# Patient Record
Sex: Female | Born: 1935 | Race: White | Hispanic: No | Smoking: Former smoker
Health system: Southern US, Community
[De-identification: ages and names within clinical notes are randomized; demographics above are authoritative.]

## PROBLEM LIST (undated history)

## (undated) DIAGNOSIS — K449 Diaphragmatic hernia without obstruction or gangrene: Secondary | ICD-10-CM

## (undated) DIAGNOSIS — K219 Gastro-esophageal reflux disease without esophagitis: Secondary | ICD-10-CM

## (undated) DIAGNOSIS — R0602 Shortness of breath: Secondary | ICD-10-CM

## (undated) DIAGNOSIS — K5790 Diverticulosis of intestine, part unspecified, without perforation or abscess without bleeding: Secondary | ICD-10-CM

## (undated) DIAGNOSIS — E559 Vitamin D deficiency, unspecified: Secondary | ICD-10-CM

## (undated) DIAGNOSIS — E039 Hypothyroidism, unspecified: Secondary | ICD-10-CM

## (undated) DIAGNOSIS — D510 Vitamin B12 deficiency anemia due to intrinsic factor deficiency: Secondary | ICD-10-CM

## (undated) DIAGNOSIS — R739 Hyperglycemia, unspecified: Secondary | ICD-10-CM

## (undated) DIAGNOSIS — I10 Essential (primary) hypertension: Secondary | ICD-10-CM

## (undated) DIAGNOSIS — E7800 Pure hypercholesterolemia, unspecified: Secondary | ICD-10-CM

## (undated) DIAGNOSIS — J449 Chronic obstructive pulmonary disease, unspecified: Secondary | ICD-10-CM

## (undated) DIAGNOSIS — M810 Age-related osteoporosis without current pathological fracture: Secondary | ICD-10-CM

## (undated) DIAGNOSIS — K279 Peptic ulcer, site unspecified, unspecified as acute or chronic, without hemorrhage or perforation: Secondary | ICD-10-CM

## (undated) DIAGNOSIS — M1990 Unspecified osteoarthritis, unspecified site: Secondary | ICD-10-CM

## (undated) DIAGNOSIS — J45909 Unspecified asthma, uncomplicated: Secondary | ICD-10-CM

## (undated) HISTORY — DX: Hyperglycemia, unspecified: R73.9

## (undated) HISTORY — PX: LUMBAR FUSION: SHX111

## (undated) HISTORY — DX: Diverticulosis of intestine, part unspecified, without perforation or abscess without bleeding: K57.90

## (undated) HISTORY — DX: Pure hypercholesterolemia, unspecified: E78.00

## (undated) HISTORY — PX: ABDOMINAL HYSTERECTOMY: SHX81

## (undated) HISTORY — DX: Chronic obstructive pulmonary disease, unspecified: J44.9

## (undated) HISTORY — DX: Vitamin B12 deficiency anemia due to intrinsic factor deficiency: D51.0

## (undated) HISTORY — DX: Hypothyroidism, unspecified: E03.9

## (undated) HISTORY — DX: Vitamin D deficiency, unspecified: E55.9

## (undated) HISTORY — DX: Peptic ulcer, site unspecified, unspecified as acute or chronic, without hemorrhage or perforation: K27.9

## (undated) HISTORY — DX: Gastro-esophageal reflux disease without esophagitis: K21.9

## (undated) HISTORY — DX: Age-related osteoporosis without current pathological fracture: M81.0

## (undated) HISTORY — DX: Unspecified osteoarthritis, unspecified site: M19.90

## (undated) HISTORY — DX: Diaphragmatic hernia without obstruction or gangrene: K44.9

## (undated) HISTORY — DX: Shortness of breath: R06.02

## (undated) HISTORY — DX: Essential (primary) hypertension: I10

## (undated) HISTORY — DX: Unspecified asthma, uncomplicated: J45.909

---

## 2004-09-23 ENCOUNTER — Ambulatory Visit: Payer: Self-pay | Admitting: Internal Medicine

## 2005-09-28 ENCOUNTER — Ambulatory Visit: Payer: Self-pay | Admitting: Internal Medicine

## 2006-11-22 ENCOUNTER — Ambulatory Visit: Payer: Self-pay | Admitting: Internal Medicine

## 2007-09-06 ENCOUNTER — Ambulatory Visit: Payer: Self-pay | Admitting: Unknown Physician Specialty

## 2008-01-29 ENCOUNTER — Ambulatory Visit: Payer: Self-pay | Admitting: Internal Medicine

## 2008-01-31 ENCOUNTER — Ambulatory Visit: Payer: Self-pay | Admitting: Internal Medicine

## 2009-02-11 ENCOUNTER — Ambulatory Visit: Payer: Self-pay | Admitting: Internal Medicine

## 2009-07-16 ENCOUNTER — Ambulatory Visit: Payer: Self-pay

## 2009-07-30 ENCOUNTER — Ambulatory Visit: Payer: Self-pay | Admitting: Pain Medicine

## 2009-08-14 ENCOUNTER — Ambulatory Visit: Payer: Self-pay | Admitting: Physician Assistant

## 2009-09-02 ENCOUNTER — Ambulatory Visit: Payer: Self-pay | Admitting: Pain Medicine

## 2009-09-18 ENCOUNTER — Ambulatory Visit: Payer: Self-pay | Admitting: Physician Assistant

## 2010-02-13 ENCOUNTER — Ambulatory Visit: Payer: Self-pay | Admitting: Internal Medicine

## 2010-02-23 ENCOUNTER — Ambulatory Visit: Payer: Self-pay | Admitting: Pain Medicine

## 2010-03-03 ENCOUNTER — Ambulatory Visit: Payer: Self-pay | Admitting: Pain Medicine

## 2010-03-18 ENCOUNTER — Ambulatory Visit: Payer: Self-pay | Admitting: Pain Medicine

## 2010-04-23 ENCOUNTER — Ambulatory Visit: Payer: Self-pay | Admitting: Pain Medicine

## 2010-05-07 ENCOUNTER — Ambulatory Visit: Payer: Self-pay | Admitting: Pain Medicine

## 2010-10-16 ENCOUNTER — Ambulatory Visit: Payer: Self-pay | Admitting: Internal Medicine

## 2011-02-15 ENCOUNTER — Ambulatory Visit: Payer: Self-pay | Admitting: Internal Medicine

## 2012-02-16 ENCOUNTER — Ambulatory Visit: Payer: Self-pay | Admitting: Internal Medicine

## 2012-09-18 ENCOUNTER — Ambulatory Visit (INDEPENDENT_AMBULATORY_CARE_PROVIDER_SITE_OTHER): Payer: Medicare Other | Admitting: Internal Medicine

## 2012-09-18 ENCOUNTER — Encounter: Payer: Self-pay | Admitting: Internal Medicine

## 2012-09-18 VITALS — BP 146/78 | HR 70 | Temp 98.6°F | Resp 18 | Ht 61.0 in | Wt 156.0 lb

## 2012-09-18 DIAGNOSIS — Z1331 Encounter for screening for depression: Secondary | ICD-10-CM

## 2012-09-18 DIAGNOSIS — E559 Vitamin D deficiency, unspecified: Secondary | ICD-10-CM

## 2012-09-18 DIAGNOSIS — R5383 Other fatigue: Secondary | ICD-10-CM

## 2012-09-18 DIAGNOSIS — E7800 Pure hypercholesterolemia, unspecified: Secondary | ICD-10-CM

## 2012-09-18 DIAGNOSIS — R7309 Other abnormal glucose: Secondary | ICD-10-CM

## 2012-09-18 DIAGNOSIS — E119 Type 2 diabetes mellitus without complications: Secondary | ICD-10-CM | POA: Insufficient documentation

## 2012-09-18 DIAGNOSIS — I10 Essential (primary) hypertension: Secondary | ICD-10-CM

## 2012-09-18 DIAGNOSIS — E039 Hypothyroidism, unspecified: Secondary | ICD-10-CM

## 2012-09-18 DIAGNOSIS — M549 Dorsalgia, unspecified: Secondary | ICD-10-CM

## 2012-09-18 DIAGNOSIS — R739 Hyperglycemia, unspecified: Secondary | ICD-10-CM

## 2012-09-18 DIAGNOSIS — K219 Gastro-esophageal reflux disease without esophagitis: Secondary | ICD-10-CM

## 2012-09-18 MED ORDER — LEVOTHYROXINE SODIUM 25 MCG PO TABS: 25.0000 ug | ORAL_TABLET | Freq: Every day | ORAL | Status: DC

## 2012-09-18 MED ORDER — AMLODIPINE BESYLATE 5 MG PO TABS: 5.0000 mg | ORAL_TABLET | Freq: Every day | ORAL | Status: DC

## 2012-09-18 MED ORDER — TRIAMTERENE-HCTZ 37.5-25 MG PO TABS: 1.0000 | ORAL_TABLET | Freq: Every day | ORAL | Status: DC

## 2012-09-18 NOTE — Assessment & Plan Note (Signed)
Symptoms controlled on current med regimen.  Follow.  

## 2012-09-18 NOTE — Assessment & Plan Note (Signed)
On synthroid.  Check tsh with next labs.   

## 2012-09-18 NOTE — Patient Instructions (Addendum)
It was nice seeing you today.  I am sorry you are still having trouble with your back/leg.  We will schedule an appt for you with Dr Yves Dill - to help with pain management.  Let me know if you need anything.

## 2012-09-18 NOTE — Assessment & Plan Note (Signed)
Low carb diet and exercise.  Urine microalbumin/cr ratio wnl 01/21/12.  Check met b and a1c.

## 2012-09-18 NOTE — Progress Notes (Signed)
  Subjective:    Patient ID: Alice Delgado, female    DOB: Mar 31, 1929, 77 y.o.   MRN: [id number]  HPI 78 year old female with past history of hypertension, hypercholesterolemia and hypothyroidism who comes in today for a scheduled follow up.  She states she has been doing relatively well.  She is still having increased back, left hip and left upper leg pain.  Has seen surgery.  Declines surgery.  Has been using hydrocodone (1/2 tablet - averages 5x/week).  She was questioning if an injection would help.  Desires not to pursue surgery.  Blood pressure averaging 140 systolic readings at home.  Bowels doing well.  Acid reflux controlled.  Breathing stable.   Past Medical History  Diagnosis Date  . Hypertension   . GERD (gastroesophageal reflux disease)   . Hypercholesterolemia   . Hyperglycemia   . Vitamin D deficiency   . Hypothyroidism   . Peptic ulcer disease   . Diverticulosis   . Pernicious anemia   . Osteoarthritis     lumbar disc dz, cervical disc dz, hands  . Osteoporosis     intolerance to fosamax, evista and miacalcin    Review of Systems Patient denies any headache, lightheadedness or dizziness.  No significant sinus or allergy symptoms.   No chest pain, tightness or palpitations.  No increased shortness of breath, cough or congestion.  No nausea or vomiting.  Acid reflux controlled on Nexium.   No abdominal pain or cramping.  No bowel change, such as diarrhea, constipation, BRBPR or melana.  No urine change.  Back and leg pain as outlined.        Objective:   Physical Exam Filed Vitals:   09/18/12 1328  BP: 146/78  Pulse: 70  Temp: 98.6 F (37 C)  Resp: 40   78 year old female in no acute distress.   HEENT:  Nares - clear.  OP- without lesions or erythema.  NECK:  Supple, nontender.  No audible bruit.   HEART:  Appears to be regular. LUNGS:  Without crackles or wheezing audible.  Respirations even and unlabored.   RADIAL PULSE:  Equal bilaterally.  ABDOMEN:   Soft, nontender.  No audible abdominal bruit.   EXTREMITIES:  No increased edema to be present.                     Assessment & Plan:  MSK.  Back and leg pain.  Persistent.  Has seen neurosurgery.  Desires not to pursue surgery.  Wants to try conservative measures.  Has the hydrocodone which she takes prn.  Will refer to Dr Yves Dill for evaluation and treatment.  MRI reveals severe lumbar spine disc degeneration with osteophyte formation, annular bulge and disc protrusion.  Diffuse facet hypertrophy.  Mild anterolisthesis of L4 on L5 that is most likely degenerative.  No high grade spinal stenosis.     GI.  Colonoscopy 09/06/07 revealed diverticulosis and non bleeding hemorrhoids.  Recommended follow up in five years.  Will need to schedule.    HEALTH MAINTENANCE.  Physical 09/22/11.  She is s/p hysterectomy.  Had pap - 09/22/11 - negative.  Colonoscopy as outlined.  Desires no further medication for her bones.  Continue calcium and vitamin D.  Last vitamin D level checked wnl.  Mammogram 02/16/12 - Birads II.

## 2012-09-18 NOTE — Assessment & Plan Note (Signed)
Blood pressure elevated today.  States it averages around 140 systolic readings at home.  Continue to spot check her pressure.  Get her back in soon to reassess.  Check metabolic panel.

## 2012-09-18 NOTE — Assessment & Plan Note (Signed)
Continue supplements.  Follow.   

## 2012-09-18 NOTE — Assessment & Plan Note (Addendum)
Low cholesterol diet and exercise.  Continues on Crestor.  Check lipid panel and liver function.

## 2012-09-22 ENCOUNTER — Other Ambulatory Visit (INDEPENDENT_AMBULATORY_CARE_PROVIDER_SITE_OTHER): Payer: Medicare Other

## 2012-09-22 DIAGNOSIS — R5381 Other malaise: Secondary | ICD-10-CM

## 2012-09-22 DIAGNOSIS — R5383 Other fatigue: Secondary | ICD-10-CM

## 2012-09-22 DIAGNOSIS — E7800 Pure hypercholesterolemia, unspecified: Secondary | ICD-10-CM

## 2012-09-22 DIAGNOSIS — E039 Hypothyroidism, unspecified: Secondary | ICD-10-CM

## 2012-09-22 DIAGNOSIS — R739 Hyperglycemia, unspecified: Secondary | ICD-10-CM

## 2012-09-22 DIAGNOSIS — R7309 Other abnormal glucose: Secondary | ICD-10-CM

## 2012-09-22 DIAGNOSIS — I10 Essential (primary) hypertension: Secondary | ICD-10-CM

## 2012-09-22 LAB — LIPID PANEL
Cholesterol: 158 mg/dL (ref 0–200)
LDL Cholesterol: 85 mg/dL (ref 0–99)
VLDL: 37.4 mg/dL (ref 0.0–40.0)

## 2012-09-22 LAB — CBC WITH DIFFERENTIAL/PLATELET
Basophils Relative: 0.5 % (ref 0.0–3.0)
Eosinophils Absolute: 0.4 10*3/uL (ref 0.0–0.7)
HCT: 38.5 % (ref 36.0–46.0)
Hemoglobin: 12.4 g/dL (ref 12.0–15.0)
Lymphocytes Relative: 22.5 % (ref 12.0–46.0)
MCHC: 32.1 g/dL (ref 30.0–36.0)
Neutro Abs: 3.4 10*3/uL (ref 1.4–7.7)
RBC: 4.63 Mil/uL (ref 3.87–5.11)

## 2012-09-22 LAB — HEPATIC FUNCTION PANEL
ALT: 14 U/L (ref 0–35)
Total Bilirubin: 0.8 mg/dL (ref 0.3–1.2)
Total Protein: 7.3 g/dL (ref 6.0–8.3)

## 2012-09-22 LAB — TSH: TSH: 2.25 u[IU]/mL (ref 0.35–5.50)

## 2012-09-22 LAB — BASIC METABOLIC PANEL
BUN: 16 mg/dL (ref 6–23)
CO2: 29 mEq/L (ref 19–32)
Chloride: 98 mEq/L (ref 96–112)
Creatinine, Ser: 1.3 mg/dL — ABNORMAL HIGH (ref 0.4–1.2)

## 2012-09-22 LAB — HEMOGLOBIN A1C: Hgb A1c MFr Bld: 6.5 % (ref 4.6–6.5)

## 2012-09-23 ENCOUNTER — Telehealth: Payer: Self-pay | Admitting: Internal Medicine

## 2012-09-23 DIAGNOSIS — N289 Disorder of kidney and ureter, unspecified: Secondary | ICD-10-CM

## 2012-09-23 NOTE — Telephone Encounter (Signed)
Pt notified of labs and need to stay hydrated.  Will recheck lab and urine 10/05/12 - 11:00.  I have placed order and pt aware of appt - please put on lab schedule.  Thanks.

## 2012-09-26 ENCOUNTER — Other Ambulatory Visit: Payer: Medicare Other

## 2012-09-26 NOTE — Telephone Encounter (Signed)
Appointment made

## 2012-09-27 ENCOUNTER — Other Ambulatory Visit: Payer: Medicare Other

## 2012-10-05 ENCOUNTER — Other Ambulatory Visit (INDEPENDENT_AMBULATORY_CARE_PROVIDER_SITE_OTHER): Payer: Medicare Other

## 2012-10-05 DIAGNOSIS — N289 Disorder of kidney and ureter, unspecified: Secondary | ICD-10-CM

## 2012-10-05 LAB — BASIC METABOLIC PANEL
BUN: 17 mg/dL (ref 6–23)
CO2: 27 mEq/L (ref 19–32)
Calcium: 9.4 mg/dL (ref 8.4–10.5)
Chloride: 99 mEq/L (ref 96–112)
GFR: 41.36 mL/min — ABNORMAL LOW (ref >60.00)
Glucose, Bld: 112 mg/dL — ABNORMAL HIGH (ref 70–99)
Potassium: 3.7 mEq/L (ref 3.5–5.1)
Sodium: 136 mEq/L (ref 135–145)

## 2012-10-05 LAB — URINALYSIS, ROUTINE W REFLEX MICROSCOPIC
Bilirubin Urine: NEGATIVE
Ketones, ur: NEGATIVE
Leukocytes, UA: NEGATIVE
Urobilinogen, UA: 0.2 (ref 0.0–1.0)

## 2012-11-23 ENCOUNTER — Ambulatory Visit (INDEPENDENT_AMBULATORY_CARE_PROVIDER_SITE_OTHER): Payer: Medicare Other | Admitting: Internal Medicine

## 2012-11-23 ENCOUNTER — Encounter: Payer: Self-pay | Admitting: Internal Medicine

## 2012-11-23 VITALS — BP 160/80 | HR 80 | Temp 98.5°F | Ht 61.0 in | Wt 148.1 lb

## 2012-11-23 DIAGNOSIS — Z139 Encounter for screening, unspecified: Secondary | ICD-10-CM

## 2012-11-23 DIAGNOSIS — R7309 Other abnormal glucose: Secondary | ICD-10-CM

## 2012-11-23 DIAGNOSIS — K219 Gastro-esophageal reflux disease without esophagitis: Secondary | ICD-10-CM

## 2012-11-23 DIAGNOSIS — R739 Hyperglycemia, unspecified: Secondary | ICD-10-CM

## 2012-11-23 DIAGNOSIS — I10 Essential (primary) hypertension: Secondary | ICD-10-CM

## 2012-11-23 DIAGNOSIS — E7800 Pure hypercholesterolemia, unspecified: Secondary | ICD-10-CM

## 2012-11-23 NOTE — Assessment & Plan Note (Signed)
Controlled.  Follow.   

## 2012-11-23 NOTE — Assessment & Plan Note (Signed)
Low cholesterol diet and exercise.   

## 2012-11-23 NOTE — Assessment & Plan Note (Signed)
Blood pressure elevated today.  May be related to her back issues.  Will have her monitor her blood pressures.  Hold on making medication changes.  Get her back in soon to reassess.

## 2012-11-23 NOTE — Progress Notes (Signed)
Subjective:    Patient ID: Alice Delgado, female    DOB: 29-Dec-1928, 79 y.o.   MRN: [id number]  HPI 79 year old female with past history of hypertension, hypercholesterolemia and hypothyroidism who comes in today to follow up on these issues as well as for a complete physical exam.  She is still having increased back, left hip and left upper leg pain.  Saw Dr Yves Dill.  Had an epidural.  Did not tolerate the first epidural.  Kept her up all night.  Some nausea.  After the injection, she noticed the pain went across her lower back.  Had follow up with Dr Yves Dill 11/09/12.  Was placed on oral medication which caused emesis and nausea.  This medication also kept her awake.  After stopping the medication, is sleeping better.  Has seen surgery.  Declines surgery.  Has been using hydrocodone prn.  She did report the numbness and stinging was better after the injection, but she is still having the pain.  Is planning to follow up with Dr Yves Dill tomorrow.  States other than her back, she feels she is doing well.  Breathing stable.      Past Medical History  Diagnosis Date  . Hypertension   . GERD (gastroesophageal reflux disease)   . Hypercholesterolemia   . Hyperglycemia   . Vitamin D deficiency   . Hypothyroidism   . Peptic ulcer disease   . Diverticulosis   . Pernicious anemia   . Osteoarthritis     lumbar disc dz, cervical disc dz, hands  . Osteoporosis     intolerance to fosamax, evista and miacalcin    Current Outpatient Prescriptions on File Prior to Visit  Medication Sig Dispense Refill  . acetaminophen (TYLENOL) 325 MG tablet Take 650 mg by mouth every 6 (six) hours as needed.      Marland Kitchen amLODipine (NORVASC) 5 MG tablet Take 1 tablet (5 mg total) by mouth daily.  90 tablet  3  . cetirizine (ZYRTEC) 10 MG tablet Take 10 mg by mouth daily.      . cyanocobalamin (,VITAMIN B-12,) 1000 MCG/ML injection Inject 1,000 mcg into the muscle every 30 (thirty) days.      Marland Kitchen esomeprazole (NEXIUM) 40  MG capsule Take 40 mg by mouth daily.      Marland Kitchen HYDROcodone-acetaminophen (VICODIN) 5-500 MG per tablet Take 1 tablet by mouth every 6 (six) hours as needed.      Marland Kitchen levothyroxine (SYNTHROID, LEVOTHROID) 25 MCG tablet Take 1 tablet (25 mcg total) by mouth daily.  90 tablet  3  . rosuvastatin (CRESTOR) 5 MG tablet Take 5 mg by mouth 3 (three) times a week.      . triamterene-hydrochlorothiazide (MAXZIDE-25) 37.5-25 MG per tablet Take 1 each (1 tablet total) by mouth daily.  90 tablet  3    Review of Systems Patient denies any headache, lightheadedness or dizziness.  No significant sinus or allergy symptoms.   No chest pain, tightness or palpitations.  No increased shortness of breath, cough or congestion.  No nausea or vomiting.  Acid reflux controlled on Nexium.   No abdominal pain or cramping.  No bowel change, such as diarrhea, constipation, BRBPR or melana.  No urine change.  Back and leg pain as outlined.         Objective:   Physical Exam  Filed Vitals:   11/23/12 1348  BP: 160/80  Pulse: 80  Temp: 98.5 F (36.9 C)   Blood pressure recheck: 160/82  79 year  old female in no acute distress.   HEENT:  Nares- clear.  Oropharynx - without lesions. NECK:  Supple.  Nontender.  No audible bruit.  HEART:  Appears to be regular. LUNGS:  No crackles or wheezing audible.  Respirations even and unlabored.  RADIAL PULSE:  Equal bilaterally.    BREASTS:  No nipple discharge or nipple retraction present.  Could not appreciate any distinct nodules or axillary adenopathy.  ABDOMEN:  Soft, nontender.  Bowel sounds present and normal.  No audible abdominal bruit.  GU:  Not performed.    EXTREMITIES:  No increased edema present.  DP pulses palpable and equal bilaterally.            Assessment & Plan:  MSK.  Back and leg pain.  Persistent.  Has seen neurosurgery.  Desires not to pursue surgery.   MRI reveals severe lumbar spine disc degeneration with osteophyte formation, annular bulge and disc  protrusion.  Diffuse facet hypertrophy.  Mild anterolisthesis of L4 on L5 that is most likely degenerative. No high grade spinal stenosis.  Intolerance to first epidural.  Intolerant to the medication.  Planning to see Dr Yves Dill tomorrow.     GI.  Colonoscopy 09/06/07 revealed diverticulosis and non bleeding hemorrhoids.  Recommended follow up in five years.  Will need to schedule.  Will need to sort through her back issues.    HEALTH MAINTENANCE.  Physical today.  She is s/p hysterectomy.  Had pap - 09/22/11 - negative.  Colonoscopy as outlined.  Desires no further medication for her bones.  Continue calcium and vitamin D.  Last vitamin D level checked wnl.  Mammogram 02/16/12 - Birads II.  Schedule a follow up mammogram.

## 2012-11-23 NOTE — Assessment & Plan Note (Signed)
Low carb diet.  Follow met b and a1c.   

## 2013-01-04 ENCOUNTER — Telehealth: Payer: Self-pay | Admitting: Internal Medicine

## 2013-01-04 NOTE — Telephone Encounter (Signed)
Pt states her pharmacy did not have the Nexium refills available for her and told her she would have to contact us.  Pt states she had a CPE in February and requested this to Dr. Lorin Picket.  Pt next appt not until 4/15 and was wondering if Dr. Lorin Picket could call the Nexium in for her without her having to come in for another appointment and cannot wait until 4/15 for the refill.  Please advise.

## 2013-01-05 ENCOUNTER — Other Ambulatory Visit: Payer: Self-pay | Admitting: *Deleted

## 2013-01-05 MED ORDER — ESOMEPRAZOLE MAGNESIUM 40 MG PO CPDR: 40.0000 mg | DELAYED_RELEASE_CAPSULE | Freq: Every day | ORAL | Status: DC

## 2013-01-05 NOTE — Telephone Encounter (Signed)
Sent in to pharmacy.  

## 2013-01-23 ENCOUNTER — Other Ambulatory Visit (INDEPENDENT_AMBULATORY_CARE_PROVIDER_SITE_OTHER): Payer: Medicare Other

## 2013-01-23 DIAGNOSIS — R739 Hyperglycemia, unspecified: Secondary | ICD-10-CM

## 2013-01-23 DIAGNOSIS — R7309 Other abnormal glucose: Secondary | ICD-10-CM

## 2013-01-23 DIAGNOSIS — E7800 Pure hypercholesterolemia, unspecified: Secondary | ICD-10-CM

## 2013-01-23 DIAGNOSIS — I10 Essential (primary) hypertension: Secondary | ICD-10-CM

## 2013-01-23 LAB — BASIC METABOLIC PANEL
BUN: 16 mg/dL (ref 6–23)
Chloride: 99 mEq/L (ref 96–112)
Creatinine, Ser: 1.3 mg/dL — ABNORMAL HIGH (ref 0.4–1.2)
GFR: 40.97 mL/min — ABNORMAL LOW (ref >60.00)
Potassium: 3.8 mEq/L (ref 3.5–5.1)

## 2013-01-23 LAB — LIPID PANEL
Cholesterol: 192 mg/dL (ref 0–200)
LDL Cholesterol: 122 mg/dL — ABNORMAL HIGH (ref 0–99)
Triglycerides: 194 mg/dL — ABNORMAL HIGH (ref 0.0–149.0)
VLDL: 38.8 mg/dL (ref 0.0–40.0)

## 2013-01-30 ENCOUNTER — Encounter: Payer: Self-pay | Admitting: Internal Medicine

## 2013-01-30 ENCOUNTER — Ambulatory Visit (INDEPENDENT_AMBULATORY_CARE_PROVIDER_SITE_OTHER): Payer: Medicare Other | Admitting: Internal Medicine

## 2013-01-30 VITALS — BP 138/80 | HR 79 | Temp 98.3°F

## 2013-01-30 DIAGNOSIS — E039 Hypothyroidism, unspecified: Secondary | ICD-10-CM

## 2013-01-30 DIAGNOSIS — K219 Gastro-esophageal reflux disease without esophagitis: Secondary | ICD-10-CM

## 2013-01-30 DIAGNOSIS — E559 Vitamin D deficiency, unspecified: Secondary | ICD-10-CM

## 2013-01-30 DIAGNOSIS — E119 Type 2 diabetes mellitus without complications: Secondary | ICD-10-CM

## 2013-01-30 DIAGNOSIS — E7800 Pure hypercholesterolemia, unspecified: Secondary | ICD-10-CM

## 2013-01-30 DIAGNOSIS — R739 Hyperglycemia, unspecified: Secondary | ICD-10-CM

## 2013-01-30 DIAGNOSIS — I10 Essential (primary) hypertension: Secondary | ICD-10-CM

## 2013-01-30 DIAGNOSIS — M25552 Pain in left hip: Secondary | ICD-10-CM

## 2013-01-30 DIAGNOSIS — M25559 Pain in unspecified hip: Secondary | ICD-10-CM

## 2013-01-30 DIAGNOSIS — R7309 Other abnormal glucose: Secondary | ICD-10-CM

## 2013-02-04 ENCOUNTER — Encounter: Payer: Self-pay | Admitting: Internal Medicine

## 2013-02-04 NOTE — Assessment & Plan Note (Signed)
Controlled.  Follow.   

## 2013-02-04 NOTE — Assessment & Plan Note (Signed)
Low carb diet.  Follow met b and a1c.  A1c just checked 01/23/13 - 6.4.

## 2013-02-04 NOTE — Progress Notes (Signed)
Subjective:    Patient ID: Alice Delgado, female    DOB: [birth year], 79 y.o.   MRN: [id number]  HPI 79 year old female with past history of hypertension, hypercholesterolemia and hypothyroidism who comes in today for a scheduled follow up.  She is still having increased back, left hip and left upper leg pain.  Saw Dr Yves Dill.  Had an epidural.  Did not tolerate the first epidural.  Kept her up all night.  Some nausea.  After the injection, she noticed the pain went across her lower back.  Had follow up with Dr Yves Dill 11/09/12.  Was placed on oral medication which caused emesis and nausea.  This medication also kept her awake.  After stopping the medication, is sleeping better.  Has seen surgery.  Declines surgery.  Has been using hydrocodone prn.  She did report the numbness and stinging was better after the injection, but she is still having the pain.  She request a referral to Dr Erin Sons for evaluation of her left hip.  States she feels an injection in the hip would improve some of her pain. States other than her back, she feels she is doing well.  Breathing stable.      Past Medical History  Diagnosis Date  . Hypertension   . GERD (gastroesophageal reflux disease)   . Hypercholesterolemia   . Hyperglycemia   . Vitamin D deficiency   . Hypothyroidism   . Peptic ulcer disease   . Diverticulosis   . Pernicious anemia   . Osteoarthritis     lumbar disc dz, cervical disc dz, hands  . Osteoporosis     intolerance to fosamax, evista and miacalcin    Current Outpatient Prescriptions on File Prior to Visit  Medication Sig Dispense Refill  . acetaminophen (TYLENOL) 325 MG tablet Take 650 mg by mouth every 6 (six) hours as needed.      Marland Kitchen amLODipine (NORVASC) 5 MG tablet Take 1 tablet (5 mg total) by mouth daily.  90 tablet  3  . cetirizine (ZYRTEC) 10 MG tablet Take 10 mg by mouth daily.      . cyanocobalamin (,VITAMIN B-12,) 1000 MCG/ML injection Inject 1,000 mcg into the muscle  every 30 (thirty) days.      Marland Kitchen esomeprazole (NEXIUM) 40 MG capsule Take 1 capsule (40 mg total) by mouth daily.  30 capsule  5  . HYDROcodone-acetaminophen (VICODIN) 5-500 MG per tablet Take 1 tablet by mouth every 6 (six) hours as needed.      Marland Kitchen levothyroxine (SYNTHROID, LEVOTHROID) 25 MCG tablet Take 1 tablet (25 mcg total) by mouth daily.  90 tablet  3  . rosuvastatin (CRESTOR) 5 MG tablet Take 5 mg by mouth 3 (three) times a week.      . triamterene-hydrochlorothiazide (MAXZIDE-25) 37.5-25 MG per tablet Take 1 each (1 tablet total) by mouth daily.  90 tablet  3   No current facility-administered medications on file prior to visit.    Review of Systems Patient denies any headache, lightheadedness or dizziness.  No significant sinus or allergy symptoms.   No chest pain, tightness or palpitations.  No increased shortness of breath, cough or congestion.  No nausea or vomiting.  Acid reflux controlled on Nexium.   No abdominal pain or cramping.  No bowel change, such as diarrhea, constipation, BRBPR or melana.  No urine change.  Back and leg pain as outlined.   Request referral to Dr HBK to see if she would benefit from an injection  in her hip.  She feels this would help her pain.       Objective:   Physical Exam  Filed Vitals:   01/30/13 1117  BP: 138/80  Pulse: 79  Temp: 98.3 F (36.8 C)   Blood pressure recheck: 21/67  79 year old female in no acute distress.   HEENT:  Nares- clear.  Oropharynx - without lesions. NECK:  Supple.  Nontender.  No audible bruit.  HEART:  Appears to be regular. LUNGS:  No crackles or wheezing audible.  Respirations even and unlabored.  RADIAL PULSE:  Equal bilaterally.    ABDOMEN:  Soft, nontender.  Bowel sounds present and normal.  No audible abdominal bruit.  EXTREMITIES:  No increased edema present.  DP pulses palpable and equal bilaterally.            Assessment & Plan:  MSK.  Back and leg pain.  Persistent.  Has seen neurosurgery.  Desires not  to pursue surgery.   MRI reveals severe lumbar spine disc degeneration with osteophyte formation, annular bulge and disc protrusion.  Diffuse facet hypertrophy.  Mild anterolisthesis of L4 on L5 that is most likely degenerative. No high grade spinal stenosis.  Intolerance to first epidural.  Intolerant to the medication.  Saw Dr Yves Dill.  Epidural "too much" for her.  Had some intolerance.  Request referral to Dr Erin Sons for left hip injection, just to see if this would improve some of her pain.   GI.  Colonoscopy 09/06/07 revealed diverticulosis and non bleeding hemorrhoids.  Recommended follow up in five years.  Will need to schedule.  Will need to sort through her back issues.    HEALTH MAINTENANCE.  Physical 11/23/12.  She is s/p hysterectomy.  Had pap - 09/22/11 - negative. Colonoscopy as outlined.  Desires no further medication for her bones.  Continue calcium and vitamin D.  Last vitamin D level checked wnl.  Mammogram 02/16/12 - Birads II.  Schedule a follow up mammogram.

## 2013-02-04 NOTE — Assessment & Plan Note (Signed)
On synthroid.  Follow tsh.   

## 2013-02-04 NOTE — Assessment & Plan Note (Signed)
Continue supplements.  Follow.   

## 2013-02-04 NOTE — Assessment & Plan Note (Addendum)
Blood pressure on her outside checks averaging 146/70s.  Same med regimen. Follow.  Follow metabolic panel.  Cr 1.3.  Follow.

## 2013-02-04 NOTE — Assessment & Plan Note (Signed)
Low cholesterol diet and exercise.  01/23/13 lipid panel revealed total cholesterol 192, triglycerides 194, HDL 31 and LDL 122.  Increased some from last.  Follow.

## 2013-02-05 ENCOUNTER — Encounter: Payer: Self-pay | Admitting: Emergency Medicine

## 2013-02-16 ENCOUNTER — Ambulatory Visit: Payer: Self-pay | Admitting: Internal Medicine

## 2013-02-16 LAB — HM MAMMOGRAPHY

## 2013-02-17 ENCOUNTER — Encounter: Payer: Self-pay | Admitting: Internal Medicine

## 2013-03-23 ENCOUNTER — Encounter: Payer: Self-pay | Admitting: Internal Medicine

## 2013-04-11 ENCOUNTER — Ambulatory Visit: Payer: Self-pay | Admitting: Anesthesiology

## 2013-04-12 ENCOUNTER — Ambulatory Visit: Payer: Self-pay | Admitting: Anesthesiology

## 2013-05-28 ENCOUNTER — Ambulatory Visit: Payer: Self-pay | Admitting: Anesthesiology

## 2013-05-29 ENCOUNTER — Other Ambulatory Visit (INDEPENDENT_AMBULATORY_CARE_PROVIDER_SITE_OTHER): Payer: Medicare Other

## 2013-05-29 DIAGNOSIS — E7800 Pure hypercholesterolemia, unspecified: Secondary | ICD-10-CM

## 2013-05-29 DIAGNOSIS — E119 Type 2 diabetes mellitus without complications: Secondary | ICD-10-CM

## 2013-05-29 DIAGNOSIS — I10 Essential (primary) hypertension: Secondary | ICD-10-CM

## 2013-05-29 LAB — HEPATIC FUNCTION PANEL
AST: 18 U/L (ref 0–37)
Alkaline Phosphatase: 68 U/L (ref 39–117)
Bilirubin, Direct: 0.1 mg/dL (ref 0.0–0.3)
Total Bilirubin: 0.7 mg/dL (ref 0.3–1.2)

## 2013-05-29 LAB — BASIC METABOLIC PANEL
BUN: 13 mg/dL (ref 6–23)
Calcium: 9.6 mg/dL (ref 8.4–10.5)
Creatinine, Ser: 1.3 mg/dL — ABNORMAL HIGH (ref 0.4–1.2)
GFR: 43.19 mL/min — ABNORMAL LOW (ref >60.00)
Glucose, Bld: 112 mg/dL — ABNORMAL HIGH (ref 70–99)

## 2013-05-29 LAB — LIPID PANEL
HDL: 40.3 mg/dL (ref >39.00)
Total CHOL/HDL Ratio: 5

## 2013-06-01 ENCOUNTER — Encounter: Payer: Self-pay | Admitting: Internal Medicine

## 2013-06-01 ENCOUNTER — Ambulatory Visit (INDEPENDENT_AMBULATORY_CARE_PROVIDER_SITE_OTHER): Payer: Medicare Other | Admitting: Internal Medicine

## 2013-06-01 VITALS — BP 140/70 | HR 93 | Temp 99.3°F | Ht 61.0 in | Wt 149.5 lb

## 2013-06-01 DIAGNOSIS — E039 Hypothyroidism, unspecified: Secondary | ICD-10-CM

## 2013-06-01 DIAGNOSIS — I10 Essential (primary) hypertension: Secondary | ICD-10-CM

## 2013-06-01 DIAGNOSIS — E559 Vitamin D deficiency, unspecified: Secondary | ICD-10-CM

## 2013-06-01 DIAGNOSIS — E119 Type 2 diabetes mellitus without complications: Secondary | ICD-10-CM

## 2013-06-01 DIAGNOSIS — K219 Gastro-esophageal reflux disease without esophagitis: Secondary | ICD-10-CM

## 2013-06-01 DIAGNOSIS — E7800 Pure hypercholesterolemia, unspecified: Secondary | ICD-10-CM

## 2013-06-01 MED ORDER — LISINOPRIL 10 MG PO TABS: 10.0000 mg | ORAL_TABLET | Freq: Every day | ORAL | Status: DC

## 2013-06-03 ENCOUNTER — Encounter: Payer: Self-pay | Admitting: Internal Medicine

## 2013-06-03 NOTE — Assessment & Plan Note (Signed)
Continue supplements.  Follow.   

## 2013-06-03 NOTE — Assessment & Plan Note (Signed)
A1c just checked 6.5.  Sees Dr Dingledein for her eye exams.  Low carb diet.  Follow.

## 2013-06-03 NOTE — Assessment & Plan Note (Signed)
Low cholesterol diet and exercise. 05/29/13 lipid panel revealed total cholesterol 195, triglycerides 164, HDL401 and LDL 122.  Improved some.  Follow.

## 2013-06-03 NOTE — Assessment & Plan Note (Signed)
On synthroid.  Follow tsh.   

## 2013-06-03 NOTE — Assessment & Plan Note (Signed)
Controlled.  Follow.   

## 2013-06-03 NOTE — Progress Notes (Signed)
Subjective:    Patient ID: Alice Delgado, female    DOB: [birth year], 79 y.o.   MRN: [id number]  HPI 79 year old female with past history of hypertension, hypercholesterolemia and hypothyroidism who comes in today for a scheduled follow up.  She had been having increased back, left hip and left upper leg pain.  Saw Dr Yves Dill.  Has seen surgery.  Declines surgery.  Had been using hydrocodone prn.  She saw Dr Gavin Potters.  Had a hip injection.  Helped for a while.  He referred her to Dr Pernell Dupre at the pain clinic.  Had epidural x 1.  Able to straighten up now.  Helped. Planning for another injection soon.  States her blood pressures have been elevated recently.  systolics remaining in the 140-150s.  She also reports she ate corn this week.  Started having some lower abdominal discomfort.  No fever or chills.  Not severe.  No significant bowel change.  States has occurred previously and resolves on its own.     Past Medical History  Diagnosis Date  . Hypertension   . GERD (gastroesophageal reflux disease)   . Hypercholesterolemia   . Hyperglycemia   . Vitamin D deficiency   . Hypothyroidism   . Peptic ulcer disease   . Diverticulosis   . Pernicious anemia   . Osteoarthritis     lumbar disc dz, cervical disc dz, hands  . Osteoporosis     intolerance to fosamax, evista and miacalcin    Current Outpatient Prescriptions on File Prior to Visit  Medication Sig Dispense Refill  . acetaminophen (TYLENOL) 325 MG tablet Take 650 mg by mouth every 6 (six) hours as needed.      Marland Kitchen amLODipine (NORVASC) 5 MG tablet Take 1 tablet (5 mg total) by mouth daily.  90 tablet  3  . cetirizine (ZYRTEC) 10 MG tablet Take 10 mg by mouth daily.      . cyanocobalamin (,VITAMIN B-12,) 1000 MCG/ML injection Inject 1,000 mcg into the muscle every 30 (thirty) days.      Marland Kitchen esomeprazole (NEXIUM) 40 MG capsule Take 1 capsule (40 mg total) by mouth daily.  30 capsule  5  . HYDROcodone-acetaminophen (VICODIN) 5-500 MG per  tablet Take 1 tablet by mouth every 6 (six) hours as needed.      Marland Kitchen levothyroxine (SYNTHROID, LEVOTHROID) 25 MCG tablet Take 1 tablet (25 mcg total) by mouth daily.  90 tablet  3  . rosuvastatin (CRESTOR) 5 MG tablet Take 5 mg by mouth 3 (three) times a week.      . triamterene-hydrochlorothiazide (MAXZIDE-25) 37.5-25 MG per tablet Take 1 each (1 tablet total) by mouth daily.  90 tablet  3   No current facility-administered medications on file prior to visit.    Review of Systems Patient denies any headache, lightheadedness or dizziness.  No significant sinus or allergy symptoms.   No chest pain, tightness or palpitations.  No increased shortness of breath, cough or congestion.  Breathing stable.  No nausea or vomiting.  Acid reflux controlled on Nexium.   Some lower abdominal discomfort as outlined.  No bowel change, such as diarrhea, constipation, BRBPR or melana.  No urine change.  Back and leg pain as outlined.  Better.  Seeing Dr Pernell Dupre now.       Objective:   Physical Exam  Filed Vitals:   06/01/13 1029  BP: 140/70  Pulse: 93  Temp: 99.3 F (37.4 C)   Blood pressure recheck: 154/74, pulse 96  79 year old female in no acute distress.   HEENT:  Nares- clear.  Oropharynx - without lesions. NECK:  Supple.  Nontender.  No audible bruit.  HEART:  Appears to be regular. LUNGS:  No crackles or wheezing audible.  Respirations even and unlabored.  RADIAL PULSE:  Equal bilaterally.    ABDOMEN:  Soft.  No significant tenderness to palpation.  No rebound or guarding.   Bowel sounds present and normal.  No audible abdominal bruit.  EXTREMITIES:  No increased edema present.  DP pulses palpable and equal bilaterally.  FEET:  Without lesions.            Assessment & Plan:  MSK.  Back and leg pain.  Persistent.  Has seen neurosurgery.  Desires not to pursue surgery.   MRI reveals severe lumbar spine disc degeneration with osteophyte formation, annular bulge and disc protrusion.  Diffuse facet  hypertrophy.  Mild anterolisthesis of L4 on L5 that is most likely degenerative. No high grade spinal stenosis.  Intolerant to the medication.  Saw Dr Gavin Potters.  Hip injection helped some.  Now seeing Dr Pernell Dupre.  Epidural helped.  Planning to follow up soon.    GI.  Colonoscopy 09/06/07 revealed diverticulosis and non bleeding hemorrhoids.  Recommended follow up in five years.  States received notice from GI.  No repeat colonoscopy warranted.  She desires not to pursue.  Does have the minimal lower abdominal discomfort currently.  Probable mild diverticular flare.  No significant pain to palpation.  She is eating and drinking.  Bowels ok.  Will hold on abx.  Follow closely.  Notify me if symptoms do not continue to improve and resolve.      HEALTH MAINTENANCE.  Physical 11/23/12.  She is s/p hysterectomy.  Had pap - 09/22/11 - negative. Colonoscopy as outlined.  Desires no further medication for her bones.  Continue calcium and vitamin D.  Last vitamin D level checked wnl.  Mammogram 02/16/13 - Birads II.

## 2013-06-03 NOTE — Assessment & Plan Note (Signed)
Blood pressure on her outside checks averaging 140-150s.   Add lisinopril 10mg  q day.  Check metabolic panel within 1-2 weeks.  Follow renal function closely.  Have her spot check her pressures.  Get her back in soon to reassess.

## 2013-06-15 ENCOUNTER — Other Ambulatory Visit (INDEPENDENT_AMBULATORY_CARE_PROVIDER_SITE_OTHER): Payer: Medicare Other

## 2013-06-15 DIAGNOSIS — I10 Essential (primary) hypertension: Secondary | ICD-10-CM

## 2013-06-15 LAB — BASIC METABOLIC PANEL
CO2: 28 mEq/L (ref 19–32)
Calcium: 9.5 mg/dL (ref 8.4–10.5)
Creatinine, Ser: 1.3 mg/dL — ABNORMAL HIGH (ref 0.4–1.2)
Glucose, Bld: 94 mg/dL (ref 70–99)

## 2013-06-18 ENCOUNTER — Other Ambulatory Visit: Payer: Self-pay | Admitting: Internal Medicine

## 2013-06-18 DIAGNOSIS — E871 Hypo-osmolality and hyponatremia: Secondary | ICD-10-CM

## 2013-06-18 NOTE — Progress Notes (Signed)
Order placed for f/u sodium.  ?

## 2013-06-20 ENCOUNTER — Encounter: Payer: Self-pay | Admitting: *Deleted

## 2013-06-25 ENCOUNTER — Ambulatory Visit: Payer: Self-pay | Admitting: Anesthesiology

## 2013-06-29 ENCOUNTER — Other Ambulatory Visit: Payer: Medicare Other

## 2013-07-03 ENCOUNTER — Encounter: Payer: Self-pay | Admitting: Internal Medicine

## 2013-07-03 ENCOUNTER — Ambulatory Visit (INDEPENDENT_AMBULATORY_CARE_PROVIDER_SITE_OTHER): Payer: Medicare Other | Admitting: Internal Medicine

## 2013-07-03 VITALS — BP 120/70 | HR 66 | Temp 98.7°F | Ht 61.0 in | Wt 148.2 lb

## 2013-07-03 DIAGNOSIS — N289 Disorder of kidney and ureter, unspecified: Secondary | ICD-10-CM

## 2013-07-03 DIAGNOSIS — E119 Type 2 diabetes mellitus without complications: Secondary | ICD-10-CM

## 2013-07-03 DIAGNOSIS — E039 Hypothyroidism, unspecified: Secondary | ICD-10-CM

## 2013-07-03 DIAGNOSIS — K219 Gastro-esophageal reflux disease without esophagitis: Secondary | ICD-10-CM

## 2013-07-03 DIAGNOSIS — E871 Hypo-osmolality and hyponatremia: Secondary | ICD-10-CM

## 2013-07-03 DIAGNOSIS — I10 Essential (primary) hypertension: Secondary | ICD-10-CM

## 2013-07-03 DIAGNOSIS — E559 Vitamin D deficiency, unspecified: Secondary | ICD-10-CM

## 2013-07-03 DIAGNOSIS — E7800 Pure hypercholesterolemia, unspecified: Secondary | ICD-10-CM

## 2013-07-04 ENCOUNTER — Other Ambulatory Visit: Payer: Self-pay | Admitting: Internal Medicine

## 2013-07-04 DIAGNOSIS — E871 Hypo-osmolality and hyponatremia: Secondary | ICD-10-CM

## 2013-07-04 NOTE — Progress Notes (Signed)
Order placed for f/u sodium.  ?

## 2013-07-05 ENCOUNTER — Ambulatory Visit: Payer: Self-pay | Admitting: Ophthalmology

## 2013-07-05 ENCOUNTER — Encounter: Payer: Self-pay | Admitting: Internal Medicine

## 2013-07-05 LAB — HEMOGLOBIN: HGB: 12 g/dL (ref 12.0–16.0)

## 2013-07-05 LAB — POTASSIUM: Potassium: 3.9 mmol/L (ref 3.5–5.1)

## 2013-07-05 NOTE — Assessment & Plan Note (Signed)
A1c just checked 6.5.  Sees Dr Dingledein for her eye exams.  Low carb diet.  Follow. 

## 2013-07-05 NOTE — Assessment & Plan Note (Signed)
Continue supplements.  Follow.   

## 2013-07-05 NOTE — Assessment & Plan Note (Signed)
Low cholesterol diet and exercise.  Follow lipid panel.   

## 2013-07-05 NOTE — Assessment & Plan Note (Signed)
On synthroid.  Follow tsh.   

## 2013-07-05 NOTE — Progress Notes (Signed)
Subjective:    Patient ID: Alice Delgado, female    DOB: 1928/12/15, 79 y.o.   MRN: [id number]  HPI 79 year old female with past history of hypertension, hypercholesterolemia and hypothyroidism who comes in today for a scheduled follow up.  She had been having increased back, left hip and left upper leg pain.  Saw Dr Yves Dill.  Has seen surgery.  Declines surgery.  Had been using hydrocodone prn.  She saw Dr Gavin Potters.  Had a hip injection.  Helped for a while.  He referred her to Dr Pernell Dupre at the pain clinic.  Had epidural another epidural last week.  Planning for the third epidural 10/14.  States her blood pressures have been doing better.  Tolerating the lisinopril.  Started last visit.  The abdominal pain is better.  Still with some occasional lower abdominal discomfort.  No fever or chills. No bowel change.  Eating and drinking well.      Past Medical History  Diagnosis Date  . Hypertension   . GERD (gastroesophageal reflux disease)   . Hypercholesterolemia   . Hyperglycemia   . Vitamin D deficiency   . Hypothyroidism   . Peptic ulcer disease   . Diverticulosis   . Pernicious anemia   . Osteoarthritis     lumbar disc dz, cervical disc dz, hands  . Osteoporosis     intolerance to fosamax, evista and miacalcin    Current Outpatient Prescriptions on File Prior to Visit  Medication Sig Dispense Refill  . acetaminophen (TYLENOL) 325 MG tablet Take 650 mg by mouth every 6 (six) hours as needed.      Marland Kitchen amLODipine (NORVASC) 5 MG tablet Take 1 tablet (5 mg total) by mouth daily.  90 tablet  3  . cetirizine (ZYRTEC) 10 MG tablet Take 10 mg by mouth daily.      . cyanocobalamin (,VITAMIN B-12,) 1000 MCG/ML injection Inject 1,000 mcg into the muscle every 30 (thirty) days.      Marland Kitchen esomeprazole (NEXIUM) 40 MG capsule Take 1 capsule (40 mg total) by mouth daily.  30 capsule  5  . HYDROcodone-acetaminophen (VICODIN) 5-500 MG per tablet Take 1 tablet by mouth every 6 (six) hours as needed.       Marland Kitchen levothyroxine (SYNTHROID, LEVOTHROID) 25 MCG tablet Take 1 tablet (25 mcg total) by mouth daily.  90 tablet  3  . lisinopril (PRINIVIL,ZESTRIL) 10 MG tablet Take 1 tablet (10 mg total) by mouth daily.  30 tablet  1  . rosuvastatin (CRESTOR) 5 MG tablet Take 5 mg by mouth 3 (three) times a week.      . triamterene-hydrochlorothiazide (MAXZIDE-25) 37.5-25 MG per tablet Take 1 each (1 tablet total) by mouth daily.  90 tablet  3   No current facility-administered medications on file prior to visit.    Review of Systems Patient denies any headache, lightheadedness or dizziness.  No significant sinus or allergy symptoms.   No chest pain, tightness or palpitations.  No increased shortness of breath, cough or congestion.  Breathing stable.  No nausea or vomiting.  Acid reflux controlled on Nexium.   Some lower abdominal discomfort as outlined.  No bowel change, such as diarrhea, constipation, BRBPR or melana.  No urine change.  Back and leg pain as outlined.  Better.  Seeing Dr Pernell Dupre now.       Objective:   Physical Exam  Filed Vitals:   07/03/13 0953  BP: 120/70  Pulse: 66  Temp: 98.7 F (37.1 C)  Blood pressure recheck: 29/10  79 year old female in no acute distress.   HEENT:  Nares- clear.  Oropharynx - without lesions. NECK:  Supple.  Nontender.  No audible bruit.  HEART:  Appears to be regular. LUNGS:  No crackles or wheezing audible.  Respirations even and unlabored.  RADIAL PULSE:  Equal bilaterally.    ABDOMEN:  Soft.  No significant tenderness to palpation.  No rebound or guarding.   Bowel sounds present and normal.  No audible abdominal bruit.  EXTREMITIES:  No increased edema present.  DP pulses palpable and equal bilaterally.  FEET:  Without lesions.            Assessment & Plan:  MSK.  Back and leg pain.  Persistent.  Has seen neurosurgery.  Desires not to pursue surgery.   MRI reveals severe lumbar spine disc degeneration with osteophyte formation, annular bulge and  disc protrusion.  Diffuse facet hypertrophy.  Mild anterolisthesis of L4 on L5 that is most likely degenerative. No high grade spinal stenosis.  Intolerant to the medication.  Saw Dr Gavin Potters.  Hip injection helped some.  Now seeing Dr Pernell Dupre.  Epidural helped.    GI.  Colonoscopy 09/06/07 revealed diverticulosis and non bleeding hemorrhoids.  Recommended follow up in five years.  States received notice from GI.  No repeat colonoscopy warranted.  She desires not to pursue.  Does have the minimal lower abdominal discomfort as outlined.  Is better.  No significant pain to palpation.  She is eating and drinking.  Bowels ok.  Given persistent discomfort, discussed with her regarding further evaluation (including CT scan).  She declines.  Will notify me if she changes her mind or if symptoms change or do not resolve.      HEALTH MAINTENANCE.  Physical 11/23/12.  She is s/p hysterectomy.  Had pap - 09/22/11 - negative. Colonoscopy as outlined.  Desires no further medication for her bones.  Continue calcium and vitamin D.  Last vitamin D level checked wnl.  Mammogram 02/16/13 - Birads II.

## 2013-07-05 NOTE — Assessment & Plan Note (Signed)
Blood pressure better.  On lisinopril 10mg  q day now.  Cr stable 1.3.  Slightly decreased sodium.  Recheck today.

## 2013-07-05 NOTE — Assessment & Plan Note (Signed)
Controlled.  Follow.   

## 2013-07-09 ENCOUNTER — Encounter: Payer: Self-pay | Admitting: *Deleted

## 2013-07-16 ENCOUNTER — Ambulatory Visit: Payer: Self-pay | Admitting: Ophthalmology

## 2013-07-24 ENCOUNTER — Other Ambulatory Visit (INDEPENDENT_AMBULATORY_CARE_PROVIDER_SITE_OTHER): Payer: Medicare Other

## 2013-07-24 DIAGNOSIS — E871 Hypo-osmolality and hyponatremia: Secondary | ICD-10-CM

## 2013-07-25 ENCOUNTER — Encounter: Payer: Self-pay | Admitting: *Deleted

## 2013-07-25 ENCOUNTER — Other Ambulatory Visit: Payer: Medicare Other

## 2013-07-31 ENCOUNTER — Ambulatory Visit: Payer: Self-pay | Admitting: Anesthesiology

## 2013-08-15 ENCOUNTER — Ambulatory Visit (INDEPENDENT_AMBULATORY_CARE_PROVIDER_SITE_OTHER): Payer: Medicare Other | Admitting: Internal Medicine

## 2013-08-15 ENCOUNTER — Encounter: Payer: Self-pay | Admitting: Internal Medicine

## 2013-08-15 VITALS — BP 130/70 | HR 85 | Temp 98.3°F | Ht 61.0 in | Wt 147.5 lb

## 2013-08-15 DIAGNOSIS — K219 Gastro-esophageal reflux disease without esophagitis: Secondary | ICD-10-CM

## 2013-08-15 DIAGNOSIS — I10 Essential (primary) hypertension: Secondary | ICD-10-CM

## 2013-08-15 DIAGNOSIS — E7800 Pure hypercholesterolemia, unspecified: Secondary | ICD-10-CM

## 2013-08-15 DIAGNOSIS — E119 Type 2 diabetes mellitus without complications: Secondary | ICD-10-CM

## 2013-08-15 DIAGNOSIS — J329 Chronic sinusitis, unspecified: Secondary | ICD-10-CM

## 2013-08-15 DIAGNOSIS — E039 Hypothyroidism, unspecified: Secondary | ICD-10-CM

## 2013-08-15 MED ORDER — AMOXICILLIN 875 MG PO TABS: 875.0000 mg | ORAL_TABLET | Freq: Two times a day (BID) | ORAL | Status: DC

## 2013-08-15 MED ORDER — BENZONATATE 100 MG PO CAPS: 100.0000 mg | ORAL_CAPSULE | Freq: Three times a day (TID) | ORAL | Status: DC | PRN

## 2013-08-15 MED ORDER — LISINOPRIL 10 MG PO TABS: 10.0000 mg | ORAL_TABLET | Freq: Every day | ORAL | Status: DC

## 2013-08-15 MED ORDER — FLUTICASONE PROPIONATE 50 MCG/ACT NA SUSP: 2.0000 | Freq: Every day | NASAL | Status: DC

## 2013-08-15 MED ORDER — HYDROCODONE-ACETAMINOPHEN 5-500 MG PO TABS: 1.0000 | ORAL_TABLET | Freq: Three times a day (TID) | ORAL | Status: DC | PRN

## 2013-08-15 NOTE — Patient Instructions (Signed)
Take amoxicillin (antibiotic) twice a day.  Saline nasal spray - flush nose at least 2-3x/day.  flonase nasal spry - two sprays each nostril one time per day.  Use in the evening.  mucinex DM in the am and Robitussin DM in the evening.  Tessalon Perles as needed.

## 2013-08-16 ENCOUNTER — Encounter: Payer: Self-pay | Admitting: Internal Medicine

## 2013-08-16 DIAGNOSIS — J329 Chronic sinusitis, unspecified: Secondary | ICD-10-CM | POA: Insufficient documentation

## 2013-08-16 NOTE — Assessment & Plan Note (Signed)
Controlled.  Follow.   

## 2013-08-16 NOTE — Assessment & Plan Note (Signed)
A1c just checked 6.5.  Sees Dr Dingledein for her eye exams.  Low carb diet.  Follow.

## 2013-08-16 NOTE — Progress Notes (Signed)
Subjective:    Patient ID: Alice Delgado, female    DOB: [birth year], 79 y.o.   MRN: [id number]  HPI 79 year old female with past history of hypertension, hypercholesterolemia and hypothyroidism who comes in today for a scheduled follow up.  She had been having increased back, left hip and left upper leg pain.  Saw Dr Yves Dill.  Has seen surgery.  Declines surgery.  Had been using hydrocodone prn.  She saw Dr Gavin Potters.  Had a hip injection.  Helped for a while.  He referred her to Dr Pernell Dupre at the pain clinic.  She has had two epidural injections since her last visit.  Also has had cataract surgery.  Doing well with her surgery.  The abdominal pain has resolved.  No bowel change.  She does report that starting two weeks ago, she developed increased sinus pressure and congestion.  Symptoms persist.  Increased pressure and nasal congestion.  Increased drainage.  Increased cough - productive.  No sob.  Some decreased appetite.  Not sleeping as well secondary to increased cough.  No vomiting.     Past Medical History  Diagnosis Date  . Hypertension   . GERD (gastroesophageal reflux disease)   . Hypercholesterolemia   . Hyperglycemia   . Vitamin D deficiency   . Hypothyroidism   . Peptic ulcer disease   . Diverticulosis   . Pernicious anemia   . Osteoarthritis     lumbar disc dz, cervical disc dz, hands  . Osteoporosis     intolerance to fosamax, evista and miacalcin    Current Outpatient Prescriptions on File Prior to Visit  Medication Sig Dispense Refill  . acetaminophen (TYLENOL) 325 MG tablet Take 650 mg by mouth every 6 (six) hours as needed.      Marland Kitchen amLODipine (NORVASC) 5 MG tablet Take 1 tablet (5 mg total) by mouth daily.  90 tablet  3  . cetirizine (ZYRTEC) 10 MG tablet Take 10 mg by mouth daily.      . cyanocobalamin (,VITAMIN B-12,) 1000 MCG/ML injection Inject 1,000 mcg into the muscle every 30 (thirty) days.      Marland Kitchen esomeprazole (NEXIUM) 40 MG capsule Take 1 capsule (40 mg  total) by mouth daily.  30 capsule  5  . levothyroxine (SYNTHROID, LEVOTHROID) 25 MCG tablet Take 1 tablet (25 mcg total) by mouth daily.  90 tablet  3  . triamterene-hydrochlorothiazide (MAXZIDE-25) 37.5-25 MG per tablet Take 1 each (1 tablet total) by mouth daily.  90 tablet  3   No current facility-administered medications on file prior to visit.    Review of Systems Patient denies any headache, lightheadedness or dizziness.  Does report sinus congestion and pressure as outlined.  No chest pain, tightness or palpitations.  No increased shortness of breath.  Does report increased productive cough.   Breathing stable.  No nausea or vomiting.  Acid reflux controlled on Nexium.   Lower abdominal pressure/pain has resolved.    No bowel change, such as diarrhea, constipation, BRBPR or melana.  No urine change.   Seeing Dr Pernell Dupre now.       Objective:   Physical Exam  Filed Vitals:   08/15/13 1015  BP: 130/70  Pulse: 85  Temp: 98.3 F (36.8 C)   Blood pressure recheck: 87/64  79 year old female in no acute distress.   HEENT:  Nares- erythematous turbinates.  Oropharynx - without lesions.  TMs without erythema.  Increased tenderness to palpation over the maxillary sinus.  NECK:  Supple.  Nontender.  No audible bruit.  HEART:  Appears to be regular. LUNGS:  No crackles or wheezing audible.  Respirations even and unlabored.  RADIAL PULSE:  Equal bilaterally.    ABDOMEN:  Soft.  Non tender.  No rebound or guarding.   Bowel sounds present and normal.  No audible abdominal bruit.  EXTREMITIES:  No increased edema present.  DP pulses palpable and equal bilaterally.           Assessment & Plan:  MSK.  Back and leg pain.  Persistent.  Has seen neurosurgery.  Desires not to pursue surgery.   MRI reveals severe lumbar spine disc degeneration with osteophyte formation, annular bulge and disc protrusion.  Diffuse facet hypertrophy.  Mild anterolisthesis of L4 on L5 that is most likely degenerative.  No high grade spinal stenosis.   Saw Dr Gavin Potters.  Hip injection helped some.  Now seeing Dr Pernell Dupre.  Epidural helped.  Takes hydrocodone prn.  Follow.   GI.  Colonoscopy 09/06/07 revealed diverticulosis and non bleeding hemorrhoids.  Recommended follow up in five years.  States received notice from GI.  No repeat colonoscopy warranted.  Abdominal pain resolved.       HEALTH MAINTENANCE.  Physical 11/23/12.  She is s/p hysterectomy.  Had pap - 09/22/11 - negative. Colonoscopy as outlined.  Desires no further medication for her bones.  Continue calcium and vitamin D.  Last vitamin D level checked wnl.  Mammogram 02/16/13 - Birads II.

## 2013-08-16 NOTE — Assessment & Plan Note (Signed)
On synthroid.  Follow tsh.   

## 2013-08-16 NOTE — Assessment & Plan Note (Signed)
Symptoms persistent.  Treat with amoxicillin 875mg  bid x 10 days.  Saline nasal spray and flonase as outlined.  mucinex in the am and robitussin in the pm.  Tessalon perles as directed.  Follow.

## 2013-08-16 NOTE — Assessment & Plan Note (Signed)
Blood pressure as outlined.  On lisinopril 10mg  q day now.  Cr stable 1.3.  Treat infection.  Follow.  Hold on making any changes in her medication at this time.  Follow.

## 2013-08-16 NOTE — Assessment & Plan Note (Signed)
Low cholesterol diet and exercise.  Follow lipid panel.   

## 2013-09-18 ENCOUNTER — Encounter: Payer: Self-pay | Admitting: Internal Medicine

## 2013-09-18 ENCOUNTER — Ambulatory Visit (INDEPENDENT_AMBULATORY_CARE_PROVIDER_SITE_OTHER): Payer: Medicare Other | Admitting: Internal Medicine

## 2013-09-18 VITALS — BP 120/70 | HR 74 | Temp 98.2°F | Ht 61.0 in | Wt 145.5 lb

## 2013-09-18 DIAGNOSIS — E039 Hypothyroidism, unspecified: Secondary | ICD-10-CM

## 2013-09-18 DIAGNOSIS — K219 Gastro-esophageal reflux disease without esophagitis: Secondary | ICD-10-CM

## 2013-09-18 DIAGNOSIS — E119 Type 2 diabetes mellitus without complications: Secondary | ICD-10-CM

## 2013-09-18 DIAGNOSIS — J329 Chronic sinusitis, unspecified: Secondary | ICD-10-CM

## 2013-09-18 DIAGNOSIS — I10 Essential (primary) hypertension: Secondary | ICD-10-CM

## 2013-09-18 DIAGNOSIS — E559 Vitamin D deficiency, unspecified: Secondary | ICD-10-CM

## 2013-09-18 DIAGNOSIS — E7800 Pure hypercholesterolemia, unspecified: Secondary | ICD-10-CM

## 2013-09-18 MED ORDER — HYDROCODONE-ACETAMINOPHEN 5-325MG PREPACK (~~LOC~~: ORAL_TABLET | ORAL | Status: DC

## 2013-09-18 NOTE — Progress Notes (Signed)
Subjective:    Patient ID: Alice Delgado, female    DOB: [birth year], 79 y.o.   MRN: [id number]  HPI 79 year old female with past history of hypertension, hypercholesterolemia and hypothyroidism who comes in today for a scheduled follow up.  She is accompanied by her husband.  History obtained from both of them.  She had been having increased back, left hip and left upper leg pain.  Saw Dr Yves Dill.  Has seen surgery.  Declines surgery.  Had been using hydrocodone prn.  She saw Dr Gavin Potters.  Had a hip injection.  Helped for a while.  He referred her to Dr Pernell Dupre at the pain clinic.  Had two epidural injections.   Also has had cataract surgery.  Doing well with her surgery.  The abdominal pain has resolved.  No bowel change.  Sinus symptoms have improved.  Breathing better.  Overall doing much better.  Appetite better.  Overall she feels she is basically back to baseline.      Past Medical History  Diagnosis Date  . Hypertension   . GERD (gastroesophageal reflux disease)   . Hypercholesterolemia   . Hyperglycemia   . Vitamin D deficiency   . Hypothyroidism   . Peptic ulcer disease   . Diverticulosis   . Pernicious anemia   . Osteoarthritis     lumbar disc dz, cervical disc dz, hands  . Osteoporosis     intolerance to fosamax, evista and miacalcin    Current Outpatient Prescriptions on File Prior to Visit  Medication Sig Dispense Refill  . acetaminophen (TYLENOL) 325 MG tablet Take 650 mg by mouth every 6 (six) hours as needed.      Marland Kitchen amLODipine (NORVASC) 5 MG tablet Take 1 tablet (5 mg total) by mouth daily.  90 tablet  3  . cetirizine (ZYRTEC) 10 MG tablet Take 10 mg by mouth daily.      . cyanocobalamin (,VITAMIN B-12,) 1000 MCG/ML injection Inject 1,000 mcg into the muscle every 30 (thirty) days.      Marland Kitchen esomeprazole (NEXIUM) 40 MG capsule Take 1 capsule (40 mg total) by mouth daily.  30 capsule  5  . fluticasone (FLONASE) 50 MCG/ACT nasal spray Place 2 sprays into the nose daily.   16 g  2  . HYDROcodone-acetaminophen (VICODIN) 5-500 MG per tablet Take 1 tablet by mouth every 8 (eight) hours as needed.  30 tablet  0  . levothyroxine (SYNTHROID, LEVOTHROID) 25 MCG tablet Take 1 tablet (25 mcg total) by mouth daily.  90 tablet  3  . lisinopril (PRINIVIL,ZESTRIL) 10 MG tablet Take 1 tablet (10 mg total) by mouth daily.  30 tablet  3  . triamterene-hydrochlorothiazide (MAXZIDE-25) 37.5-25 MG per tablet Take 1 each (1 tablet total) by mouth daily.  90 tablet  3   No current facility-administered medications on file prior to visit.    Review of Systems Patient denies any headache, lightheadedness or dizziness.  Sinus symptoms have basically resolved.   No chest pain, tightness or palpitations.  No increased shortness of breath.  No chest congestion.  No significant cough.  Breathing stable.  No nausea or vomiting.  Acid reflux controlled on Nexium.   Lower abdominal pressure/pain has resolved.    No bowel change, such as diarrhea, constipation, BRBPR or melana.  No urine change.   Was seeing Dr Pernell Dupre.  Now taking hydrocodone prn.  Pain controlled as above.        Objective:   Physical Exam  Filed  Vitals:   09/18/13 1414  BP: 120/70  Pulse: 74  Temp: 98.2 F (36.8 C)   Blood pressure recheck: 90-59/74  79 year old female in no acute distress.   HEENT:  Nares- clear.   Oropharynx - without lesions.    NECK:  Supple.  Nontender.  No audible bruit.  HEART:  Appears to be regular. LUNGS:  No crackles or wheezing audible.  Respirations even and unlabored.  RADIAL PULSE:  Equal bilaterally.    ABDOMEN:  Soft.  Non tender.  No rebound or guarding.   Bowel sounds present and normal.  No audible abdominal bruit.  EXTREMITIES:  No increased edema present.  DP pulses palpable and equal bilaterally.  FEET:  Without lesions.            Assessment & Plan:  MSK.  Back and leg pain.  Persistent.  Has seen neurosurgery.  Desires not to pursue surgery.   MRI reveals severe lumbar  spine disc degeneration with osteophyte formation, annular bulge and disc protrusion.  Diffuse facet hypertrophy.  Mild anterolisthesis of L4 on L5 that is most likely degenerative. No high grade spinal stenosis.   Saw Dr Gavin Potters.  Hip injection helped some.  Saw Dr Pernell Dupre.  Epidural helped.  Takes hydrocodone prn.  Follow.  Refilled today.   GI.  Colonoscopy 09/06/07 revealed diverticulosis and non bleeding hemorrhoids.  Recommended follow up in five years.  States received notice from GI.  No repeat colonoscopy warranted.  Abdominal pain resolved.       HEALTH MAINTENANCE.  Physical 11/23/12.  She is s/p hysterectomy.  Had pap - 09/22/11 - negative. Colonoscopy as outlined.  Desires no further medication for her bones.  Continue calcium and vitamin D.  Last vitamin D level checked wnl.  Mammogram 02/16/13 - Birads II.

## 2013-09-18 NOTE — Progress Notes (Signed)
Pre-visit discussion using our clinic review tool. No additional management support is needed unless otherwise documented below in the visit note.  

## 2013-09-22 ENCOUNTER — Encounter: Payer: Self-pay | Admitting: Internal Medicine

## 2013-09-22 NOTE — Assessment & Plan Note (Signed)
On synthroid.  Follow tsh.   

## 2013-09-22 NOTE — Assessment & Plan Note (Signed)
Blood pressure as outlined.  On lisinopril 10mg  q day now.  Cr stable 1.3.  Blood pressure stable.  Same medication regimen.  Follow.

## 2013-09-22 NOTE — Assessment & Plan Note (Signed)
Controlled.  Follow.   

## 2013-09-22 NOTE — Assessment & Plan Note (Signed)
Continue supplements.  Follow.   

## 2013-09-22 NOTE — Assessment & Plan Note (Signed)
Low cholesterol diet and exercise.  Follow lipid panel.   

## 2013-09-22 NOTE — Assessment & Plan Note (Signed)
A1c last checked 6.5.  Sees Dr Dingledein for her eye exams.  Low carb diet.  Follow.   

## 2013-09-22 NOTE — Assessment & Plan Note (Signed)
Resolved

## 2013-10-05 ENCOUNTER — Telehealth: Payer: Self-pay | Admitting: Internal Medicine

## 2013-10-05 NOTE — Telephone Encounter (Signed)
Left message for pt to return my call, advised of appt at 1:15 but to call back to confirm she could come then.

## 2013-10-05 NOTE — Telephone Encounter (Signed)
Patient Information:  Caller Name: Yanel  Phone: [phone]  Patient: Alice Delgado, Alice Delgado  Gender: Female  DOB: [birth year]  Age: 79 Years  PCP: Dale Rebecca  Office Follow Up:  Does the office need to follow up with this patient?: Yes  Instructions For The Office: Home care advice given . No appt available today. Patient home care treatment not working.  Please contact for Appt.  RN Note:  Home care advice given . No appt available today. Patient home care treatment not working.  Please contact for Appt.  Symptoms  Reason For Call & Symptoms: Patient states onset of illness 09/18/13 she was placed on Amoxicillen. She states she did get some better but became sick again on Monday 10/01/13. Onset of cough occasional productive and clear, unable to sleep due to coughing. +runny nose clear, near vomiting. . Decreased appetitie , +drinking.. Occasional wheeze noted , none now  Afebrile  Reviewed Health History In EMR: Yes  Reviewed Medications In EMR: Yes  Reviewed Allergies In EMR: Yes  Reviewed Surgeries / Procedures: Yes  Date of Onset of Symptoms: 10/01/2013  Treatments Tried: Tylenol cold/ Robitussin  Treatments Tried Worked: No  Guideline(s) Used:  Cough  Disposition Per Guideline:   See Today or Tomorrow in Office  Reason For Disposition Reached:   Continuous (nonstop) coughing interferes with work or school and no improvement using cough treatment per Care Advice  Advice Given:  Cough Medicines:  OTC Cough Drops: Cough drops can help a lot, especially for mild coughs. They reduce coughing by soothing your irritated throat and removing that tickle sensation in the back of the throat. Cough drops also have the advantage of portability - you can carry them with you.  Home Remedy - Hard Candy: Hard candy works just as well as medicine-flavored OTC cough drops. Diabetics should use sugar-free candy.  Home Remedy - Honey: This old home remedy has been shown to help decrease  coughing at night. The adult dosage is 2 teaspoons (10 ml) at bedtime. Honey should not be given to infants under one year of age.  Coughing Spasms:  Drink warm fluids. Inhale warm mist (Reason: both relax the airway and loosen up the phlegm).  Suck on cough drops or hard candy to coat the irritated throat.  Call Back If:  Difficulty breathing  Cough lasts more than 3 weeks  You become worse.  RN Overrode Recommendation:  Patient Requests Prescription  Home care advice given . No appt available today. Patient home care treatment not working.  Please contact for Appt.

## 2013-10-05 NOTE — Telephone Encounter (Signed)
See if can come at 1:15.  Work in for that.

## 2013-10-05 NOTE — Telephone Encounter (Signed)
FYI

## 2013-10-05 NOTE — Telephone Encounter (Signed)
Noted  

## 2013-10-05 NOTE — Telephone Encounter (Signed)
Went to [place] walk in clinic.

## 2013-10-15 ENCOUNTER — Emergency Department: Payer: Self-pay | Admitting: Emergency Medicine

## 2013-10-15 LAB — COMPREHENSIVE METABOLIC PANEL
Albumin: 3.8 g/dL (ref 3.4–5.0)
Alkaline Phosphatase: 91 U/L
Anion Gap: 13 (ref 7–16)
BUN: 12 mg/dL (ref 7–18)
Bilirubin,Total: 0.5 mg/dL (ref 0.2–1.0)
Calcium, Total: 9.5 mg/dL (ref 8.5–10.1)
Chloride: 95 mmol/L — ABNORMAL LOW (ref 98–107)
Co2: 22 mmol/L (ref 21–32)
EGFR (African American): 37 — ABNORMAL LOW
EGFR (Non-African Amer.): 32 — ABNORMAL LOW
Glucose: 206 mg/dL — ABNORMAL HIGH (ref 65–99)
Osmolality: 267 (ref 275–301)
Potassium: 3.7 mmol/L (ref 3.5–5.1)
SGOT(AST): 28 U/L (ref 15–37)
SGPT (ALT): 18 U/L (ref 12–78)
Sodium: 130 mmol/L — ABNORMAL LOW (ref 136–145)

## 2013-10-15 LAB — CBC
HCT: 39.5 % (ref 35.0–47.0)
HGB: 13 g/dL (ref 12.0–16.0)
MCH: 26.6 pg (ref 26.0–34.0)
RBC: 4.91 10*6/uL (ref 3.80–5.20)
RDW: 14.7 % — ABNORMAL HIGH (ref 11.5–14.5)

## 2013-10-17 ENCOUNTER — Ambulatory Visit (INDEPENDENT_AMBULATORY_CARE_PROVIDER_SITE_OTHER): Payer: Medicare Other | Admitting: Internal Medicine

## 2013-10-17 ENCOUNTER — Encounter: Payer: Self-pay | Admitting: Internal Medicine

## 2013-10-17 VITALS — BP 120/62 | HR 68 | Temp 98.0°F | Ht 61.0 in | Wt 144.5 lb

## 2013-10-17 DIAGNOSIS — E871 Hypo-osmolality and hyponatremia: Secondary | ICD-10-CM

## 2013-10-17 DIAGNOSIS — R0602 Shortness of breath: Secondary | ICD-10-CM

## 2013-10-17 DIAGNOSIS — T7840XA Allergy, unspecified, initial encounter: Secondary | ICD-10-CM

## 2013-10-17 DIAGNOSIS — D72829 Elevated white blood cell count, unspecified: Secondary | ICD-10-CM

## 2013-10-17 DIAGNOSIS — I10 Essential (primary) hypertension: Secondary | ICD-10-CM

## 2013-10-17 DIAGNOSIS — N289 Disorder of kidney and ureter, unspecified: Secondary | ICD-10-CM

## 2013-10-17 LAB — BASIC METABOLIC PANEL
BUN: 15 mg/dL (ref 6–23)
CO2: 25 mEq/L (ref 19–32)
Calcium: 9.9 mg/dL (ref 8.4–10.5)
Glucose, Bld: 116 mg/dL — ABNORMAL HIGH (ref 70–99)
Potassium: 3.5 mEq/L (ref 3.5–5.3)

## 2013-10-17 LAB — CBC WITH DIFFERENTIAL/PLATELET
Basophils Absolute: 0 10*3/uL (ref 0.0–0.1)
Basophils Relative: 0 % (ref 0–1)
Eosinophils Relative: 0 % (ref 0–5)
HCT: 36.5 % (ref 36.0–46.0)
Hemoglobin: 12.4 g/dL (ref 12.0–15.0)
Lymphocytes Relative: 8 % — ABNORMAL LOW (ref 12–46)
MCH: 27 pg (ref 26.0–34.0)
MCHC: 34 g/dL (ref 30.0–36.0)
Monocytes Absolute: 0.7 10*3/uL (ref 0.1–1.0)
Neutro Abs: 12.5 10*3/uL — ABNORMAL HIGH (ref 1.7–7.7)
Platelets: 234 10*3/uL (ref 150–400)
RDW: 14.7 % (ref 11.5–15.5)
WBC: 14.3 10*3/uL — ABNORMAL HIGH (ref 4.0–10.5)

## 2013-10-17 MED ORDER — LOSARTAN POTASSIUM 25 MG PO TABS: 25.0000 mg | ORAL_TABLET | Freq: Every day | ORAL | Status: DC

## 2013-10-17 NOTE — Progress Notes (Signed)
Pre-visit discussion using our clinic review tool. No additional management support is needed unless otherwise documented below in the visit note.  

## 2013-10-19 ENCOUNTER — Encounter: Payer: Self-pay | Admitting: *Deleted

## 2013-10-19 ENCOUNTER — Encounter: Payer: Self-pay | Admitting: Internal Medicine

## 2013-10-19 DIAGNOSIS — D72829 Elevated white blood cell count, unspecified: Secondary | ICD-10-CM | POA: Insufficient documentation

## 2013-10-19 DIAGNOSIS — E871 Hypo-osmolality and hyponatremia: Secondary | ICD-10-CM | POA: Insufficient documentation

## 2013-10-19 DIAGNOSIS — R0602 Shortness of breath: Secondary | ICD-10-CM | POA: Insufficient documentation

## 2013-10-19 DIAGNOSIS — T7840XA Allergy, unspecified, initial encounter: Secondary | ICD-10-CM | POA: Insufficient documentation

## 2013-10-19 NOTE — Progress Notes (Signed)
Subjective:    Patient ID: Alice Delgado, female    DOB: 12-19-1928, 80 y.o.   MRN: [id number]  HPI 80 year old female with past history of hypertension, hypercholesterolemia and hypothyroidism who comes in today as a work in for an ER follow up.   She is accompanied by her daughter.   History obtained from both of them.  She reports that she was having some increased cough and was evaluated at acute care on 10/05/13.  Was given a zpak.  Symptoms persisted and she was seen at [facility].  Was placed on Augmentin.  After she took a dose, she developed some itching and her hands turned red.  Began feeling sick.  Positive emesis and diarrhea.  Later became red all over.  Had face and lip swelling.  Throat felt like it was closing.  911 called.  Pt taken to ER.  Had cxr, EKG and labs in ER.  Placed on steroids.  She feels better.  No further emesis.  She is eating.  No diarrhea.  Feels washed out.  Still with persistent cough.    Past Medical History  Diagnosis Date  . Hypertension   . GERD (gastroesophageal reflux disease)   . Hypercholesterolemia   . Hyperglycemia   . Vitamin D deficiency   . Hypothyroidism   . Peptic ulcer disease   . Diverticulosis   . Pernicious anemia   . Osteoarthritis     lumbar disc dz, cervical disc dz, hands  . Osteoporosis     intolerance to fosamax, evista and miacalcin    Current Outpatient Prescriptions on File Prior to Visit  Medication Sig Dispense Refill  . acetaminophen (TYLENOL) 325 MG tablet Take 650 mg by mouth every 6 (six) hours as needed.      Marland Kitchen amLODipine (NORVASC) 5 MG tablet Take 1 tablet (5 mg total) by mouth daily.  90 tablet  3  . cetirizine (ZYRTEC) 10 MG tablet Take 10 mg by mouth daily.      . cyanocobalamin (,VITAMIN B-12,) 1000 MCG/ML injection Inject 1,000 mcg into the muscle every 30 (thirty) days.      Marland Kitchen esomeprazole (NEXIUM) 40 MG capsule Take 1 capsule (40 mg total) by mouth daily.  30 capsule  5  . fluticasone (FLONASE)  50 MCG/ACT nasal spray Place 2 sprays into the nose daily.  16 g  2  . HYDROcodone-acetaminophen (VICODIN) 5-325 mg TABS tablet One tablet q day to bid prn  40 tablet  0  . levothyroxine (SYNTHROID, LEVOTHROID) 25 MCG tablet Take 1 tablet (25 mcg total) by mouth daily.  90 tablet  3  . triamterene-hydrochlorothiazide (MAXZIDE-25) 37.5-25 MG per tablet Take 1 each (1 tablet total) by mouth daily.  90 tablet  3   No current facility-administered medications on file prior to visit.    Review of Systems Patient denies any headache, lightheadedness or dizziness.  No sinus symptoms.  No chest pain, tightness or palpitations.  No increased shortness of breath.  No chest congestion.  Persistent cough.  No nausea or vomiting now.  Acid reflux controlled on Nexium.   Lower abdominal pressure/pain has resolved.    No bowel change, such as diarrhea.  Eating.  On Lisinopril.  This was started in 8/14.  Cough started around this time as well.   Did report sob with exertion.  Had noticed this prior to her allergic reaction.      Objective:   Physical Exam  Filed Vitals:   10/17/13  1303  BP: 120/62  Pulse: 68  Temp: 98 F (36.7 C)   Blood pressure recheck: 34/108  80 year old female in no acute distress.   HEENT:  Nares- clear.   Oropharynx - without lesions.    NECK:  Supple.  Nontender.  No audible bruit.  HEART:  Appears to be regular. LUNGS:  No crackles or wheezing audible.  Respirations even and unlabored.  RADIAL PULSE:  Equal bilaterally.    ABDOMEN:  Soft.  Non tender.  No rebound or guarding.   Bowel sounds present and normal.  No audible abdominal bruit.  EXTREMITIES:  No increased edema present.  DP pulses palpable and equal bilaterally.            Assessment & Plan:  HEALTH MAINTENANCE.  Physical 11/23/12.  She is s/p hysterectomy.  Had pap - 09/22/11 - negative. Colonoscopy as outlined.  Desires no further medication for her bones.  Continue calcium and vitamin D.  Last vitamin D level  checked wnl.  Mammogram 02/16/13 - Birads II.

## 2013-10-19 NOTE — Assessment & Plan Note (Addendum)
Will stop the lisinopril.  Start losartan 25mg  q day.  Follow pressures.  Get her back in soon to reassess.  Cr was found to be elevated in the ER.  Recheck today.  Stay hydrated.

## 2013-10-19 NOTE — Assessment & Plan Note (Signed)
Had a significant allergic reaction.  Went to ER.  W/up as outlined.  On steroid taper.  Feeling better.  Eating.  Will stop lisinopril.  Start cozaar.  Need to make sure the ACE inhibitor not contributing to her cough.  Get her back in soon to reassess.

## 2013-10-19 NOTE — Assessment & Plan Note (Signed)
Reported sob with exertion.  This occurred prior to her allergic reaction.  EKG obtained and revealed SR with no acute ischemic changes.  Need to confirm no cardiac source.  Check stress echo with doppler.  Further w/up pending results.

## 2013-10-19 NOTE — Assessment & Plan Note (Signed)
Found to have a low sodium in the ER.  Recheck today.

## 2013-10-19 NOTE — Assessment & Plan Note (Signed)
Found to have an elevated white blood cell count in the ER.  Probably related to the stress reaction and steroids.  Recheck today.

## 2013-10-23 ENCOUNTER — Ambulatory Visit (INDEPENDENT_AMBULATORY_CARE_PROVIDER_SITE_OTHER): Payer: Medicare Other | Admitting: Internal Medicine

## 2013-10-23 ENCOUNTER — Encounter (INDEPENDENT_AMBULATORY_CARE_PROVIDER_SITE_OTHER): Payer: Self-pay

## 2013-10-23 ENCOUNTER — Encounter: Payer: Self-pay | Admitting: Internal Medicine

## 2013-10-23 VITALS — BP 130/60 | HR 98 | Temp 98.4°F | Ht 61.0 in | Wt 141.8 lb

## 2013-10-23 DIAGNOSIS — R0602 Shortness of breath: Secondary | ICD-10-CM

## 2013-10-23 DIAGNOSIS — R05 Cough: Secondary | ICD-10-CM

## 2013-10-23 DIAGNOSIS — T7840XD Allergy, unspecified, subsequent encounter: Secondary | ICD-10-CM

## 2013-10-23 DIAGNOSIS — E871 Hypo-osmolality and hyponatremia: Secondary | ICD-10-CM

## 2013-10-23 DIAGNOSIS — D72829 Elevated white blood cell count, unspecified: Secondary | ICD-10-CM

## 2013-10-23 DIAGNOSIS — E119 Type 2 diabetes mellitus without complications: Secondary | ICD-10-CM

## 2013-10-23 DIAGNOSIS — Z5189 Encounter for other specified aftercare: Secondary | ICD-10-CM

## 2013-10-23 DIAGNOSIS — R059 Cough, unspecified: Secondary | ICD-10-CM

## 2013-10-23 DIAGNOSIS — K219 Gastro-esophageal reflux disease without esophagitis: Secondary | ICD-10-CM

## 2013-10-23 DIAGNOSIS — I10 Essential (primary) hypertension: Secondary | ICD-10-CM

## 2013-10-23 MED ORDER — FLUTICASONE PROPIONATE HFA 110 MCG/ACT IN AERO: 2.0000 | INHALATION_SPRAY | Freq: Two times a day (BID) | RESPIRATORY_TRACT | Status: DC

## 2013-10-23 NOTE — Progress Notes (Signed)
Pre-visit discussion using our clinic review tool. No additional management support is needed unless otherwise documented below in the visit note.  

## 2013-10-23 NOTE — Patient Instructions (Signed)
Add zantac - one tablet 69minutes before your evening meal.

## 2013-10-28 ENCOUNTER — Encounter: Payer: Self-pay | Admitting: Internal Medicine

## 2013-10-28 DIAGNOSIS — R05 Cough: Secondary | ICD-10-CM | POA: Insufficient documentation

## 2013-10-28 DIAGNOSIS — R059 Cough, unspecified: Secondary | ICD-10-CM | POA: Insufficient documentation

## 2013-10-28 NOTE — Assessment & Plan Note (Signed)
Found to have an elevated white blood cell count in the ER.  Probably related to the stress reaction and steroids.  Recheck improved.

## 2013-10-28 NOTE — Assessment & Plan Note (Signed)
Persistent.  See last note.  Off lisinopril now.  Possible some allergic component.  Restart zyrtec.  Continue inhaler use.  Will refer to pulmonary for further evaluation and w/up.

## 2013-10-28 NOTE — Assessment & Plan Note (Signed)
Reported sob with exertion.  This occurred prior to her allergic reaction.  EKG obtained and revealed SR with no acute ischemic changes.  Need to confirm no cardiac source.  Check stress echo with doppler.  Further w/up pending results.

## 2013-10-28 NOTE — Assessment & Plan Note (Signed)
Appears to be under control.  Continue same medication regimen.

## 2013-10-28 NOTE — Assessment & Plan Note (Signed)
Had a significant allergic reaction.  Went to ER.  W/up as outlined.  Was placed on steroid taper.  Feeling better.  Eating.

## 2013-10-28 NOTE — Assessment & Plan Note (Signed)
Most recent check improved.  Follow.

## 2013-10-28 NOTE — Assessment & Plan Note (Signed)
A1c last checked 6.5.  Sees Dr Dingledein for her eye exams.  Low carb diet.  Follow.

## 2013-10-28 NOTE — Progress Notes (Signed)
Subjective:    Patient ID: Alice Delgado, female    DOB: 02-Nov-1928, 80 y.o.   MRN: [id number]  HPI 80 year old female with past history of hypertension, hypercholesterolemia and hypothyroidism who comes in today for a scheduled follow up.   She is accompanied by her daughter.   History obtained from both of them.  She reports that she was having some increased cough and was evaluated at acute care on 10/05/13.  Was given a zpak.  Symptoms persisted and she was seen at [facility].  Was placed on Augmentin.  After she took a dose, she developed some itching and her hands turned red.  Began feeling sick.  Positive emesis and diarrhea.  Later became red all over.  Had face and lip swelling.  Throat felt like it was closing.  911 called.  Pt taken to ER.  Had cxr, EKG and labs in ER.  Placed on steroids.  Was seen here last week.  See that note for details.  Is doing better.  Feels some better.  Still with residual cough.  Still with some sob with exertion.  Some intermittent tightness.  Not on her zyrtec now.  States acid reflux under reasonable control.  Cough with no significant mucus production.      Past Medical History  Diagnosis Date  . Hypertension   . GERD (gastroesophageal reflux disease)   . Hypercholesterolemia   . Hyperglycemia   . Vitamin D deficiency   . Hypothyroidism   . Peptic ulcer disease   . Diverticulosis   . Pernicious anemia   . Osteoarthritis     lumbar disc dz, cervical disc dz, hands  . Osteoporosis     intolerance to fosamax, evista and miacalcin    Current Outpatient Prescriptions on File Prior to Visit  Medication Sig Dispense Refill  . acetaminophen (TYLENOL) 325 MG tablet Take 650 mg by mouth every 6 (six) hours as needed.      . cetirizine (ZYRTEC) 10 MG tablet Take 10 mg by mouth daily as needed for allergies.       . cyanocobalamin (,VITAMIN B-12,) 1000 MCG/ML injection Inject 1,000 mcg into the muscle every 30 (thirty) days.      Marland Kitchen esomeprazole  (NEXIUM) 40 MG capsule Take 1 capsule (40 mg total) by mouth daily.  30 capsule  5  . HYDROcodone-acetaminophen (VICODIN) 5-325 mg TABS tablet One tablet q day to bid prn  40 tablet  0  . levothyroxine (SYNTHROID, LEVOTHROID) 25 MCG tablet Take 1 tablet (25 mcg total) by mouth daily.  90 tablet  3  . losartan (COZAAR) 25 MG tablet Take 1 tablet (25 mg total) by mouth daily.  30 tablet  2  . triamterene-hydrochlorothiazide (MAXZIDE-25) 37.5-25 MG per tablet Take 1 each (1 tablet total) by mouth daily.  90 tablet  3   No current facility-administered medications on file prior to visit.    Review of Systems Patient denies any headache, lightheadedness or dizziness.  No sinus symptoms.  No chest pain or palpitations.  Does report sob with exertion.  No chest congestion.  Persistent cough.  Using inhaler.  No nausea or vomiting now.  Acid reflux under reasonable control on Nexium.   No bowel change, such as diarrhea.  Eating.  Stopped her Lisinopril after her last visit.  I switched her to losartan.  Not taking her zyrtec.  Some fatigue still.       Objective:   Physical Exam  Filed Vitals:  10/23/13 1613  BP: 130/60  Pulse: 98  Temp: 98.4 F (44.82 C)   80 year old female in no acute distress.   HEENT:  Nares- clear.   Oropharynx - without lesions.    NECK:  Supple.  Nontender.  No audible bruit.  HEART:  Appears to be regular. LUNGS:  No crackles or wheezing audible.  Respirations even and unlabored.  RADIAL PULSE:  Equal bilaterally.    ABDOMEN:  Soft.  Non tender.  No rebound or guarding.   Bowel sounds present and normal.  No audible abdominal bruit.  EXTREMITIES:  No increased edema present.  DP pulses palpable and equal bilaterally.            Assessment & Plan:  HEALTH MAINTENANCE.  Physical 11/23/12.  She is s/p hysterectomy.  Had pap - 09/22/11 - negative. Colonoscopy as outlined.  Desires no further medication for her bones.  Continue calcium and vitamin D.  Last vitamin D level  checked wnl.  Mammogram 02/16/13 - Birads II.

## 2013-10-28 NOTE — Assessment & Plan Note (Signed)
Lisinopril was stopped secondary to concern that this was contributing to her cough.  Was started on losartan 25mg  q day.  Blood pressure doing better.  Follow.

## 2013-11-13 ENCOUNTER — Ambulatory Visit (INDEPENDENT_AMBULATORY_CARE_PROVIDER_SITE_OTHER): Payer: Medicare Other | Admitting: Internal Medicine

## 2013-11-13 ENCOUNTER — Encounter: Payer: Self-pay | Admitting: Internal Medicine

## 2013-11-13 VITALS — BP 130/68 | HR 82 | Temp 98.0°F | Ht 61.0 in | Wt 141.5 lb

## 2013-11-13 DIAGNOSIS — R059 Cough, unspecified: Secondary | ICD-10-CM

## 2013-11-13 DIAGNOSIS — E871 Hypo-osmolality and hyponatremia: Secondary | ICD-10-CM

## 2013-11-13 DIAGNOSIS — D72829 Elevated white blood cell count, unspecified: Secondary | ICD-10-CM

## 2013-11-13 DIAGNOSIS — R0602 Shortness of breath: Secondary | ICD-10-CM

## 2013-11-13 DIAGNOSIS — E7800 Pure hypercholesterolemia, unspecified: Secondary | ICD-10-CM

## 2013-11-13 DIAGNOSIS — E559 Vitamin D deficiency, unspecified: Secondary | ICD-10-CM

## 2013-11-13 DIAGNOSIS — E039 Hypothyroidism, unspecified: Secondary | ICD-10-CM

## 2013-11-13 DIAGNOSIS — K219 Gastro-esophageal reflux disease without esophagitis: Secondary | ICD-10-CM

## 2013-11-13 DIAGNOSIS — E119 Type 2 diabetes mellitus without complications: Secondary | ICD-10-CM

## 2013-11-13 DIAGNOSIS — I10 Essential (primary) hypertension: Secondary | ICD-10-CM

## 2013-11-13 DIAGNOSIS — R05 Cough: Secondary | ICD-10-CM

## 2013-11-13 DIAGNOSIS — T7840XA Allergy, unspecified, initial encounter: Secondary | ICD-10-CM

## 2013-11-13 LAB — CBC WITH DIFFERENTIAL/PLATELET
Basophils Absolute: 0.1 10*3/uL (ref 0.0–0.1)
Basophils Relative: 0.8 % (ref 0.0–3.0)
EOS ABS: 0.4 10*3/uL (ref 0.0–0.7)
Eosinophils Relative: 5.3 % — ABNORMAL HIGH (ref 0.0–5.0)
HCT: 39.2 % (ref 36.0–46.0)
Hemoglobin: 12.8 g/dL (ref 12.0–15.0)
Lymphocytes Relative: 18.9 % (ref 12.0–46.0)
Lymphs Abs: 1.4 10*3/uL (ref 0.7–4.0)
MCHC: 32.6 g/dL (ref 30.0–36.0)
MCV: 81.8 fl (ref 78.0–100.0)
MONO ABS: 0.8 10*3/uL (ref 0.1–1.0)
Monocytes Relative: 10.4 % (ref 3.0–12.0)
NEUTROS PCT: 64.6 % (ref 43.0–77.0)
Neutro Abs: 4.7 10*3/uL (ref 1.4–7.7)
Platelets: 274 10*3/uL (ref 150.0–400.0)
RBC: 4.79 Mil/uL (ref 3.87–5.11)
RDW: 14.4 % (ref 11.5–14.6)
WBC: 7.3 10*3/uL (ref 4.5–10.5)

## 2013-11-13 LAB — COMPREHENSIVE METABOLIC PANEL
ALBUMIN: 3.9 g/dL (ref 3.5–5.2)
ALT: 8 U/L (ref 0–35)
AST: 17 U/L (ref 0–37)
Alkaline Phosphatase: 81 U/L (ref 39–117)
BUN: 11 mg/dL (ref 6–23)
CALCIUM: 9.8 mg/dL (ref 8.4–10.5)
CHLORIDE: 98 meq/L (ref 96–112)
CO2: 29 mEq/L (ref 19–32)
Creatinine, Ser: 1.5 mg/dL — ABNORMAL HIGH (ref 0.4–1.2)
GFR: 36.11 mL/min — ABNORMAL LOW (ref >60.00)
Glucose, Bld: 91 mg/dL (ref 70–99)
POTASSIUM: 4.4 meq/L (ref 3.5–5.1)
Sodium: 135 mEq/L (ref 135–145)
Total Bilirubin: 0.8 mg/dL (ref 0.3–1.2)
Total Protein: 6.7 g/dL (ref 6.0–8.3)

## 2013-11-13 LAB — SODIUM: SODIUM: 135 meq/L (ref 135–145)

## 2013-11-13 LAB — TSH: TSH: 2.5 u[IU]/mL (ref 0.35–5.50)

## 2013-11-13 LAB — HEMOGLOBIN A1C: HEMOGLOBIN A1C: 6.4 % (ref 4.6–6.5)

## 2013-11-13 NOTE — Progress Notes (Signed)
Pre-visit discussion using our clinic review tool. No additional management support is needed unless otherwise documented below in the visit note.  

## 2013-11-13 NOTE — Progress Notes (Signed)
Subjective:    Patient ID: Alice Delgado, female    DOB: 1929-08-11, 80 y.o.   MRN: [id number]  HPI 80 year old female with past history of hypertension, hypercholesterolemia and hypothyroidism who comes in today for a scheduled follow up.   She reports that she was having some increased cough and was evaluated at acute care on 10/05/13.  Was given a zpak.  Symptoms persisted and she was seen at [facility].  Was placed on Augmentin.  After she took a dose, she developed some itching and her hands turned red.  Began feeling sick.  Positive emesis and diarrhea.  Later became red all over.  Had face and lip swelling.  Throat felt like it was closing.  911 called.  Pt taken to ER. Had cxr, EKG and labs in ER.  Placed on steroids.  See last note for details.  ACE inhibitor was changed to Losartan.  She did see Dr Chancy Milroy last week.  Planning for PFTs 11/28/13.  Has f/u with him next week.  Still with some sob with exertion.  She overall feels better.  Cough better.  Taking nexium and zantac.  Acid better.  Overall doing better.       Past Medical History  Diagnosis Date  . Hypertension   . GERD (gastroesophageal reflux disease)   . Hypercholesterolemia   . Hyperglycemia   . Vitamin D deficiency   . Hypothyroidism   . Peptic ulcer disease   . Diverticulosis   . Pernicious anemia   . Osteoarthritis     lumbar disc dz, cervical disc dz, hands  . Osteoporosis     intolerance to fosamax, evista and miacalcin    Current Outpatient Prescriptions on File Prior to Visit  Medication Sig Dispense Refill  . acetaminophen (TYLENOL) 325 MG tablet Take 650 mg by mouth every 6 (six) hours as needed.      . cetirizine (ZYRTEC) 10 MG tablet Take 10 mg by mouth daily as needed for allergies.       . cyanocobalamin (,VITAMIN B-12,) 1000 MCG/ML injection Inject 1,000 mcg into the muscle every 30 (thirty) days.      Marland Kitchen esomeprazole (NEXIUM) 40 MG capsule Take 1 capsule (40 mg total) by mouth daily.  30  capsule  5  . fluticasone (FLONASE) 50 MCG/ACT nasal spray Place 2 sprays into the nose daily as needed.      . fluticasone (FLOVENT HFA) 110 MCG/ACT inhaler Inhale 2 puffs into the lungs 2 (two) times daily.  1 Inhaler  1  . HYDROcodone-acetaminophen (VICODIN) 5-325 mg TABS tablet One tablet q day to bid prn  40 tablet  0  . levothyroxine (SYNTHROID, LEVOTHROID) 25 MCG tablet Take 1 tablet (25 mcg total) by mouth daily.  90 tablet  3  . losartan (COZAAR) 25 MG tablet Take 1 tablet (25 mg total) by mouth daily.  30 tablet  2  . triamterene-hydrochlorothiazide (MAXZIDE-25) 37.5-25 MG per tablet Take 1 each (1 tablet total) by mouth daily.  90 tablet  3   No current facility-administered medications on file prior to visit.    Review of Systems Patient denies any headache, lightheadedness or dizziness.  No sinus symptoms.  No chest pain or palpitations.  Does report sob with exertion.  No chest congestion.  Cough better.   Using inhaler.  No nausea or vomiting now.  Acid reflux under control on Nexium and zantac.   No bowel change, such as diarrhea.  Eating.   Off  lisinopril.  Cough better.  States blood pressure doing well.  Seeing Dr Chancy Milroy.        Objective:   Physical Exam  Filed Vitals:   11/13/13 1136  BP: 130/68  Pulse: 82  Temp: 98 F (96.34 C)   80 year old female in no acute distress.   HEENT:  Nares- clear.   Oropharynx - without lesions.    NECK:  Supple.  Nontender.  No audible bruit.  HEART:  Appears to be regular. LUNGS:  No crackles or wheezing audible.  Respirations even and unlabored.  RADIAL PULSE:  Equal bilaterally.    ABDOMEN:  Soft.  Non tender.  No rebound or guarding.   Bowel sounds present and normal.  No audible abdominal bruit.  EXTREMITIES:  No increased edema present.  DP pulses palpable and equal bilaterally.            Assessment & Plan:  HEALTH MAINTENANCE.  Physical 11/23/12.  She is s/p hysterectomy.  Had pap - 09/22/11 - negative. Colonoscopy as  outlined.  Desires no further medication for her bones.  Continue calcium and vitamin D.  Last vitamin D level checked wnl.  Mammogram 02/16/13 - Birads II.

## 2013-11-14 ENCOUNTER — Other Ambulatory Visit: Payer: Self-pay | Admitting: Internal Medicine

## 2013-11-14 DIAGNOSIS — N289 Disorder of kidney and ureter, unspecified: Secondary | ICD-10-CM

## 2013-11-14 LAB — VITAMIN D 25 HYDROXY (VIT D DEFICIENCY, FRACTURES): VIT D 25 HYDROXY: 20 ng/mL — AB (ref 30–89)

## 2013-11-14 NOTE — Progress Notes (Signed)
Orders placed for f/u labs.  

## 2013-11-15 ENCOUNTER — Ambulatory Visit: Payer: Medicare Other | Admitting: Internal Medicine

## 2013-11-17 ENCOUNTER — Other Ambulatory Visit: Payer: Self-pay | Admitting: *Deleted

## 2013-11-17 ENCOUNTER — Encounter: Payer: Self-pay | Admitting: Internal Medicine

## 2013-11-17 NOTE — Assessment & Plan Note (Signed)
Low cholesterol diet and exercise.  Follow lipid panel.   

## 2013-11-17 NOTE — Assessment & Plan Note (Signed)
Had a significant allergic reaction.  Went to ER.  W/up as outlined.  Was placed on steroid taper.  Feeling better.  Eating.

## 2013-11-17 NOTE — Assessment & Plan Note (Signed)
Better.  See last note.  Off lisinopril.  On zyrtec.  Continue inhaler use.  Seeing pulmonary.  Planning for PFTs.  Follow.

## 2013-11-17 NOTE — Assessment & Plan Note (Signed)
A1c last checked 6.5.  Sees Dr Dingledein for her eye exams.  Low carb diet.  Follow.

## 2013-11-17 NOTE — Assessment & Plan Note (Signed)
Appears to be under control.  Continue same medication regimen.

## 2013-11-17 NOTE — Assessment & Plan Note (Signed)
On synthroid.  Follow tsh.   

## 2013-11-17 NOTE — Assessment & Plan Note (Signed)
Found to have an elevated white blood cell count in the ER.  Probably related to the stress reaction and steroids.  Recheck white blood cell count - wnl.

## 2013-11-17 NOTE — Telephone Encounter (Signed)
Pt to pick up Vitamin D3 OTC 1,000 daily

## 2013-11-17 NOTE — Assessment & Plan Note (Signed)
Reported sob with exertion.  See last note for details.  Had stress test.  Negative.  Seeing pulmonary now.  Scheduled for PFTs 11/28/13.  Follow.  Cough better since off lisinopril.

## 2013-11-17 NOTE — Assessment & Plan Note (Signed)
Most recent check improved.  Follow.

## 2013-11-17 NOTE — Assessment & Plan Note (Signed)
Lisinopril was stopped secondary to concern that this was contributing to her cough.  Was started on losartan 25mg  q day.  Blood pressure ok  Follow.

## 2013-11-19 ENCOUNTER — Other Ambulatory Visit (INDEPENDENT_AMBULATORY_CARE_PROVIDER_SITE_OTHER): Payer: Medicare Other

## 2013-11-19 DIAGNOSIS — N289 Disorder of kidney and ureter, unspecified: Secondary | ICD-10-CM

## 2013-11-19 DIAGNOSIS — E7800 Pure hypercholesterolemia, unspecified: Secondary | ICD-10-CM

## 2013-11-19 LAB — URINALYSIS, ROUTINE W REFLEX MICROSCOPIC
Bilirubin Urine: NEGATIVE
Hgb urine dipstick: NEGATIVE
KETONES UR: NEGATIVE
Leukocytes, UA: NEGATIVE
Nitrite: NEGATIVE
RBC / HPF: NONE SEEN (ref 0–?)
Specific Gravity, Urine: 1.01 (ref 1.000–1.030)
Total Protein, Urine: NEGATIVE
UROBILINOGEN UA: 0.2 (ref 0.0–1.0)
Urine Glucose: NEGATIVE
WBC, UA: NONE SEEN (ref 0–?)
pH: 5.5 (ref 5.0–8.0)

## 2013-11-19 LAB — BASIC METABOLIC PANEL
BUN: 12 mg/dL (ref 6–23)
CALCIUM: 9.5 mg/dL (ref 8.4–10.5)
CO2: 28 mEq/L (ref 19–32)
Chloride: 100 mEq/L (ref 96–112)
Creatinine, Ser: 1.5 mg/dL — ABNORMAL HIGH (ref 0.4–1.2)
GFR: 34.74 mL/min — AB (ref >60.00)
GLUCOSE: 101 mg/dL — AB (ref 70–99)
Potassium: 4 mEq/L (ref 3.5–5.1)
SODIUM: 136 meq/L (ref 135–145)

## 2013-11-19 LAB — LIPID PANEL
CHOL/HDL RATIO: 5
Cholesterol: 202 mg/dL — ABNORMAL HIGH (ref 0–200)
HDL: 44.4 mg/dL (ref >39.00)
Triglycerides: 199 mg/dL — ABNORMAL HIGH (ref 0.0–149.0)
VLDL: 39.8 mg/dL (ref 0.0–40.0)

## 2013-11-19 LAB — LDL CHOLESTEROL, DIRECT: Direct LDL: 131.1 mg/dL

## 2013-11-20 ENCOUNTER — Other Ambulatory Visit: Payer: Self-pay | Admitting: Internal Medicine

## 2013-11-20 DIAGNOSIS — N289 Disorder of kidney and ureter, unspecified: Secondary | ICD-10-CM

## 2013-11-20 NOTE — Progress Notes (Signed)
Order placed for met b and renal ultrasound.

## 2013-11-21 ENCOUNTER — Telehealth: Payer: Self-pay | Admitting: *Deleted

## 2013-11-21 NOTE — Telephone Encounter (Signed)
Pt advised to decrease Triam/HCTZ to 1/2 tab daily based on lab results

## 2013-11-24 ENCOUNTER — Other Ambulatory Visit: Payer: Self-pay | Admitting: Internal Medicine

## 2013-11-26 ENCOUNTER — Ambulatory Visit: Payer: Self-pay | Admitting: Internal Medicine

## 2013-12-03 ENCOUNTER — Other Ambulatory Visit: Payer: Medicare Other

## 2013-12-04 ENCOUNTER — Other Ambulatory Visit: Payer: Medicare Other

## 2013-12-19 ENCOUNTER — Other Ambulatory Visit: Payer: Medicare Other

## 2013-12-24 ENCOUNTER — Encounter: Payer: Self-pay | Admitting: Internal Medicine

## 2013-12-24 ENCOUNTER — Ambulatory Visit (INDEPENDENT_AMBULATORY_CARE_PROVIDER_SITE_OTHER): Payer: Medicare Other | Admitting: Internal Medicine

## 2013-12-24 VITALS — BP 130/70 | HR 73 | Temp 98.4°F | Ht 60.5 in | Wt 139.0 lb

## 2013-12-24 DIAGNOSIS — E119 Type 2 diabetes mellitus without complications: Secondary | ICD-10-CM

## 2013-12-24 DIAGNOSIS — Z1239 Encounter for other screening for malignant neoplasm of breast: Secondary | ICD-10-CM

## 2013-12-24 DIAGNOSIS — N289 Disorder of kidney and ureter, unspecified: Secondary | ICD-10-CM

## 2013-12-24 DIAGNOSIS — E559 Vitamin D deficiency, unspecified: Secondary | ICD-10-CM

## 2013-12-24 DIAGNOSIS — R05 Cough: Secondary | ICD-10-CM

## 2013-12-24 DIAGNOSIS — I10 Essential (primary) hypertension: Secondary | ICD-10-CM

## 2013-12-24 DIAGNOSIS — E7800 Pure hypercholesterolemia, unspecified: Secondary | ICD-10-CM

## 2013-12-24 DIAGNOSIS — E871 Hypo-osmolality and hyponatremia: Secondary | ICD-10-CM

## 2013-12-24 DIAGNOSIS — K219 Gastro-esophageal reflux disease without esophagitis: Secondary | ICD-10-CM

## 2013-12-24 DIAGNOSIS — R059 Cough, unspecified: Secondary | ICD-10-CM

## 2013-12-24 DIAGNOSIS — E039 Hypothyroidism, unspecified: Secondary | ICD-10-CM

## 2013-12-24 MED ORDER — ESOMEPRAZOLE MAGNESIUM 40 MG PO CPDR: 40.0000 mg | DELAYED_RELEASE_CAPSULE | Freq: Two times a day (BID) | ORAL | Status: DC

## 2013-12-24 MED ORDER — CYANOCOBALAMIN 1000 MCG/ML IJ SOLN: 1000.0000 ug | INTRAMUSCULAR | Status: DC

## 2013-12-24 NOTE — Progress Notes (Signed)
Pre-visit discussion using our clinic review tool. No additional management support is needed unless otherwise documented below in the visit note.  

## 2013-12-25 ENCOUNTER — Encounter: Payer: Self-pay | Admitting: Internal Medicine

## 2013-12-25 DIAGNOSIS — N183 Chronic kidney disease, stage 3 unspecified: Secondary | ICD-10-CM | POA: Insufficient documentation

## 2013-12-25 LAB — BASIC METABOLIC PANEL
BUN: 12 mg/dL (ref 6–23)
CO2: 26 mEq/L (ref 19–32)
Calcium: 9.8 mg/dL (ref 8.4–10.5)
Chloride: 99 mEq/L (ref 96–112)
Creatinine, Ser: 1.3 mg/dL — ABNORMAL HIGH (ref 0.4–1.2)
GFR: 40.87 mL/min — ABNORMAL LOW (ref >60.00)
Glucose, Bld: 94 mg/dL (ref 70–99)
Potassium: 3.7 mEq/L (ref 3.5–5.1)
SODIUM: 134 meq/L — AB (ref 135–145)

## 2013-12-25 NOTE — Assessment & Plan Note (Addendum)
Most recent check wnl.  Follow.

## 2013-12-25 NOTE — Assessment & Plan Note (Signed)
Better.  See last note.  Off lisinopril.  On zyrtec.  Continue inhaler use.  Seeing pulmonary.

## 2013-12-25 NOTE — Assessment & Plan Note (Signed)
Lisinopril was stopped secondary to concern that this was contributing to her cough.  Was started on losartan 25mg  q day.  Blood pressure ok  Follow.

## 2013-12-25 NOTE — Assessment & Plan Note (Signed)
A1c last checked 6.4.  Sees Dr Dingledein for her eye exams.  Low carb diet.  Follow.

## 2013-12-25 NOTE — Assessment & Plan Note (Addendum)
Concern over persistent problems.  AM nausea.  Will need to confirm hydrocodone not contributing.  Will increase nexium to bid.  Take zantac at night.  Discussed my desire to refer to GI for further evaluation, given persistent issues despite medications.  She declines.  Wants to follow.  Get her back in soon to reassess.

## 2013-12-25 NOTE — Assessment & Plan Note (Signed)
Low cholesterol diet and exercise.  Follow lipid panel.   

## 2013-12-25 NOTE — Progress Notes (Signed)
Subjective:    Patient ID: Alice Delgado, female    DOB: September 30, 1929, 80 y.o.   MRN: [id number]  HPI 80 year old female with past history of hypertension, hypercholesterolemia and hypothyroidism who comes in today to follow up on these issues as well as for a complete physical exam.   She was having some increased cough and was evaluated at acute care on 10/05/13.  Was given a zpak.  Symptoms persisted and she was seen at [facility].  Was placed on Augmentin.  After she took a dose, she developed some itching and her hands turned red.  Began feeling sick.  Positive emesis and diarrhea.  Later became red all over.  Had face and lip swelling.  Throat felt like it was closing.  911 called.  Pt taken to ER. Had cxr, EKG and labs in ER.  Placed on steroids.  See previous notes for details.  ACE inhibitor was changed to Losartan. She is seeing Dr Chancy Milroy. Has pulmonary function tests.  Has been told she has bronchial asthma and COPD.   She overall feels better.  Cough better.  Using [employer].  Has the rescue inhaler if needed.  Not as tight.  Has f/u with Dr Chancy Milroy at the end of 3/15.  Taking nexium and zantac.  Still having issues.  Describes am nausea.  No vomiting.  Bowels stable.        Past Medical History  Diagnosis Date  . Hypertension   . GERD (gastroesophageal reflux disease)   . Hypercholesterolemia   . Hyperglycemia   . Vitamin D deficiency   . Hypothyroidism   . Peptic ulcer disease   . Diverticulosis   . Pernicious anemia   . Osteoarthritis     lumbar disc dz, cervical disc dz, hands  . Osteoporosis     intolerance to fosamax, evista and miacalcin    Current Outpatient Prescriptions on File Prior to Visit  Medication Sig Dispense Refill  . acetaminophen (TYLENOL) 325 MG tablet Take 650 mg by mouth every 6 (six) hours as needed.      . cetirizine (ZYRTEC) 10 MG tablet Take 10 mg by mouth daily as needed for allergies.       . fluticasone (FLONASE) 50 MCG/ACT nasal spray  Place 2 sprays into the nose daily as needed.      Marland Kitchen HYDROcodone-acetaminophen (VICODIN) 5-325 mg TABS tablet One tablet q day to bid prn  40 tablet  0  . levothyroxine (SYNTHROID, LEVOTHROID) 25 MCG tablet Take 1 tablet (25 mcg total) by mouth daily.  90 tablet  3  . losartan (COZAAR) 25 MG tablet Take 1 tablet (25 mg total) by mouth daily.  30 tablet  2  . triamterene-hydrochlorothiazide (MAXZIDE-25) 37.5-25 MG per tablet Take 0.5 tablets by mouth daily.      Marland Kitchen triamterene-hydrochlorothiazide (MAXZIDE-25) 37.5-25 MG per tablet TAKE ONE TABLET BY MOUTH EVERY DAY  90 tablet  1   No current facility-administered medications on file prior to visit.    Review of Systems Patient denies any headache, lightheadedness or dizziness.  No sinus symptoms.  No chest pain or palpitations.   No chest congestion.  Cough better.   Using inhalers.   No vomiting.  AM nausea as outlined.   No bowel change, such as diarrhea.  Eating.   Off lisinopril.  Cough better.  States blood pressure doing well.  Seeing Dr Chancy Milroy.         Objective:   Physical Exam  Filed Vitals:   12/24/13 1538  BP: 130/70  Pulse: 73  Temp: 98.4 F (36.9 C)   Blood pressure recheck:  2/9  80 year old female in no acute distress.   HEENT:  Nares- clear.  Oropharynx - without lesions. NECK:  Supple.  Nontender.  No audible bruit.  HEART:  Appears to be regular. LUNGS:  No crackles or wheezing audible.  Respirations even and unlabored.  RADIAL PULSE:  Equal bilaterally.    BREASTS:  No nipple discharge or nipple retraction present.  Could not appreciate any distinct nodules or axillary adenopathy.  ABDOMEN:  Soft, nontender.  Bowel sounds present and normal.  No audible abdominal bruit.  GU:  Not performed.    EXTREMITIES:  No increased edema present.  DP pulses palpable and equal bilaterally.   FEET:  No lesions.           Assessment & Plan:  HEALTH MAINTENANCE.  Physical today.  She is s/p hysterectomy.  Had pap - 09/22/11 -  negative. Colonoscopy as outlined.  Desires no further medication for her bones.  Continue calcium and vitamin D.  Last vitamin D level checked wnl.  Mammogram 02/16/13 - Birads II.   Schedule f/u mammogram when due.    I spent 40 minutes with the patient and more than 50% of the time was spent in consultation regarding the above.

## 2013-12-25 NOTE — Assessment & Plan Note (Signed)
On synthroid.  Follow tsh.   

## 2013-12-25 NOTE — Assessment & Plan Note (Signed)
Continue supplements.  Follow.

## 2013-12-25 NOTE — Assessment & Plan Note (Signed)
Triam/hctz decreased to 1/2 tablet q day.  Staying hydrated.  Recheck metabolic panel today.

## 2013-12-27 ENCOUNTER — Other Ambulatory Visit: Payer: Self-pay | Admitting: Internal Medicine

## 2013-12-27 ENCOUNTER — Encounter: Payer: Self-pay | Admitting: *Deleted

## 2013-12-27 DIAGNOSIS — E871 Hypo-osmolality and hyponatremia: Secondary | ICD-10-CM

## 2013-12-27 NOTE — Progress Notes (Signed)
Order placed for f/u sodium.  ?

## 2013-12-29 ENCOUNTER — Other Ambulatory Visit: Payer: Self-pay | Admitting: Internal Medicine

## 2014-01-10 ENCOUNTER — Other Ambulatory Visit: Payer: Medicare Other

## 2014-01-14 ENCOUNTER — Other Ambulatory Visit (INDEPENDENT_AMBULATORY_CARE_PROVIDER_SITE_OTHER): Payer: Medicare Other

## 2014-01-14 DIAGNOSIS — E871 Hypo-osmolality and hyponatremia: Secondary | ICD-10-CM

## 2014-01-14 LAB — SODIUM: SODIUM: 133 meq/L — AB (ref 135–145)

## 2014-01-15 ENCOUNTER — Other Ambulatory Visit: Payer: Self-pay | Admitting: Internal Medicine

## 2014-01-15 ENCOUNTER — Encounter: Payer: Self-pay | Admitting: *Deleted

## 2014-01-15 DIAGNOSIS — E871 Hypo-osmolality and hyponatremia: Secondary | ICD-10-CM

## 2014-01-15 NOTE — Progress Notes (Signed)
Order placed for f/u sodium.  ?

## 2014-02-05 ENCOUNTER — Encounter: Payer: Self-pay | Admitting: *Deleted

## 2014-02-05 ENCOUNTER — Other Ambulatory Visit (INDEPENDENT_AMBULATORY_CARE_PROVIDER_SITE_OTHER): Payer: Medicare Other

## 2014-02-05 DIAGNOSIS — E871 Hypo-osmolality and hyponatremia: Secondary | ICD-10-CM

## 2014-02-05 LAB — SODIUM: SODIUM: 136 meq/L (ref 135–145)

## 2014-02-25 ENCOUNTER — Ambulatory Visit: Payer: Medicare Other | Admitting: Internal Medicine

## 2014-03-18 ENCOUNTER — Encounter: Payer: Self-pay | Admitting: Internal Medicine

## 2014-03-19 ENCOUNTER — Encounter: Payer: Self-pay | Admitting: Internal Medicine

## 2014-03-19 ENCOUNTER — Ambulatory Visit (INDEPENDENT_AMBULATORY_CARE_PROVIDER_SITE_OTHER): Payer: Medicare Other | Admitting: Internal Medicine

## 2014-03-19 ENCOUNTER — Ambulatory Visit: Payer: Self-pay | Admitting: Physician Assistant

## 2014-03-19 VITALS — BP 130/70 | HR 79 | Temp 98.6°F | Ht 60.5 in | Wt 137.5 lb

## 2014-03-19 DIAGNOSIS — R05 Cough: Secondary | ICD-10-CM

## 2014-03-19 DIAGNOSIS — E871 Hypo-osmolality and hyponatremia: Secondary | ICD-10-CM

## 2014-03-19 DIAGNOSIS — R634 Abnormal weight loss: Secondary | ICD-10-CM

## 2014-03-19 DIAGNOSIS — D72829 Elevated white blood cell count, unspecified: Secondary | ICD-10-CM

## 2014-03-19 DIAGNOSIS — N289 Disorder of kidney and ureter, unspecified: Secondary | ICD-10-CM

## 2014-03-19 DIAGNOSIS — E119 Type 2 diabetes mellitus without complications: Secondary | ICD-10-CM

## 2014-03-19 DIAGNOSIS — E039 Hypothyroidism, unspecified: Secondary | ICD-10-CM

## 2014-03-19 DIAGNOSIS — E559 Vitamin D deficiency, unspecified: Secondary | ICD-10-CM

## 2014-03-19 DIAGNOSIS — K219 Gastro-esophageal reflux disease without esophagitis: Secondary | ICD-10-CM

## 2014-03-19 DIAGNOSIS — E7800 Pure hypercholesterolemia, unspecified: Secondary | ICD-10-CM

## 2014-03-19 DIAGNOSIS — I10 Essential (primary) hypertension: Secondary | ICD-10-CM

## 2014-03-19 DIAGNOSIS — R059 Cough, unspecified: Secondary | ICD-10-CM

## 2014-03-19 MED ORDER — HYDROCODONE-ACETAMINOPHEN 5-325MG PREPACK (~~LOC~~: ORAL_TABLET | ORAL | Status: DC

## 2014-03-19 NOTE — Progress Notes (Signed)
Pre visit review using our clinic review tool, if applicable. No additional management support is needed unless otherwise documented below in the visit note. 

## 2014-03-24 ENCOUNTER — Encounter: Payer: Self-pay | Admitting: Internal Medicine

## 2014-03-24 DIAGNOSIS — R634 Abnormal weight loss: Secondary | ICD-10-CM | POA: Insufficient documentation

## 2014-03-24 NOTE — Assessment & Plan Note (Signed)
Triam/hctz decreased to 1/2 tablet q day.  Stay hydrated.  Follow metabolic panel.

## 2014-03-24 NOTE — Progress Notes (Signed)
Subjective:    Patient ID: Alice Delgado, female    DOB: Mar 17, 1929, 80 y.o.   MRN: [id number]  HPI 80 year old female with past history of hypertension, hypercholesterolemia and hypothyroidism who comes in today for a scheduled follow up.  She is accompanied by her daughter.  History obtained from both of them.  She was having some increased cough and was evaluated at acute care on 10/05/13.  Was given a zpak.  Symptoms persisted and she was seen at [facility].  Was placed on Augmentin.  After she took a dose, she developed some itching and her hands turned red.  Began feeling sick.  Positive emesis and diarrhea.  Later became red all over.  Had face and lip swelling.  Throat felt like it was closing.  911 called.  Pt taken to ER. Had cxr, EKG and labs in ER.  Placed on steroids.  See previous notes for details.  ACE inhibitor was changed to Losartan. She is seeing Dr Chancy Milroy. Had pulmonary function tests.  Had been told she has bronchial asthma and COPD.   She is still seeing Dr Chancy Milroy.  Had an appt yesterday.  Still with some persistent cough.  Planning for UGI and chest and sinus CT.  She overall feels better.    Taking nexium and zantac.  Nausea is better.  See last note.  No vomiting.  Bowels stable.   Weight is decreasing.  Some decreased appetite.  Discussed diet and po intake today.       Past Medical History  Diagnosis Date  . Hypertension   . GERD (gastroesophageal reflux disease)   . Hypercholesterolemia   . Hyperglycemia   . Vitamin D deficiency   . Hypothyroidism   . Peptic ulcer disease   . Diverticulosis   . Pernicious anemia   . Osteoarthritis     lumbar disc dz, cervical disc dz, hands  . Osteoporosis     intolerance to fosamax, evista and miacalcin    Current Outpatient Prescriptions on File Prior to Visit  Medication Sig Dispense Refill  . acetaminophen (TYLENOL) 325 MG tablet Take 650 mg by mouth every 6 (six) hours as needed.      . cetirizine (ZYRTEC) 10 MG  tablet Take 10 mg by mouth daily as needed for allergies.       . cyanocobalamin (,VITAMIN B-12,) 1000 MCG/ML injection Inject 1 mL (1,000 mcg total) into the muscle every 30 (thirty) days.  10 mL  1  . esomeprazole (NEXIUM) 40 MG capsule Take 1 capsule (40 mg total) by mouth 2 (two) times daily before a meal.  60 capsule  5  . fluticasone (FLONASE) 50 MCG/ACT nasal spray Place 2 sprays into the nose daily as needed.      Marland Kitchen levothyroxine (SYNTHROID, LEVOTHROID) 25 MCG tablet TAKE ONE TABLET BY MOUTH EVERY DAY  90 tablet  2  . losartan (COZAAR) 25 MG tablet Take 1 tablet (25 mg total) by mouth daily.  30 tablet  2  . triamterene-hydrochlorothiazide (MAXZIDE-25) 37.5-25 MG per tablet Take 0.5 tablets by mouth daily.      Marland Kitchen triamterene-hydrochlorothiazide (MAXZIDE-25) 37.5-25 MG per tablet TAKE ONE TABLET BY MOUTH EVERY DAY  90 tablet  1   No current facility-administered medications on file prior to visit.    Review of Systems Patient denies any headache, lightheadedness or dizziness.  No sinus symptoms.  No chest pain or palpitations. Persistent cough as outlined.   Using inhalers.   No vomiting.  Nausea better/resolved.  No bowel change, such as diarrhea.  Eating, but has decreased appetite.  Has lost weight.  Seeing Dr Chancy Milroy.  Planning for UGI and chest and sinus CT.        Objective:   Physical Exam  Filed Vitals:   03/19/14 1617  BP: 130/70  Pulse: 79  Temp: 98.6 F (37 C)   Blood pressure recheck:  31/50  80 year old female in no acute distress.   HEENT:  Nares- clear.  Oropharynx - without lesions. NECK:  Supple.  Nontender.  No audible bruit.  HEART:  Appears to be regular. LUNGS:  No crackles or wheezing audible.  Respirations even and unlabored.  RADIAL PULSE:  Equal bilaterally.   ABDOMEN:  Soft, nontender.  Bowel sounds present and normal.  No audible abdominal bruit.  EXTREMITIES:  No increased edema present.  DP pulses palpable and equal bilaterally.   FEET:  No  lesions.           Assessment & Plan:  HEALTH MAINTENANCE.  Physical last visit.  She is s/p hysterectomy.  Had pap - 09/22/11 - negative. Colonoscopy as outlined.  Desires no further medication for her bones.  Continue calcium and vitamin D.  Last vitamin D level checked wnl.  Mammogram 02/16/13 - Birads II.   Schedule f/u mammogram when due.    I spent 25 minutes with the patient and more than 50% of the time was spent in consultation regarding the above.

## 2014-03-24 NOTE — Assessment & Plan Note (Signed)
Most recent check 02/05/14 - wnl.

## 2014-03-24 NOTE — Assessment & Plan Note (Signed)
Last check wnl.

## 2014-03-24 NOTE — Assessment & Plan Note (Signed)
Persistent.  Was 156 pounds 12/13 and 141 pounds 10/2013.  Today 137.8.  Planning UGI and chest and sinus CT as outlined.  We discussed increased po intake.  Discussed further w/up.  Will start with tests planned as outlined. Follow.

## 2014-03-24 NOTE — Assessment & Plan Note (Signed)
Low cholesterol diet and exercise.  Follow lipid panel.   

## 2014-03-24 NOTE — Assessment & Plan Note (Signed)
AM nausea resolved.  Continue medications as she is doing.  Planning for UGI as outlined.  Follow.

## 2014-03-24 NOTE — Assessment & Plan Note (Signed)
Lisinopril was stopped secondary to concern that this was contributing to her cough.  Was started on losartan 25mg  q day.  Blood pressure ok  Follow.

## 2014-03-24 NOTE — Assessment & Plan Note (Signed)
On synthroid.  Follow tsh. TSH checked 11/13/13 wnl.

## 2014-03-24 NOTE — Assessment & Plan Note (Signed)
Continue supplements.  Follow.

## 2014-03-24 NOTE — Assessment & Plan Note (Signed)
Persistent cough and congestion.  Seeing Dr Chancy Milroy.  Just evaluated yesterday.  Scheduled for UGI and chest and sinus CT.  Further w/up pending.  Continue inhalers as she is doing.

## 2014-03-24 NOTE — Assessment & Plan Note (Signed)
A1c last checked 6.4.  Sees Dr Dingledein for her eye exams.  Low carb diet.  Follow.

## 2014-03-26 ENCOUNTER — Ambulatory Visit: Payer: Self-pay | Admitting: Internal Medicine

## 2014-05-21 ENCOUNTER — Other Ambulatory Visit: Payer: Medicare Other

## 2014-05-30 ENCOUNTER — Ambulatory Visit: Payer: Medicare Other | Admitting: Internal Medicine

## 2014-06-22 ENCOUNTER — Emergency Department: Payer: Self-pay | Admitting: Internal Medicine

## 2014-06-22 LAB — URINALYSIS, COMPLETE
Bacteria: NONE SEEN
Bilirubin,UR: NEGATIVE
Blood: NEGATIVE
Glucose,UR: NEGATIVE mg/dL
Ketone: NEGATIVE
Leukocyte Esterase: NEGATIVE
Nitrite: NEGATIVE
Ph: 7
Protein: NEGATIVE
RBC,UR: <1 /HPF
Specific Gravity: 1.005
Squamous Epithelial: NONE SEEN
WBC UR: <1 /HPF

## 2014-06-22 LAB — COMPREHENSIVE METABOLIC PANEL
ALBUMIN: 3.5 g/dL (ref 3.4–5.0)
ALK PHOS: 85 U/L
ALT: 15 U/L
AST: 15 U/L (ref 15–37)
Anion Gap: 8 (ref 7–16)
BILIRUBIN TOTAL: 0.6 mg/dL (ref 0.2–1.0)
BUN: 13 mg/dL (ref 7–18)
CREATININE: 1.28 mg/dL (ref 0.60–1.30)
Calcium, Total: 9.2 mg/dL (ref 8.5–10.1)
Chloride: 102 mmol/L (ref 98–107)
Co2: 26 mmol/L (ref 21–32)
EGFR (Non-African Amer.): 39 — ABNORMAL LOW
GFR CALC AF AMER: 45 — AB
GLUCOSE: 104 mg/dL — AB (ref 65–99)
OSMOLALITY: 272 (ref 275–301)
Potassium: 3.5 mmol/L (ref 3.5–5.1)
Sodium: 136 mmol/L (ref 136–145)
Total Protein: 7.2 g/dL (ref 6.4–8.2)

## 2014-06-22 LAB — CBC
HCT: 35 % (ref 35.0–47.0)
HGB: 10.9 g/dL — ABNORMAL LOW (ref 12.0–16.0)
MCH: 25.1 pg — ABNORMAL LOW (ref 26.0–34.0)
MCHC: 31.1 g/dL — AB (ref 32.0–36.0)
MCV: 81 fL (ref 80–100)
PLATELETS: 173 10*3/uL (ref 150–440)
RBC: 4.35 10*6/uL (ref 3.80–5.20)
RDW: 15.6 % — AB (ref 11.5–14.5)
WBC: 7.5 10*3/uL (ref 3.6–11.0)

## 2014-07-09 ENCOUNTER — Ambulatory Visit: Payer: Self-pay | Admitting: Anesthesiology

## 2014-07-09 ENCOUNTER — Ambulatory Visit: Payer: Medicare Other | Admitting: Cardiovascular Disease

## 2014-07-11 ENCOUNTER — Ambulatory Visit: Payer: Medicare Other | Admitting: Cardiovascular Disease

## 2014-07-18 ENCOUNTER — Ambulatory Visit: Payer: Medicare Other | Admitting: Cardiovascular Disease

## 2014-07-26 ENCOUNTER — Ambulatory Visit: Payer: Medicare Other | Admitting: Internal Medicine

## 2014-07-30 ENCOUNTER — Ambulatory Visit (INDEPENDENT_AMBULATORY_CARE_PROVIDER_SITE_OTHER): Payer: Medicare Other | Admitting: Cardiovascular Disease

## 2014-07-30 ENCOUNTER — Encounter: Payer: Self-pay | Admitting: Cardiovascular Disease

## 2014-07-30 ENCOUNTER — Other Ambulatory Visit: Payer: Medicare Other

## 2014-07-30 ENCOUNTER — Encounter (INDEPENDENT_AMBULATORY_CARE_PROVIDER_SITE_OTHER): Payer: Self-pay

## 2014-07-30 VITALS — BP 178/62 | HR 54 | Ht 61.0 in | Wt 131.2 lb

## 2014-07-30 DIAGNOSIS — I499 Cardiac arrhythmia, unspecified: Secondary | ICD-10-CM

## 2014-07-30 DIAGNOSIS — I10 Essential (primary) hypertension: Secondary | ICD-10-CM

## 2014-07-30 DIAGNOSIS — R002 Palpitations: Secondary | ICD-10-CM

## 2014-07-30 MED ORDER — SPIRONOLACTONE 25 MG PO TABS: 25.0000 mg | ORAL_TABLET | Freq: Every day | ORAL | Status: DC

## 2014-07-30 NOTE — Progress Notes (Signed)
Primary care physician: Dr. Clayborn Bigness  HPI  This is an 80 year old female who was referred for evaluation of difficult to treat hypertension and palpitations. She is not aware of any previous cardiac history. She has chronic medical conditions that include hypertension, hyperlipidemia, chronic back pain, hypothyroidism and asthma she reports prolonged history of hypertension which has been difficult to control recently mostly due to intolerance to multiple medications. Bystolic caused bradycardia. ACE inhibitors/ARB caused cough. Hydrochlorothiazide caused hyponatremia and hypokalemia according to the patient. Hydralazine worsened lower extremity edema. She was recently started on amlodipine with some improvement in blood pressure. She reports intermittent palpitations with no syncope or presyncope. Pressure readings at home are better than in the office but still not controlled.  Allergies  Allergen Reactions  . Augmentin [Amoxicillin-Pot Clavulanate]   . Evista [Raloxifene]     Leg discomfort   . Fosamax [Alendronate Sodium]     Gi intolerance   . Lipitor [Atorvastatin]     Bloating and GI upset  . Miralax [Polyethylene Glycol]     Question of hives   . Penicillins Hives  . Zetia [Ezetimibe]      Current Outpatient Prescriptions on File Prior to Visit  Medication Sig Dispense Refill  . acetaminophen (TYLENOL) 325 MG tablet Take 650 mg by mouth every 6 (six) hours as needed.      . cyanocobalamin (,VITAMIN B-12,) 1000 MCG/ML injection Inject 1 mL (1,000 mcg total) into the muscle every 30 (thirty) days.  10 mL  1  . HYDROcodone-acetaminophen (VICODIN) 5-325 mg TABS tablet One tablet q day to bid prn  40 tablet  0   No current facility-administered medications on file prior to visit.     Past Medical History  Diagnosis Date  . Hypertension   . GERD (gastroesophageal reflux disease)   . Hypercholesterolemia   . Hyperglycemia   . Vitamin D deficiency   . Hypothyroidism   .  Peptic ulcer disease   . Diverticulosis   . Pernicious anemia   . Osteoarthritis     lumbar disc dz, cervical disc dz, hands  . Osteoporosis     intolerance to fosamax, evista and miacalcin  . Asthma   . Hiatal hernia      Past Surgical History  Procedure Laterality Date  . Abdominal hysterectomy      with benign ovarian tumor removed.    . Lumbar fusion       Family History  Problem Relation Age of Onset  . Heart disease Father     myocardial infarction  . Heart attack Father   . Cancer Mother     cancer of lymph nodes and kidney  . Breast cancer Sister   . Colon cancer Neg Hx      History   Social History  . Marital Status: Widowed    Spouse Name: N/A    Number of Children: 2  . Years of Education: N/A   Occupational History  . Not on file.   Social History Main Topics  . Smoking status: Former Smoker -- 15 years    Types: Cigarettes  . Smokeless tobacco: Never Used  . Alcohol Use: No  . Drug Use: No  . Sexual Activity: Not on file   Other Topics Concern  . Not on file   Social History Narrative  . No narrative on file     ROS A 10 point review of system was performed. It is negative other than that mentioned in the history of  present illness.   PHYSICAL EXAM   BP 178/62  Pulse 54  Ht 5\' 1"  (1.549 m)  Wt 131 lb 4 oz (59.535 kg)  BMI 24.81 kg/m2 Constitutional: She is oriented to person, place, and time. She appears well-developed and well-nourished. No distress.  HENT: No nasal discharge.  Head: Normocephalic and atraumatic.  Eyes: Pupils are equal and round. No discharge.  Neck: Normal range of motion. Neck supple. No JVD present. No thyromegaly present.  Cardiovascular: Normal rate, regular rhythm, normal heart sounds. Exam reveals no gallop and no friction rub. No murmur heard.  Pulmonary/Chest: Effort normal and breath sounds normal. No stridor. No respiratory distress. She has no wheezes. She has no rales. She exhibits no tenderness.   Abdominal: Soft. Bowel sounds are normal. She exhibits no distension. There is no tenderness. There is no rebound and no guarding.  Musculoskeletal: Normal range of motion. She exhibits no edema and no tenderness. No abdominal bruit  Neurological: She is alert and oriented to person, place, and time. Coordination normal.  Skin: Skin is warm and dry. No rash noted. She is not diaphoretic. No erythema. No pallor.  Psychiatric: She has a normal mood and affect. Her behavior is normal. Judgment and thought content normal.     EKG: Sinus  Bradycardia  -With rate variation  cv = 11. WITHIN NORMAL LIMITS    ASSESSMENT AND PLAN

## 2014-07-30 NOTE — Patient Instructions (Signed)
Your physician has recommended you make the following change in your medication:  Start Aldactone 25 mg once daily   Your physician recommends that you return for lab work in:  BMP the same day as your renal artery duplex   Your physician has requested that you have a renal artery duplex. During this test, an ultrasound is used to evaluate blood flow to the kidneys. Allow one hour for this exam. Do not eat after midnight the day before and avoid carbonated beverages. Take your medications as you usually do.  Your physician has recommended that you wear a holter monitor. Holter monitors are medical devices that record the heart's electrical activity. Doctors most often use these monitors to diagnose arrhythmias. Arrhythmias are problems with the speed or rhythm of the heartbeat. The monitor is a small, portable device. You can wear one while you do your normal daily activities. This is usually used to diagnose what is causing palpitations/syncope (passing out).  Your physician recommends that you schedule a follow-up appointment in:  1 month with Dr. Fletcher Anon

## 2014-07-30 NOTE — Assessment & Plan Note (Signed)
She reports intermittent palpitations in spite of being mostly bradycardic. I requested a 48-hour Holter monitor for evaluation.

## 2014-07-30 NOTE — Assessment & Plan Note (Signed)
The patient has refractory hypertension with intolerance to multiple medications. Given her age, I think we should exclude renal artery stenosis. I requested a renal artery duplex. I added spironolactone 25 mg once daily. Check basic metabolic profile in one to 2 weeks. Continue to monitor blood pressure.

## 2014-08-06 ENCOUNTER — Ambulatory Visit: Payer: Self-pay | Admitting: Anesthesiology

## 2014-08-15 ENCOUNTER — Other Ambulatory Visit: Payer: Medicare Other

## 2014-08-20 ENCOUNTER — Other Ambulatory Visit (INDEPENDENT_AMBULATORY_CARE_PROVIDER_SITE_OTHER): Payer: Medicare Other

## 2014-08-20 ENCOUNTER — Encounter (INDEPENDENT_AMBULATORY_CARE_PROVIDER_SITE_OTHER): Payer: Medicare Other

## 2014-08-20 DIAGNOSIS — I10 Essential (primary) hypertension: Secondary | ICD-10-CM

## 2014-08-21 DIAGNOSIS — R002 Palpitations: Secondary | ICD-10-CM

## 2014-08-21 LAB — BASIC METABOLIC PANEL
BUN/Creatinine Ratio: 12 (ref 11–26)
BUN: 16 mg/dL (ref 8–27)
CALCIUM: 9.9 mg/dL (ref 8.7–10.3)
CO2: 23 mmol/L (ref 18–29)
CREATININE: 1.34 mg/dL — AB (ref 0.57–1.00)
Chloride: 99 mmol/L (ref 97–108)
GFR calc Af Amer: 42 mL/min/{1.73_m2} — ABNORMAL LOW (ref >59)
GFR, EST NON AFRICAN AMERICAN: 37 mL/min/{1.73_m2} — AB (ref >59)
GLUCOSE: 95 mg/dL (ref 65–99)
Potassium: 4.6 mmol/L (ref 3.5–5.2)
Sodium: 141 mmol/L (ref 134–144)

## 2014-08-26 ENCOUNTER — Ambulatory Visit: Payer: Medicare Other | Admitting: Internal Medicine

## 2014-09-02 ENCOUNTER — Encounter: Payer: Self-pay | Admitting: Cardiovascular Disease

## 2014-09-02 ENCOUNTER — Ambulatory Visit (INDEPENDENT_AMBULATORY_CARE_PROVIDER_SITE_OTHER): Payer: Medicare Other | Admitting: Cardiovascular Disease

## 2014-09-02 VITALS — BP 166/60 | HR 55 | Ht 61.0 in | Wt 131.8 lb

## 2014-09-02 DIAGNOSIS — I10 Essential (primary) hypertension: Secondary | ICD-10-CM

## 2014-09-02 DIAGNOSIS — R002 Palpitations: Secondary | ICD-10-CM

## 2014-09-02 MED ORDER — HYDRALAZINE HCL 25 MG PO TABS
25.0000 mg | ORAL_TABLET | Freq: Three times a day (TID) | ORAL | Status: DC
Start: 2014-09-02 — End: 2015-04-22

## 2014-09-02 NOTE — Assessment & Plan Note (Addendum)
Renal artery duplex showed no evidence of renal artery stenosis. Blood pressure improved after the addition of spironolactone. Home blood pressure readings are actually significantly better than office readings. Nonetheless, the blood pressure is still not optimally controlled. Thus, I increased the dose of hydralazine to 25 mg 3 times daily. I will have her come back in 2 months and repeat basic metabolic profile at that time.

## 2014-09-02 NOTE — Progress Notes (Signed)
Primary care physician: Dr. Clayborn Bigness  HPI  This is an 80 year old female who was referred for evaluation of difficult to treat hypertension and palpitations. She is not aware of any previous cardiac history. She has chronic medical conditions that include hypertension, hyperlipidemia, chronic back pain, hypothyroidism and asthma.  she reports prolonged history of hypertension which has been difficult to control recently mostly due to intolerance to multiple medications. Bystolic caused bradycardia. ACE inhibitors/ARB caused cough. Hydrochlorothiazide caused hyponatremia and hypokalemia according to the patient. Hydralazine worsened lower extremity edema. She was recently started on amlodipine with some improvement in blood pressure. She reports intermittent palpitations with no syncope or presyncope. Pressure readings at home were better than in the office but still not controlled. During last visit,I requested renal artery duplex which showed no evidence of renal artery stenosis. I added spironolactone 25 mg once daily. Basic metabolic profile showed no evidence of hyperkalemia with a creatinine of 1.37. A Holter monitor was done for palpitations which showed occasional PACs and PVCs with very short runs of SVT.  Allergies  Allergen Reactions  . Augmentin [Amoxicillin-Pot Clavulanate]   . Evista [Raloxifene]     Leg discomfort   . Fosamax [Alendronate Sodium]     Gi intolerance   . Lipitor [Atorvastatin]     Bloating and GI upset  . Miralax [Polyethylene Glycol]     Question of hives   . Penicillins Hives  . Zetia [Ezetimibe]      Current Outpatient Prescriptions on File Prior to Visit  Medication Sig Dispense Refill  . albuterol (PROAIR HFA) 108 (90 BASE) MCG/ACT inhaler Inhale 1 puff into the lungs every 6 (six) hours as needed for wheezing or shortness of breath.    Marland Kitchen amLODipine (NORVASC) 5 MG tablet Take 5 mg by mouth daily.    . Azelastine HCl 0.15 % SOLN Place into the nose  as needed.    . cyanocobalamin (,VITAMIN B-12,) 1000 MCG/ML injection Inject 1 mL (1,000 mcg total) into the muscle every 30 (thirty) days. 10 mL 1  . esomeprazole (NEXIUM) 40 MG capsule Take 40 mg by mouth daily at 12 noon.    Marland Kitchen HYDROcodone-acetaminophen (VICODIN) 5-325 mg TABS tablet One tablet q day to bid prn 40 tablet 0  . montelukast (SINGULAIR) 10 MG tablet Take 10 mg by mouth at bedtime.    . nebivolol (BYSTOLIC) 5 MG tablet Take 5 mg by mouth daily.     No current facility-administered medications on file prior to visit.     Past Medical History  Diagnosis Date  . Hypertension   . GERD (gastroesophageal reflux disease)   . Hypercholesterolemia   . Hyperglycemia   . Vitamin D deficiency   . Hypothyroidism   . Peptic ulcer disease   . Diverticulosis   . Pernicious anemia   . Osteoarthritis     lumbar disc dz, cervical disc dz, hands  . Osteoporosis     intolerance to fosamax, evista and miacalcin  . Asthma   . Hiatal hernia      Past Surgical History  Procedure Laterality Date  . Abdominal hysterectomy      with benign ovarian tumor removed.    . Lumbar fusion       Family History  Problem Relation Age of Onset  . Heart disease Father     myocardial infarction  . Heart attack Father   . Cancer Mother     cancer of lymph nodes and kidney  . Breast cancer Sister   .  Colon cancer Neg Hx      History   Social History  . Marital Status: Widowed    Spouse Name: N/A    Number of Children: 2  . Years of Education: N/A   Occupational History  . Not on file.   Social History Main Topics  . Smoking status: Former Smoker -- 15 years    Types: Cigarettes  . Smokeless tobacco: Never Used  . Alcohol Use: No  . Drug Use: No  . Sexual Activity: Not on file   Other Topics Concern  . Not on file   Social History Narrative     ROS A 10 point review of system was performed. It is negative other than that mentioned in the history of present  illness.   PHYSICAL EXAM   BP 166/60 mmHg  Pulse 55  Ht 5\' 1"  (1.549 m)  Wt 131 lb 12.8 oz (59.784 kg)  BMI 24.92 kg/m2  SpO2 97% Constitutional: She is oriented to person, place, and time. She appears well-developed and well-nourished. No distress.  HENT: No nasal discharge.  Head: Normocephalic and atraumatic.  Eyes: Pupils are equal and round. No discharge.  Neck: Normal range of motion. Neck supple. No JVD present. No thyromegaly present.  Cardiovascular: Normal rate, regular rhythm, normal heart sounds. Exam reveals no gallop and no friction rub. No murmur heard.  Pulmonary/Chest: Effort normal and breath sounds normal. No stridor. No respiratory distress. She has no wheezes. She has no rales. She exhibits no tenderness.  Abdominal: Soft. Bowel sounds are normal. She exhibits no distension. There is no tenderness. There is no rebound and no guarding.  Musculoskeletal: Normal range of motion. She exhibits no edema and no tenderness. No abdominal bruit  Neurological: She is alert and oriented to person, place, and time. Coordination normal.  Skin: Skin is warm and dry. No rash noted. She is not diaphoretic. No erythema. No pallor.  Psychiatric: She has a normal mood and affect. Her behavior is normal. Judgment and thought content normal.      ASSESSMENT AND PLAN

## 2014-09-02 NOTE — Assessment & Plan Note (Signed)
Symptoms are overall mild. Holter monitor showed occasional PACs, PVCs and very short runs of SVT. Continue small dose Bystolic. She does have baseline bradycardia which makes it difficult to up titrate the dose.

## 2014-09-02 NOTE — Patient Instructions (Signed)
Your physician has recommended you make the following change in your medication:  Increase Hydralazine to 25 mg three times daily   Your physician recommends that you schedule a follow-up appointment in:  2 months with Dr. Fletcher Anon

## 2014-09-09 ENCOUNTER — Ambulatory Visit (INDEPENDENT_AMBULATORY_CARE_PROVIDER_SITE_OTHER): Payer: Medicare Other

## 2014-09-09 ENCOUNTER — Other Ambulatory Visit: Payer: Self-pay

## 2014-09-09 DIAGNOSIS — R002 Palpitations: Secondary | ICD-10-CM

## 2014-10-03 ENCOUNTER — Ambulatory Visit: Payer: Self-pay | Admitting: Anesthesiology

## 2014-11-04 ENCOUNTER — Encounter: Payer: Self-pay | Admitting: Cardiovascular Disease

## 2014-11-04 ENCOUNTER — Ambulatory Visit (INDEPENDENT_AMBULATORY_CARE_PROVIDER_SITE_OTHER): Payer: Medicare Other | Admitting: Cardiovascular Disease

## 2014-11-04 VITALS — BP 152/60 | HR 60 | Ht 61.0 in | Wt 138.5 lb

## 2014-11-04 DIAGNOSIS — R002 Palpitations: Secondary | ICD-10-CM

## 2014-11-04 DIAGNOSIS — I10 Essential (primary) hypertension: Secondary | ICD-10-CM | POA: Diagnosis not present

## 2014-11-04 NOTE — Assessment & Plan Note (Signed)
Blood pressure is now reasonably controlled on current medications. Home blood pressure readings are actually much better than the office blood pressure readings. She is tolerating current medications. I requested basic metabolic profile given that she is on spironolactone.

## 2014-11-04 NOTE — Assessment & Plan Note (Signed)
Symptoms are controlled on small dose Bystolic.

## 2014-11-04 NOTE — Patient Instructions (Signed)
Labs today (BMP)  Continue same medications.   Your physician wants you to follow-up in: 6 months.  You will receive a reminder letter in the mail two months in advance. If you don't receive a letter, please call our office to schedule the follow-up appointment.

## 2014-11-04 NOTE — Progress Notes (Signed)
Primary care physician: Dr. Clayborn Bigness  HPI  This is an 81 year old female who is here today for a follow-up visit regarding refractory hypertension and palpitations. She has chronic medical conditions that include hypertension, hyperlipidemia, chronic back pain, hypothyroidism and asthma.  she reports prolonged history of hypertension which has been difficult to control recently mostly due to intolerance to multiple medications. Bystolic caused bradycardia. ACE inhibitors/ARB caused cough. Hydrochlorothiazide caused hyponatremia and hypokalemia according to the patient. Hydralazine worsened lower extremity edema.  Renal artery duplex  showed no evidence of renal artery stenosis.  A Holter monitor was done for palpitations which showed occasional PACs and PVCs with very short runs of SVT. She has been doing reasonably well and has been tolerating antihypertensive medications. She denies any chest pain. Dyspnea and palpitations are stable.  Allergies  Allergen Reactions  . Augmentin [Amoxicillin-Pot Clavulanate]   . Evista [Raloxifene]     Leg discomfort   . Fosamax [Alendronate Sodium]     Gi intolerance   . Lipitor [Atorvastatin]     Bloating and GI upset  . Miralax [Polyethylene Glycol]     Question of hives   . Penicillins Hives  . Zetia [Ezetimibe]      Current Outpatient Prescriptions on File Prior to Visit  Medication Sig Dispense Refill  . albuterol (PROAIR HFA) 108 (90 BASE) MCG/ACT inhaler Inhale 1 puff into the lungs every 6 (six) hours as needed for wheezing or shortness of breath.    Marland Kitchen amLODipine (NORVASC) 5 MG tablet Take 5 mg by mouth daily.    . Azelastine HCl 0.15 % SOLN Place into the nose as needed.    . cyanocobalamin (,VITAMIN B-12,) 1000 MCG/ML injection Inject 1 mL (1,000 mcg total) into the muscle every 30 (thirty) days. 10 mL 1  . esomeprazole (NEXIUM) 40 MG capsule Take 40 mg by mouth daily at 12 noon.    . hydrALAZINE (APRESOLINE) 25 MG tablet Take 1  tablet (25 mg total) by mouth 3 (three) times daily. 90 tablet 6  . HYDROcodone-acetaminophen (VICODIN) 5-325 mg TABS tablet One tablet q day to bid prn 40 tablet 0  . montelukast (SINGULAIR) 10 MG tablet Take 10 mg by mouth at bedtime.    . nebivolol (BYSTOLIC) 5 MG tablet Take 5 mg by mouth daily.    Marland Kitchen spironolactone (ALDACTONE) 25 MG tablet Take 25 mg by mouth daily.     No current facility-administered medications on file prior to visit.     Past Medical History  Diagnosis Date  . Hypertension   . GERD (gastroesophageal reflux disease)   . Hypercholesterolemia   . Hyperglycemia   . Vitamin D deficiency   . Hypothyroidism   . Peptic ulcer disease   . Diverticulosis   . Pernicious anemia   . Osteoarthritis     lumbar disc dz, cervical disc dz, hands  . Osteoporosis     intolerance to fosamax, evista and miacalcin  . Asthma   . Hiatal hernia      Past Surgical History  Procedure Laterality Date  . Abdominal hysterectomy      with benign ovarian tumor removed.    . Lumbar fusion       Family History  Problem Relation Age of Onset  . Heart disease Father     myocardial infarction  . Heart attack Father   . Cancer Mother     cancer of lymph nodes and kidney  . Breast cancer Sister   . Colon cancer Neg  Hx      History   Social History  . Marital Status: Widowed    Spouse Name: N/A    Number of Children: 2  . Years of Education: N/A   Occupational History  . Not on file.   Social History Main Topics  . Smoking status: Former Smoker -- 15 years    Types: Cigarettes  . Smokeless tobacco: Never Used  . Alcohol Use: No  . Drug Use: No  . Sexual Activity: Not on file   Other Topics Concern  . Not on file   Social History Narrative     ROS A 10 point review of system was performed. It is negative other than that mentioned in the history of present illness.   PHYSICAL EXAM   BP 152/60 mmHg  Pulse 60  Ht 5\' 1"  (1.549 m)  Wt 138 lb 8 oz (62.823  kg)  BMI 26.18 kg/m2 Constitutional: She is oriented to person, place, and time. She appears well-developed and well-nourished. No distress.  HENT: No nasal discharge.  Head: Normocephalic and atraumatic.  Eyes: Pupils are equal and round. No discharge.  Neck: Normal range of motion. Neck supple. No JVD present. No thyromegaly present.  Cardiovascular: Normal rate, regular rhythm, normal heart sounds. Exam reveals no gallop and no friction rub. No murmur heard.  Pulmonary/Chest: Effort normal and breath sounds normal. No stridor. No respiratory distress. She has no wheezes. She has no rales. She exhibits no tenderness.  Abdominal: Soft. Bowel sounds are normal. She exhibits no distension. There is no tenderness. There is no rebound and no guarding.  Musculoskeletal: Normal range of motion. She exhibits no edema and no tenderness. No abdominal bruit  Neurological: She is alert and oriented to person, place, and time. Coordination normal.  Skin: Skin is warm and dry. No rash noted. She is not diaphoretic. No erythema. No pallor.  Psychiatric: She has a normal mood and affect. Her behavior is normal. Judgment and thought content normal.      ASSESSMENT AND PLAN

## 2014-11-05 LAB — BASIC METABOLIC PANEL
BUN/Creatinine Ratio: 11 (ref 11–26)
BUN: 13 mg/dL (ref 8–27)
CO2: 22 mmol/L (ref 18–29)
Calcium: 9.4 mg/dL (ref 8.7–10.3)
Chloride: 101 mmol/L (ref 97–108)
Creatinine, Ser: 1.16 mg/dL — ABNORMAL HIGH (ref 0.57–1.00)
GFR calc Af Amer: 50 mL/min/{1.73_m2} — ABNORMAL LOW (ref >59)
GFR calc non Af Amer: 44 mL/min/{1.73_m2} — ABNORMAL LOW (ref >59)
GLUCOSE: 90 mg/dL (ref 65–99)
Potassium: 4.1 mmol/L (ref 3.5–5.2)
Sodium: 140 mmol/L (ref 134–144)

## 2014-12-19 DIAGNOSIS — J309 Allergic rhinitis, unspecified: Secondary | ICD-10-CM | POA: Diagnosis not present

## 2014-12-19 DIAGNOSIS — R05 Cough: Secondary | ICD-10-CM | POA: Diagnosis not present

## 2014-12-19 DIAGNOSIS — I10 Essential (primary) hypertension: Secondary | ICD-10-CM | POA: Diagnosis not present

## 2015-02-07 NOTE — Op Note (Signed)
PATIENT NAME:  Alice Delgado, Alice Delgado MR#:  [id number] DATE OF BIRTH:  Dec 25, 1928  DATE OF PROCEDURE:  07/16/2013  PREOPERATIVE DIAGNOSIS: Cataract, right eye.   POSTOPERATIVE DIAGNOSIS: Cataract, right eye.   PROCEDURE PERFORMED: Extracapsular cataract extraction using phacoemulsification with placement of  Alcon [id number], 21.5 diopter posterior chamber lens, serial number [id number].   SURGEON: Loura Back. Reese Stockman, M.D.   ANESTHESIA: 4% lidocaine and 0.75% Marcaine a 50-50 mixture with 10 units/mL of Hylenex added, given as a peribulbar.   ANESTHESIOLOGIST: Dr. Jonna Coup.   COMPLICATIONS: None.   ESTIMATED BLOOD LOSS: Less than 1 mL.   DESCRIPTION OF PROCEDURE:  The patient was brought to the operating room and given a peribulbar block.  The patient was then prepped and draped in the usual fashion.  The vertical rectus muscles were imbricated using 5-0 silk sutures.  These sutures were then clamped to the sterile drapes as bridle sutures.  A limbal peritomy was performed extending two clock hours and hemostasis was obtained with cautery.  A partial thickness scleral groove was made at the surgical limbus and dissected anteriorly in a lamellar dissection using an Alcon crescent knife.  The anterior chamber was entered superonasally with a Superblade and through the lamellar dissection with a 2.6 mm keratome.  DisCoVisc was used to replace the aqueous and a continuous tear capsulorrhexis was carried out.  Hydrodissection and hydrodelineation were carried out with balanced salt and a 27 gauge canula.  The nucleus was rotated to confirm the effectiveness of the hydrodissection.  Phacoemulsification was carried out using a divide-and-conquer technique.  Total ultrasound time was 1 minute and 18 seconds with an average power of 21.9 %. CDE of 31.58.  Irrigation/aspiration was used to remove the residual cortex.  DisCoVisc was used to inflate the capsule and the internal incision was enlarged to 3 mm  with the crescent knife.  The intraocular lens was folded and inserted into the capsular bag using the AcrySert delivery system Irrigation/aspiration was used to remove the residual DisCoVisc.  Miostat was injected into the anterior chamber through the paracentesis track to inflate the anterior chamber and induce miosis. A tenth of a mL of cefuroxime containing 1 mg of drug was injected through the paracentesis. The wound was checked for leaks and none were found. The conjunctiva was closed with cautery and the bridle sutures were removed.  Two drops of 0.3% Vigamox were placed on the eye.   An eye shield was placed on the eye.  The patient was discharged to the recovery room in good condition.    ____________________________ Loura Back Hearl Heikes, MD sad:sg D: 07/16/2013 13:12:13 ET T: 07/16/2013 15:06:57 ET JOB#: [id number]  cc: Remo Lipps A. Sarahmarie Leavey, MD, <Dictator> Martie Lee MD ELECTRONICALLY SIGNED 07/23/2013 13:24

## 2015-03-05 ENCOUNTER — Other Ambulatory Visit: Payer: Self-pay | Admitting: Cardiovascular Disease

## 2015-03-25 DIAGNOSIS — H2512 Age-related nuclear cataract, left eye: Secondary | ICD-10-CM | POA: Diagnosis not present

## 2015-04-22 ENCOUNTER — Other Ambulatory Visit: Payer: Self-pay | Admitting: Cardiovascular Disease

## 2015-05-01 DIAGNOSIS — Z79899 Other long term (current) drug therapy: Secondary | ICD-10-CM | POA: Diagnosis not present

## 2015-05-01 DIAGNOSIS — N182 Chronic kidney disease, stage 2 (mild): Secondary | ICD-10-CM | POA: Diagnosis not present

## 2015-05-01 DIAGNOSIS — Z0001 Encounter for general adult medical examination with abnormal findings: Secondary | ICD-10-CM | POA: Diagnosis not present

## 2015-05-01 DIAGNOSIS — I10 Essential (primary) hypertension: Secondary | ICD-10-CM | POA: Diagnosis not present

## 2015-05-01 DIAGNOSIS — M5137 Other intervertebral disc degeneration, lumbosacral region: Secondary | ICD-10-CM | POA: Diagnosis not present

## 2015-05-01 DIAGNOSIS — R05 Cough: Secondary | ICD-10-CM | POA: Diagnosis not present

## 2015-05-05 ENCOUNTER — Ambulatory Visit (INDEPENDENT_AMBULATORY_CARE_PROVIDER_SITE_OTHER): Payer: Medicare Other | Admitting: Cardiovascular Disease

## 2015-05-05 ENCOUNTER — Encounter: Payer: Self-pay | Admitting: Cardiovascular Disease

## 2015-05-05 VITALS — BP 175/69 | HR 50 | Ht 61.0 in | Wt 144.2 lb

## 2015-05-05 DIAGNOSIS — I10 Essential (primary) hypertension: Secondary | ICD-10-CM

## 2015-05-05 DIAGNOSIS — R002 Palpitations: Secondary | ICD-10-CM | POA: Diagnosis not present

## 2015-05-05 MED ORDER — HYDRALAZINE HCL 50 MG PO TABS: 50.0000 mg | ORAL_TABLET | Freq: Three times a day (TID) | ORAL | Status: DC

## 2015-05-05 NOTE — Patient Instructions (Signed)
Medication Instructions:  Your physician has recommended you make the following change in your medication:  INCREASE hydralazine to '50mg'$  three times per day   Labwork: none  Testing/Procedures: none  Follow-Up: Your physician wants you to follow-up in: six months with Dr. Fletcher Anon.  You will receive a reminder letter in the mail two months in advance. If you don't receive a letter, please call our office to schedule the follow-up appointment.   Any Other Special Instructions Will Be Listed Below (If Applicable).

## 2015-05-05 NOTE — Assessment & Plan Note (Signed)
Still not optimally controlled. Home blood pressure readings seem to be better than office blood pressure reading and she might have a component of white coat syndrome. The dose of Bystolic  cannot be increased any further due to baseline bradycardia. I increased the dose of hydralazine to 50 mg once daily.

## 2015-05-05 NOTE — Assessment & Plan Note (Signed)
Symptoms are controlled with small dose Bystolic.  she is mildly 82 cardiac but seems to be asymptomatic.

## 2015-05-05 NOTE — Progress Notes (Signed)
Primary care physician: Dr. Clayborn Bigness  HPI  This is an 81 year old female who is here today for a follow-up visit regarding refractory hypertension and palpitations. She has chronic medical conditions that include hypertension, hyperlipidemia, chronic back pain, hypothyroidism and asthma.  she reports prolonged history of hypertension which has been difficult to control recently mostly due to intolerance to multiple medications. Bystolic caused bradycardia. ACE inhibitors/ARB caused cough. Hydrochlorothiazide caused hyponatremia and hypokalemia according to the patient. Hydralazine worsened lower extremity edema.  Renal artery duplex  showed no evidence of renal artery stenosis.  A Holter monitor was done for palpitations which showed occasional PACs and PVCs with very short runs of SVT. Blood pressure at home runs between 140-155 according to the patient. She might have a component of white coat syndrome. Otherwise she has been doing reasonably well and denies any chest pain or palpitations.  Allergies  Allergen Reactions  . Augmentin [Amoxicillin-Pot Clavulanate]   . Evista [Raloxifene]     Leg discomfort   . Fosamax [Alendronate Sodium]     Gi intolerance   . Lipitor [Atorvastatin]     Bloating and GI upset  . Miralax [Polyethylene Glycol]     Question of hives   . Penicillins Hives  . Zetia [Ezetimibe]      Current Outpatient Prescriptions on File Prior to Visit  Medication Sig Dispense Refill  . albuterol (PROAIR HFA) 108 (90 BASE) MCG/ACT inhaler Inhale 1 puff into the lungs every 6 (six) hours as needed for wheezing or shortness of breath.    Marland Kitchen amLODipine (NORVASC) 5 MG tablet Take 5 mg by mouth daily.    . Azelastine HCl 0.15 % SOLN Place into the nose as needed.    . cyanocobalamin (,VITAMIN B-12,) 1000 MCG/ML injection Inject 1 mL (1,000 mcg total) into the muscle every 30 (thirty) days. 10 mL 1  . esomeprazole (NEXIUM) 40 MG capsule Take 40 mg by mouth daily at 12 noon.     . hydrALAZINE (APRESOLINE) 25 MG tablet TAKE 1 TABLET BY MOUTH THREE TIMES DAILY 90 tablet 3  . HYDROcodone-acetaminophen (VICODIN) 5-325 mg TABS tablet One tablet q day to bid prn 40 tablet 0  . ipratropium-albuterol (DUONEB) 0.5-2.5 (3) MG/3ML SOLN Take 3 mLs by nebulization as needed.    . montelukast (SINGULAIR) 10 MG tablet Take 10 mg by mouth at bedtime.    . nebivolol (BYSTOLIC) 5 MG tablet Take 5 mg by mouth daily.    Marland Kitchen spironolactone (ALDACTONE) 25 MG tablet Take 25 mg by mouth daily.     No current facility-administered medications on file prior to visit.     Past Medical History  Diagnosis Date  . Hypertension   . GERD (gastroesophageal reflux disease)   . Hypercholesterolemia   . Hyperglycemia   . Vitamin D deficiency   . Hypothyroidism   . Peptic ulcer disease   . Diverticulosis   . Pernicious anemia   . Osteoarthritis     lumbar disc dz, cervical disc dz, hands  . Osteoporosis     intolerance to fosamax, evista and miacalcin  . Asthma   . Hiatal hernia      Past Surgical History  Procedure Laterality Date  . Abdominal hysterectomy      with benign ovarian tumor removed.    . Lumbar fusion       Family History  Problem Relation Age of Onset  . Heart disease Father     myocardial infarction  . Heart attack Father   .  Cancer Mother     cancer of lymph nodes and kidney  . Breast cancer Sister   . Colon cancer Neg Hx      History   Social History  . Marital Status: Widowed    Spouse Name: N/A  . Number of Children: 2  . Years of Education: N/A   Occupational History  . Not on file.   Social History Main Topics  . Smoking status: Former Smoker -- 15 years    Types: Cigarettes  . Smokeless tobacco: Never Used  . Alcohol Use: No  . Drug Use: No  . Sexual Activity: Not on file   Other Topics Concern  . Not on file   Social History Narrative     ROS A 10 point review of system was performed. It is negative other than that mentioned  in the history of present illness.   PHYSICAL EXAM   BP 175/69 mmHg  Pulse 50  Ht '5\' 1"'$  (1.549 m)  Wt 144 lb 4 oz (65.431 kg)  BMI 27.27 kg/m2 Constitutional: She is oriented to person, place, and time. She appears well-developed and well-nourished. No distress.  HENT: No nasal discharge.  Head: Normocephalic and atraumatic.  Eyes: Pupils are equal and round. No discharge.  Neck: Normal range of motion. Neck supple. No JVD present. No thyromegaly present.  Cardiovascular: Normal rate, regular rhythm, normal heart sounds. Exam reveals no gallop and no friction rub. No murmur heard.  Pulmonary/Chest: Effort normal and breath sounds normal. No stridor. No respiratory distress. She has no wheezes. She has no rales. She exhibits no tenderness.  Abdominal: Soft. Bowel sounds are normal. She exhibits no distension. There is no tenderness. There is no rebound and no guarding.  Musculoskeletal: Normal range of motion. She exhibits no edema and no tenderness. No abdominal bruit  Neurological: She is alert and oriented to person, place, and time. Coordination normal.  Skin: Skin is warm and dry. No rash noted. She is not diaphoretic. No erythema. No pallor.  Psychiatric: She has a normal mood and affect. Her behavior is normal. Judgment and thought content normal.    EKG: Sinus  Bradycardia  -First degree A-V block  PRi = 260 BORDERLINE RHYTHM  ASSESSMENT AND PLAN

## 2015-05-08 DIAGNOSIS — Z1231 Encounter for screening mammogram for malignant neoplasm of breast: Secondary | ICD-10-CM | POA: Diagnosis not present

## 2015-05-14 DIAGNOSIS — D519 Vitamin B12 deficiency anemia, unspecified: Secondary | ICD-10-CM | POA: Diagnosis not present

## 2015-05-14 DIAGNOSIS — M5417 Radiculopathy, lumbosacral region: Secondary | ICD-10-CM | POA: Diagnosis not present

## 2015-05-14 DIAGNOSIS — I10 Essential (primary) hypertension: Secondary | ICD-10-CM | POA: Diagnosis not present

## 2015-05-14 DIAGNOSIS — R05 Cough: Secondary | ICD-10-CM | POA: Diagnosis not present

## 2015-06-30 ENCOUNTER — Other Ambulatory Visit: Payer: Self-pay | Admitting: Cardiovascular Disease

## 2015-07-15 ENCOUNTER — Encounter: Payer: Self-pay | Admitting: Anesthesiology

## 2015-07-15 ENCOUNTER — Ambulatory Visit: Payer: Medicare Other | Attending: Anesthesiology | Admitting: Anesthesiology

## 2015-07-15 VITALS — BP 160/90 | HR 54 | Temp 97.4°F | Resp 16 | Ht 61.0 in | Wt 140.0 lb

## 2015-07-15 DIAGNOSIS — M545 Low back pain: Secondary | ICD-10-CM | POA: Insufficient documentation

## 2015-07-15 DIAGNOSIS — M1288 Other specific arthropathies, not elsewhere classified, other specified site: Secondary | ICD-10-CM | POA: Diagnosis not present

## 2015-07-15 DIAGNOSIS — M4806 Spinal stenosis, lumbar region: Secondary | ICD-10-CM | POA: Diagnosis not present

## 2015-07-15 DIAGNOSIS — M5136 Other intervertebral disc degeneration, lumbar region: Secondary | ICD-10-CM

## 2015-07-15 DIAGNOSIS — M47817 Spondylosis without myelopathy or radiculopathy, lumbosacral region: Secondary | ICD-10-CM

## 2015-07-15 MED ORDER — TRIAMCINOLONE ACETONIDE 40 MG/ML IJ SUSP
INTRAMUSCULAR | Status: AC
  Administered 2015-07-15: 15:00:00
  Filled 2015-07-15: qty 1

## 2015-07-15 MED ORDER — LIDOCAINE HCL (PF) 1 % IJ SOLN
INTRAMUSCULAR | Status: AC
Start: 2015-07-15 — End: 2015-07-15
  Administered 2015-07-15: 15:00:00
  Filled 2015-07-15: qty 5

## 2015-07-15 MED ORDER — IOHEXOL 180 MG/ML  SOLN
INTRAMUSCULAR | Status: AC
  Administered 2015-07-15: 15:00:00
  Filled 2015-07-15: qty 20

## 2015-07-15 MED ORDER — MIDAZOLAM HCL 5 MG/5ML IJ SOLN
INTRAMUSCULAR | Status: AC
  Administered 2015-07-15: 0.5 mg via INTRAVENOUS
  Filled 2015-07-15: qty 5

## 2015-07-15 MED ORDER — ROPIVACAINE HCL 2 MG/ML IJ SOLN
INTRAMUSCULAR | Status: AC
  Administered 2015-07-15: 15:00:00
  Filled 2015-07-15: qty 10

## 2015-07-15 MED ORDER — SODIUM CHLORIDE 0.9 % IJ SOLN
INTRAMUSCULAR | Status: AC
  Administered 2015-07-15: 15:00:00
  Filled 2015-07-15: qty 10

## 2015-07-15 NOTE — Progress Notes (Signed)
Safety precautions to be maintained throughout the outpatient stay will include: orient to surroundings, keep bed in low position, maintain call bell within reach at all times, provide assistance with transfer out of bed and ambulation.  

## 2015-07-15 NOTE — Progress Notes (Signed)
PROCEDURE PERFORMED: Lumbar epidural steroid injection under fluroscopic guidance with moderate sedation.at L5S1  CC:  Bilateral lower back pain   HPI:  Alice Delgado was last seen back in Dec. 2016.  She has had recurrence of the same quality and character low back pain over the past 2 or 3 months.  She did very well with the previous epidural with 5 months of excellent relief and desires to proceed with a repeat injection today.  No changes in bowel or bladder of lower extr strength or function are noted. The quality characters and distribution of her pain are similar to what she previously experienced prior to her last epidural in December.  Physical Exam:    PERRL, EOMI  Heart RRR   LCTA  Musculoskeletal: She has pain with extension at the low back and walks with an antalgic gait. Her strength appears to be at baseline with no changes in sensory or motor function. Spectral of her low back reveals some paraspinous muscle tenderness but no overt trigger points.  Assessment:  1.  Spinal stenosis with intractable low back pain  2.  Generative disc disease and facet arthropathy and persistent pain despite conservative therapy.  PLAN:   1.  As requested by the patient is reasonable to proceed with a repeat epidural steroids injection today. She done very well with these and these have enabled her to keep her pain under better control in the recent past. Unfortunately she has failed to gain sufficient relief with conservative therapy or medication management alone and following her previous epidural injection she was able to sleep better and function more reasonably during the day.  2.  We will have her return to clinic approximately 1 month for possible repeat epidural injection at that time.    Procedure:  LESI:  NOTE: The risks, benefits, and expectations of the procedure have been discussed and explained to the patient who was understanding and in agreement with suggested treatment plan. No  guarantees were made.  DESCRIPTION OF PROCEDURE: Lumbar epidural steroid injection with IV Versed, EKG, blood pressure, pulse, and pulse oximetry monitoring. The procedure was performed with the patient in the prone position under fluoroscopic guidance. A local anesthetic skin wheal of 1.5% plain lidocaine was performed at the appropriate site after fluoroscopic identifictation  Using strict aseptic technique, I then advanced an 18-gauge Tuohy epidural needle in the midline via loss-of-resistance  Technique. There was negative aspiration for negative aspiration for heme or  CSF.  I then confirmed position with both AP and Lateral fluoroscan. I injected 2 cc of Omnipaque yielding good epidural spread. A total of 5 mL of Preservative-Free normal saline with 40 mg of Kenalog and 1cc Ropicaine 0.2 percent was injected incrementally via the  epidurally placed needle. Needle removed. The patient tolerated the injection well.   '@James'$  Andree Elk, MD@

## 2015-07-15 NOTE — Patient Instructions (Signed)
Epidural Steroid Injection Patient Information  Description: The epidural space surrounds the nerves as they exit the spinal cord.  In some patients, the nerves can be compressed and inflamed by a bulging disc or a tight spinal canal (spinal stenosis).  By injecting steroids into the epidural space, we can bring irritated nerves into direct contact with a potentially helpful medication.  These steroids act directly on the irritated nerves and can reduce swelling and inflammation which often leads to decreased pain.  Epidural steroids may be injected anywhere along the spine and from the neck to the low back depending upon the location of your pain.   After numbing the skin with local anesthetic (like Novocaine), a small needle is passed into the epidural space slowly.  You may experience a sensation of pressure while this is being done.  The entire block usually last less than 10 minutes.  Conditions which may be treated by epidural steroids:   Low back and leg pain  Neck and arm pain  Spinal stenosis  Post-laminectomy syndrome  Herpes zoster (shingles) pain  Pain from compression fractures GENERAL RISKS AND COMPLICATIONS  What are the risk, side effects and possible complications? Generally speaking, most procedures are safe.  However, with any procedure there are risks, side effects, and the possibility of complications.  The risks and complications are dependent upon the sites that are lesioned, or the type of nerve block to be performed.  The closer the procedure is to the spine, the more serious the risks are.  Great care is taken when placing the radio frequency needles, block needles or lesioning probes, but sometimes complications can occur. 1. Infection: Any time there is an injection through the skin, there is a risk of infection.  This is why sterile conditions are used for these blocks.  There are four possible types of infection. 1. Localized skin infection. 2. Central Nervous  System Infection-This can be in the form of Meningitis, which can be deadly. 3. Epidural Infections-This can be in the form of an epidural abscess, which can cause pressure inside of the spine, causing compression of the spinal cord with subsequent paralysis. This would require an emergency surgery to decompress, and there are no guarantees that the patient would recover from the paralysis. 4. Discitis-This is an infection of the intervertebral discs.  It occurs in about 1% of discography procedures.  It is difficult to treat and it may lead to surgery.        2. Pain: the needles have to go through skin and soft tissues, will cause soreness.       3. Damage to internal structures:  The nerves to be lesioned may be near blood vessels or    other nerves which can be potentially damaged.       4. Bleeding: Bleeding is more common if the patient is taking blood thinners such as  aspirin, Coumadin, Ticiid, Plavix, etc., or if he/she have some genetic predisposition  such as hemophilia. Bleeding into the spinal canal can cause compression of the spinal  cord with subsequent paralysis.  This would require an emergency surgery to  decompress and there are no guarantees that the patient would recover from the  paralysis.       5. Pneumothorax:  Puncturing of a lung is a possibility, every time a needle is introduced in  the area of the chest or upper back.  Pneumothorax refers to free air around the  collapsed lung(s), inside of the thoracic cavity (  chest cavity).  Another two possible  complications related to a similar event would include: Hemothorax and Chylothorax.   These are variations of the Pneumothorax, where instead of air around the collapsed  lung(s), you may have blood or chyle, respectively.       6. Spinal headaches: They may occur with any procedures in the area of the spine.       7. Persistent CSF (Cerebro-Spinal Fluid) leakage: This is a rare problem, but may occur  with prolonged intrathecal or  epidural catheters either due to the formation of a fistulous  track or a dural tear.       8. Nerve damage: By working so close to the spinal cord, there is always a possibility of  nerve damage, which could be as serious as a permanent spinal cord injury with  paralysis.       9. Death:  Although rare, severe deadly allergic reactions known as "Anaphylactic  reaction" can occur to any of the medications used.      10. Worsening of the symptoms:  We can always make thing worse.  What are the chances of something like this happening? Chances of any of this occuring are extremely low.  By statistics, you have more of a chance of getting killed in a motor vehicle accident: while driving to the hospital than any of the above occurring .  Nevertheless, you should be aware that they are possibilities.  In general, it is similar to taking a shower.  Everybody knows that you can slip, hit your head and get killed.  Does that mean that you should not shower again?  Nevertheless always keep in mind that statistics do not mean anything if you happen to be on the wrong side of them.  Even if a procedure has a 1 (one) in a 1,000,000 (million) chance of going wrong, it you happen to be that one..Also, keep in mind that by statistics, you have more of a chance of having something go wrong when taking medications.  Who should not have this procedure? If you are on a blood thinning medication (e.g. Coumadin, Plavix, see list of "Blood Thinners"), or if you have an active infection going on, you should not have the procedure.  If you are taking any blood thinners, please inform your physician.  How should I prepare for this procedure?  Do not eat or drink anything at least six hours prior to the procedure.  Bring a driver with you .  It cannot be a taxi.  Come accompanied by an adult that can drive you back, and that is strong enough to help you if your legs get weak or numb from the local anesthetic.  Take all of  your medicines the morning of the procedure with just enough water to swallow them.  If you have diabetes, make sure that you are scheduled to have your procedure done first thing in the morning, whenever possible.  If you have diabetes, take only half of your insulin dose and notify our nurse that you have done so as soon as you arrive at the clinic.  If you are diabetic, but only take blood sugar pills (oral hypoglycemic), then do not take them on the morning of your procedure.  You may take them after you have had the procedure.  Do not take aspirin or any aspirin-containing medications, at least eleven (11) days prior to the procedure.  They may prolong bleeding.  Wear loose fitting clothing that may  be easy to take off and that you would not mind if it got stained with Betadine or blood.  Do not wear any jewelry or perfume  Remove any nail coloring.  It will interfere with some of our monitoring equipment.  NOTE: Remember that this is not meant to be interpreted as a complete list of all possible complications.  Unforeseen problems may occur.  BLOOD THINNERS The following drugs contain aspirin or other products, which can cause increased bleeding during surgery and should not be taken for 2 weeks prior to and 1 week after surgery.  If you should need take something for relief of minor pain, you may take acetaminophen which is found in Tylenol,m Datril, Anacin-3 and Panadol. It is not blood thinner. The products listed below are.  Do not take any of the products listed below in addition to any listed on your instruction sheet.  A.P.C or A.P.C with Codeine Codeine Phosphate Capsules #3 Ibuprofen Ridaura  ABC compound Congesprin Imuran rimadil  Advil Cope Indocin Robaxisal  Alka-Seltzer Effervescent Pain Reliever and Antacid Coricidin or Coricidin-D  Indomethacin Rufen  Alka-Seltzer plus Cold Medicine Cosprin Ketoprofen S-A-C Tablets  Anacin Analgesic Tablets or Capsules Coumadin  Korlgesic Salflex  Anacin Extra Strength Analgesic tablets or capsules CP-2 Tablets Lanoril Salicylate  Anaprox Cuprimine Capsules Levenox Salocol  Anexsia-D Dalteparin Magan Salsalate  Anodynos Darvon compound Magnesium Salicylate Sine-off  Ansaid Dasin Capsules Magsal Sodium Salicylate  Anturane Depen Capsules Marnal Soma  APF Arthritis pain formula Dewitt's Pills Measurin Stanback  Argesic Dia-Gesic Meclofenamic Sulfinpyrazone  Arthritis Bayer Timed Release Aspirin Diclofenac Meclomen Sulindac  Arthritis pain formula Anacin Dicumarol Medipren Supac  Analgesic (Safety coated) Arthralgen Diffunasal Mefanamic Suprofen  Arthritis Strength Bufferin Dihydrocodeine Mepro Compound Suprol  Arthropan liquid Dopirydamole Methcarbomol with Aspirin Synalgos  ASA tablets/Enseals Disalcid Micrainin Tagament  Ascriptin Doan's Midol Talwin  Ascriptin A/D Dolene Mobidin Tanderil  Ascriptin Extra Strength Dolobid Moblgesic Ticlid  Ascriptin with Codeine Doloprin or Doloprin with Codeine Momentum Tolectin  Asperbuf Duoprin Mono-gesic Trendar  Aspergum Duradyne Motrin or Motrin IB Triminicin  Aspirin plain, buffered or enteric coated Durasal Myochrisine Trigesic  Aspirin Suppositories Easprin Nalfon Trillsate  Aspirin with Codeine Ecotrin Regular or Extra Strength Naprosyn Uracel  Atromid-S Efficin Naproxen Ursinus  Auranofin Capsules Elmiron Neocylate Vanquish  Axotal Emagrin Norgesic Verin  Azathioprine Empirin or Empirin with Codeine Normiflo Vitamin E  Azolid Emprazil Nuprin Voltaren  Bayer Aspirin plain, buffered or children's or timed BC Tablets or powders Encaprin Orgaran Warfarin Sodium  Buff-a-Comp Enoxaparin Orudis Zorpin  Buff-a-Comp with Codeine Equegesic Os-Cal-Gesic   Buffaprin Excedrin plain, buffered or Extra Strength Oxalid   Bufferin Arthritis Strength Feldene Oxphenbutazone   Bufferin plain or Extra Strength Feldene Capsules Oxycodone with Aspirin   Bufferin with Codeine  Fenoprofen Fenoprofen Pabalate or Pabalate-SF   Buffets II Flogesic Panagesic   Buffinol plain or Extra Strength Florinal or Florinal with Codeine Panwarfarin   Buf-Tabs Flurbiprofen Penicillamine   Butalbital Compound Four-way cold tablets Penicillin   Butazolidin Fragmin Pepto-Bismol   Carbenicillin Geminisyn Percodan   Carna Arthritis Reliever Geopen Persantine   Carprofen Gold's salt Persistin   Chloramphenicol Goody's Phenylbutazone   Chloromycetin Haltrain Piroxlcam   Clmetidine heparin Plaquenil   Cllnoril Hyco-pap Ponstel   Clofibrate Hydroxy chloroquine Propoxyphen         Before stopping any of these medications, be sure to consult the physician who ordered them.  Some, such as Coumadin (Warfarin) are ordered to prevent or treat serious conditions such as "  deep thrombosis", "pumonary embolisms", and other heart problems.  The amount of time that you may need off of the medication may also vary with the medication and the reason for which you were taking it.  If you are taking any of these medications, please make sure you notify your pain physician before you undergo any procedures.         Pain Management Discharge Instructions  General Discharge Instructions :  If you need to reach your doctor call: Monday-Friday 8:00 am - 4:00 pm at [phone] or toll free [phone].  After clinic hours [phone] to have operator reach doctor.  Bring all of your medication bottles to all your appointments in the pain clinic.  To cancel or reschedule your appointment with Pain Management please remember to call 24 hours in advance to avoid a fee.  Refer to the educational materials which you have been given on: General Risks, I had my Procedure. Discharge Instructions, Post Sedation.  Post Procedure Instructions:  The drugs you were given will stay in your system until tomorrow, so for the next 24 hours you should not drive, make any legal decisions or drink any alcoholic  beverages.  You may eat anything you prefer, but it is better to start with liquids then soups and crackers, and gradually work up to solid foods.  Please notify your doctor immediately if you have any unusual bleeding, trouble breathing or pain that is not related to your normal pain.  Depending on the type of procedure that was done, some parts of your body may feel week and/or numb.  This usually clears up by tonight or the next day.  Walk with the use of an assistive device or accompanied by an adult for the 24 hours.  You may use ice on the affected area for the first 24 hours.  Put ice in a Ziploc bag and cover with a towel and place against area 15 minutes on 15 minutes off.  You may switch to heat after 24 hours. Preparation for the injection:  1. Do not eat any solid food or dairy products within 6 hours of your appointment.  2. You may drink clear liquids up to 2 hours before appointment.  Clear liquids include water, black coffee, juice or soda.  No milk or cream please. 3. You may take your regular medication, including pain medications, with a sip of water before your appointment  Diabetics should hold regular insulin (if taken separately) and take 1/2 normal NPH dos the morning of the procedure.  Carry some sugar containing items with you to your appointment. 4. A driver must accompany you and be prepared to drive you home after your procedure.  5. Bring all your current medications with your. 6. An IV may be inserted and sedation may be given at the discretion of the physician.   7. A blood pressure cuff, EKG and other monitors will often be applied during the procedure.  Some patients may need to have extra oxygen administered for a short period. 8. You will be asked to provide medical information, including your allergies, prior to the procedure.  We must know immediately if you are taking blood thinners (like Coumadin/Warfarin)  Or if you are allergic to IV iodine contrast  (dye). We must know if you could possible be pregnant.  Possible side-effects:  Bleeding from needle site  Infection (rare, may require surgery)  Nerve injury (rare)  Numbness & tingling (temporary)  Difficulty urinating (rare, temporary)  Spinal headache (  a headache worse with upright posture)  Light -headedness (temporary)  Pain at injection site (several days)  Decreased blood pressure (temporary)  Weakness in arm/leg (temporary)  Pressure sensation in back/neck (temporary)  Call if you experience:  Fever/chills associated with headache or increased back/neck pain.  Headache worsened by an upright position.  New onset weakness or numbness of an extremity below the injection site  Hives or difficulty breathing (go to the emergency room)  Inflammation or drainage at the infection site  Severe back/neck pain  Any new symptoms which are concerning to you  Please note:  Although the local anesthetic injected can often make your back or neck feel good for several hours after the injection, the pain will likely return.  It takes 3-7 days for steroids to work in the epidural space.  You may not notice any pain relief for at least that one week.  If effective, we will often do a series of three injections spaced 3-6 weeks apart to maximally decrease your pain.  After the initial series, we generally will wait several months before considering a repeat injection of the same type.  If you have any questions, please call [phone] [facility]

## 2015-07-28 DIAGNOSIS — L578 Other skin changes due to chronic exposure to nonionizing radiation: Secondary | ICD-10-CM | POA: Diagnosis not present

## 2015-07-28 DIAGNOSIS — L570 Actinic keratosis: Secondary | ICD-10-CM | POA: Diagnosis not present

## 2015-07-28 DIAGNOSIS — L853 Xerosis cutis: Secondary | ICD-10-CM | POA: Diagnosis not present

## 2015-07-28 DIAGNOSIS — Z1283 Encounter for screening for malignant neoplasm of skin: Secondary | ICD-10-CM | POA: Diagnosis not present

## 2015-08-05 ENCOUNTER — Encounter: Payer: Self-pay | Admitting: Anesthesiology

## 2015-08-05 ENCOUNTER — Ambulatory Visit: Payer: Medicare Other | Attending: Anesthesiology | Admitting: Anesthesiology

## 2015-08-05 VITALS — BP 188/77 | HR 60 | Temp 98.1°F | Resp 12 | Ht 61.0 in | Wt 140.0 lb

## 2015-08-05 DIAGNOSIS — M5136 Other intervertebral disc degeneration, lumbar region: Secondary | ICD-10-CM

## 2015-08-05 DIAGNOSIS — M4806 Spinal stenosis, lumbar region: Secondary | ICD-10-CM | POA: Insufficient documentation

## 2015-08-05 DIAGNOSIS — M1288 Other specific arthropathies, not elsewhere classified, other specified site: Secondary | ICD-10-CM | POA: Insufficient documentation

## 2015-08-05 DIAGNOSIS — M47817 Spondylosis without myelopathy or radiculopathy, lumbosacral region: Secondary | ICD-10-CM

## 2015-08-05 DIAGNOSIS — M545 Low back pain: Secondary | ICD-10-CM | POA: Diagnosis not present

## 2015-08-05 MED ORDER — ROPIVACAINE HCL 2 MG/ML IJ SOLN
INTRAMUSCULAR | Status: AC
  Administered 2015-08-05: 16:00:00
  Filled 2015-08-05: qty 10

## 2015-08-05 MED ORDER — LIDOCAINE HCL (PF) 1 % IJ SOLN
INTRAMUSCULAR | Status: AC
  Administered 2015-08-05: 16:00:00
  Filled 2015-08-05: qty 5

## 2015-08-05 MED ORDER — IOHEXOL 180 MG/ML  SOLN
INTRAMUSCULAR | Status: AC
  Filled 2015-08-05: qty 20

## 2015-08-05 MED ORDER — SODIUM CHLORIDE 0.9 % IJ SOLN
INTRAMUSCULAR | Status: AC
  Administered 2015-08-05: 16:00:00
  Filled 2015-08-05: qty 10

## 2015-08-05 MED ORDER — TRIAMCINOLONE ACETONIDE 40 MG/ML IJ SUSP
INTRAMUSCULAR | Status: AC
  Administered 2015-08-05: 16:00:00
  Filled 2015-08-05: qty 1

## 2015-08-05 MED ORDER — SODIUM CHLORIDE 0.9 % IJ SOLN
INTRAMUSCULAR | Status: AC
  Filled 2015-08-05: qty 10

## 2015-08-05 MED ORDER — TRIAMCINOLONE ACETONIDE 40 MG/ML IJ SUSP
INTRAMUSCULAR | Status: AC
Start: 2015-08-05 — End: 2015-08-06
  Filled 2015-08-05: qty 1

## 2015-08-05 NOTE — Patient Instructions (Signed)
Epidural Steroid Injection Patient Information  Description: The epidural space surrounds the nerves as they exit the spinal cord.  In some patients, the nerves can be compressed and inflamed by a bulging disc or a tight spinal canal (spinal stenosis).  By injecting steroids into the epidural space, we can bring irritated nerves into direct contact with a potentially helpful medication.  These steroids act directly on the irritated nerves and can reduce swelling and inflammation which often leads to decreased pain.  Epidural steroids may be injected anywhere along the spine and from the neck to the low back depending upon the location of your pain.   After numbing the skin with local anesthetic (like Novocaine), a small needle is passed into the epidural space slowly.  You may experience a sensation of pressure while this is being done.  The entire block usually last less than 10 minutes.  Conditions which may be treated by epidural steroids:   Low back and leg pain  Neck and arm pain  Spinal stenosis  Post-laminectomy syndrome  Herpes zoster (shingles) pain  Pain from compression fractures  Preparation for the injection:  1. Do not eat any solid food or dairy products within 6 hours of your appointment.  2. You may drink clear liquids up to 2 hours before appointment.  Clear liquids include water, black coffee, juice or soda.  No milk or cream please. 3. You may take your regular medication, including pain medications, with a sip of water before your appointment  Diabetics should hold regular insulin (if taken separately) and take 1/2 normal NPH dos the morning of the procedure.  Carry some sugar containing items with you to your appointment. 4. A driver must accompany you and be prepared to drive you home after your procedure.  5. Bring all your current medications with your. 6. An IV may be inserted and sedation may be given at the discretion of the physician.   7. A blood pressure  cuff, EKG and other monitors will often be applied during the procedure.  Some patients may need to have extra oxygen administered for a short period. 8. You will be asked to provide medical information, including your allergies, prior to the procedure.  We must know immediately if you are taking blood thinners (like Coumadin/Warfarin)  Or if you are allergic to IV iodine contrast (dye). We must know if you could possible be pregnant.  Possible side-effects:  Bleeding from needle site  Infection (rare, may require surgery)  Nerve injury (rare)  Numbness & tingling (temporary)  Difficulty urinating (rare, temporary)  Spinal headache ( a headache worse with upright posture)  Light -headedness (temporary)  Pain at injection site (several days)  Decreased blood pressure (temporary)  Weakness in arm/leg (temporary)  Pressure sensation in back/neck (temporary)  Call if you experience:  Fever/chills associated with headache or increased back/neck pain.  Headache worsened by an upright position.  New onset weakness or numbness of an extremity below the injection site  Hives or difficulty breathing (go to the emergency room)  Inflammation or drainage at the infection site  Severe back/neck pain  Any new symptoms which are concerning to you  Please note:  Although the local anesthetic injected can often make your back or neck feel good for several hours after the injection, the pain will likely return.  It takes 3-7 days for steroids to work in the epidural space.  You may not notice any pain relief for at least that one week.  If effective, we will often do a series of three injections spaced 3-6 weeks apart to maximally decrease your pain.  After the initial series, we generally will wait several months before considering a repeat injection of the same type.  If you have any questions, please call [phone] [facility]  Discharge Instructions  General Discharge Instructions :  If you need to reach your doctor call: Monday-Friday 8:00 am - 4:00 pm at [phone] or toll free [phone].  After clinic hours [phone] to have operator reach doctor.  Bring all of your medication bottles to all your appointments in the pain clinic.  To cancel or reschedule your appointment with Pain Management please remember to call 24 hours in advance to avoid a fee.  Refer to the educational materials which you have been given on: General Risks, I had my Procedure. Discharge Instructions, Post Sedation.  Post Procedure Instructions:  The drugs you were given will stay in your system until tomorrow, so for the next 24 hours you should not drive, make any legal decisions or drink any alcoholic beverages.  You may eat anything you prefer, but it is better to start with liquids then soups and crackers, and gradually work up to solid foods.  Please notify your doctor immediately if you have any unusual bleeding, trouble breathing or pain that is not related to your normal pain.  Depending on the type of procedure that was done, some parts of your body may feel week and/or numb.  This usually clears up by tonight or the next day.  Walk with the use of an assistive device or accompanied by an adult for the 24 hours.  You may use ice on the affected area for the first 24 hours.  Put ice in a Ziploc bag and cover with a towel and place against area 15 minutes on 15 minutes off.  You may switch to heat after 24 hours. 

## 2015-08-06 ENCOUNTER — Telehealth: Payer: Self-pay

## 2015-08-06 NOTE — Telephone Encounter (Signed)
"  I am hanging in there.  Im not doing as good as I usually do, but Im doing OK"  Instructed to put heat on it today and to call u s if needed.

## 2015-08-07 NOTE — Progress Notes (Signed)
PROCEDURE PERFORMED: Lumbar epidural steroid injection under fluroscopic guidance with moderate sedation.at L2-L3  CC:  Bilateral lower back pain   HPI:  Alice Delgado was last seen about one month ago. She continues to have pain of the same quality and characteristic mainly in the mid thoracic and high lumbar region. Her lower extremity pain that was radiating down her legs has resolved. Otherwise she is in her usual state of health at many changes in lower extremity strength or function or problems with bowel or bladder dysfunction.  Physical Exam:    PERRL, EOMI  Heart RRR   LCTA  Musculoskeletal: She has pain with extension at the low back and walks with an antalgic gait. Her strength appears to be at baseline with no changes in sensory or motor function. Spectral of her low back reveals some paraspinous muscle tenderness but no overt trigger points.  Assessment:  1.  Spinal stenosis with intractable low back pain  2.  Denerative disc disease and facet arthropathy and persistent pain despite conservative therapy.  PLAN:   1.  As requested by the patient is reasonable to proceed with a repeat epidural steroids injection today.she got excellent relief to the lower extremity pain however continues to have the mid thoracic and high lumbar pain. We'll plan on doing a higher lumbar epidural steroid above the well-healed scar in her back to see if we can gather some relief of the higher lumbar pain. We've gone over the risks benefits of the procedure in full detail with all questions answered. Return to clinic approximately 1 month for reevaluation and possible repeat injection at that time  Procedure:  LESI:  NOTE: The risks, benefits, and expectations of the procedure have been discussed and explained to the patient who was understanding and in agreement with suggested treatment plan. No guarantees were made.  DESCRIPTION OF PROCEDURE: Lumbar epidural steroid injection with IV Versed, EKG, blood  pressure, pulse, and pulse oximetry monitoring. The procedure was performed with the patient in the prone position under fluoroscopic guidance. A local anesthetic skin wheal of 1.5% plain lidocaine was performed at the appropriate site after fluoroscopic identifictation which was at the L L1-L2 interspace. Using strict aseptic technique, I then advanced an 18-gauge Tuohy epidural needle in the midline via loss-of-resistance  Technique. There was negative aspiration for negative aspiration for heme or  CSF.  I then confirmed position with both AP and Lateral fluoroscan. I injected 2 cc of Omnipaque yielding good epidural spread. A total of 5 mL of Preservative-Free normal saline with 40 mg of Kenalog and 1cc Ropicaine 0.2 percent was injected incrementally via the  epidurally placed needle. Needle removed. The patient tolerated the injection well.   '@Tashonna Descoteaux'$  Andree Elk, MD@

## 2015-09-01 ENCOUNTER — Ambulatory Visit: Payer: Medicare Other | Admitting: Anesthesiology

## 2015-09-02 DIAGNOSIS — M4807 Spinal stenosis, lumbosacral region: Secondary | ICD-10-CM | POA: Diagnosis not present

## 2015-09-02 DIAGNOSIS — D509 Iron deficiency anemia, unspecified: Secondary | ICD-10-CM | POA: Diagnosis not present

## 2015-09-02 DIAGNOSIS — R0602 Shortness of breath: Secondary | ICD-10-CM | POA: Diagnosis not present

## 2015-09-02 DIAGNOSIS — N182 Chronic kidney disease, stage 2 (mild): Secondary | ICD-10-CM | POA: Diagnosis not present

## 2015-09-02 DIAGNOSIS — I10 Essential (primary) hypertension: Secondary | ICD-10-CM | POA: Diagnosis not present

## 2015-09-15 ENCOUNTER — Ambulatory Visit: Payer: Medicare Other | Admitting: Anesthesiology

## 2015-09-16 ENCOUNTER — Ambulatory Visit: Payer: Medicare Other | Attending: Anesthesiology | Admitting: Anesthesiology

## 2015-09-16 ENCOUNTER — Encounter: Payer: Self-pay | Admitting: Anesthesiology

## 2015-09-16 VITALS — BP 186/80 | HR 60 | Temp 98.2°F | Resp 16 | Ht 61.0 in | Wt 140.0 lb

## 2015-09-16 DIAGNOSIS — M545 Low back pain: Secondary | ICD-10-CM | POA: Insufficient documentation

## 2015-09-16 DIAGNOSIS — M1288 Other specific arthropathies, not elsewhere classified, other specified site: Secondary | ICD-10-CM | POA: Diagnosis not present

## 2015-09-16 DIAGNOSIS — M5136 Other intervertebral disc degeneration, lumbar region: Secondary | ICD-10-CM | POA: Diagnosis not present

## 2015-09-16 DIAGNOSIS — M5126 Other intervertebral disc displacement, lumbar region: Secondary | ICD-10-CM | POA: Insufficient documentation

## 2015-09-16 DIAGNOSIS — M4806 Spinal stenosis, lumbar region: Secondary | ICD-10-CM | POA: Diagnosis not present

## 2015-09-16 DIAGNOSIS — M47817 Spondylosis without myelopathy or radiculopathy, lumbosacral region: Secondary | ICD-10-CM

## 2015-09-16 MED ORDER — ROPIVACAINE HCL 2 MG/ML IJ SOLN
INTRAMUSCULAR | Status: AC
Start: 2015-09-16 — End: 2015-09-16
  Administered 2015-09-16: 13:00:00
  Filled 2015-09-16: qty 10

## 2015-09-16 MED ORDER — TRIAMCINOLONE ACETONIDE 40 MG/ML IJ SUSP
INTRAMUSCULAR | Status: AC
  Administered 2015-09-16: 13:00:00
  Filled 2015-09-16: qty 1

## 2015-09-16 MED ORDER — MIDAZOLAM HCL 5 MG/5ML IJ SOLN
INTRAMUSCULAR | Status: AC
  Administered 2015-09-16: 13:00:00
  Filled 2015-09-16: qty 5

## 2015-09-16 NOTE — Progress Notes (Signed)
Safety precautions to be maintained throughout the outpatient stay will include: orient to surroundings, keep bed in low position, maintain call bell within reach at all times, provide assistance with transfer out of bed and ambulation.  

## 2015-09-16 NOTE — Patient Instructions (Signed)
Facet Joint Block The facet joints connect the bones of the spine (vertebrae). They make it possible for you to bend, twist, and make other movements with your spine. They also prevent you from overbending, overtwisting, and making other excessive movements.  A facet joint block is a procedure where a numbing medicine (anesthetic) is injected into a facet joint. Often, a type of anti-inflammatory medicine called a steroid is also injected. A facet joint block may be done for two reasons:   Diagnosis. A facet joint block may be done as a test to see whether neck or back pain is caused by a worn-down or infected facet joint. If the pain gets better after a facet joint block, it means the pain is probably coming from the facet joint. If the pain does not get better, it means the pain is probably not coming from the facet joint.   Therapy. A facet joint block may be done to relieve neck or back pain caused by a facet joint. A facet joint block is only done as a therapy if the pain does not improve with medicine, exercise programs, physical therapy, and other forms of pain management. LET YOUR HEALTH CARE PROVIDER KNOW ABOUT:   Any allergies you have.   All medicines you are taking, including vitamins, herbs, eyedrops, and over-the-counter medicines and creams.   Previous problems you or members of your family have had with the use of anesthetics.   Any blood disorders you have had.   Other health problems you have. RISKS AND COMPLICATIONS Generally, having a facet joint block is safe. However, as with any procedure, complications can occur. Possible complications associated with having a facet joint block include:   Bleeding.   Injury to a nerve near the injection site.   Pain at the injection site.   Weakness or numbness in areas controlled by nerves near the injection site.   Infection.   Temporary fluid retention.   Allergic reaction to anesthetics or medicines used during  the procedure. BEFORE THE PROCEDURE   Follow your health care provider's instructions if you are taking dietary supplements or medicines. You may need to stop taking them or reduce your dosage.   Do not take any new dietary supplements or medicines without asking your health care provider first.   Follow your health care provider's instructions about eating and drinking before the procedure. You may need to stop eating and drinking several hours before the procedure.   Arrange to have an adult drive you home after the procedure. PROCEDURE  You may need to remove your clothing and dress in an open-back gown so that your health care provider can access your spine.   The procedure will be done while you are lying on an X-ray table. Most of the time you will be asked to lie on your stomach, but you may be asked to lie in a different position if an injection will be made in your neck.   Special machines will be used to monitor your oxygen levels, heart rate, and blood pressure.   If an injection will be made in your neck, an intravenous (IV) tube will be inserted into one of your veins. Fluids and medicine will flow directly into your body through the IV tube.   The area over the facet joint where the injection will be made will be cleaned with an antiseptic soap. The surrounding skin will be covered with sterile drapes.   An anesthetic will be applied to your skin   to make the injection area numb. You may feel a temporary stinging or burning sensation.   A video X-ray machine will be used to locate the joint. A contrast dye may be injected into the facet joint area to help with locating the joint.   When the joint is located, an anesthetic medicine will be injected into the joint through the needle.   Your health care provider will ask you whether you feel pain relief. If you do feel relief, a steroid may be injected to provide pain relief for a longer period of time. If you do not  feel relief or feel only partial relief, additional injections of an anesthetic may be made in other facet joints.   The needle will be removed, the skin will be cleansed, and bandages will be applied.  AFTER THE PROCEDURE   You will be observed for 15-30 minutes before being allowed to go home. Do not drive. Have an adult drive you or take a taxi or public transportation instead.   If you feel pain relief, the pain will return in several hours or days when the anesthetic wears off.   You may feel pain relief 2-14 days after the procedure. The amount of time this relief lasts varies from person to person.   It is normal to feel some tenderness over the injected area(s) for 2 days following the procedure.   If you have diabetes, you may have a temporary increase in blood sugar.   This information is not intended to replace advice given to you by your health care provider. Make sure you discuss any questions you have with your health care provider.   Document Released: 02/23/2007 Document Revised: 10/25/2014 Document Reviewed: 07/24/2012 [employer] Interactive Patient Education 2016 [employer] Pain Management Discharge Instructions  General Discharge Instructions :  If you need to reach your doctor call: Monday-Friday 8:00 am - 4:00 pm at [phone] or toll free [phone].  After clinic hours [phone] to have operator reach doctor.  Bring all of your medication bottles to all your appointments in the pain clinic.  To cancel or reschedule your appointment with Pain Management please remember to call 24 hours in advance to avoid a fee.  Refer to the educational materials which you have been given on: General Risks, I had my Procedure. Discharge Instructions, Post Sedation.  Post Procedure Instructions:  The drugs you were given will stay in your system until tomorrow, so for the next 24 hours you should not drive, make any legal decisions or drink any alcoholic  beverages.  You may eat anything you prefer, but it is better to start with liquids then soups and crackers, and gradually work up to solid foods.  Please notify your doctor immediately if you have any unusual bleeding, trouble breathing or pain that is not related to your normal pain.  Depending on the type of procedure that was done, some parts of your body may feel week and/or numb.  This usually clears up by tonight or the next day.  Walk with the use of an assistive device or accompanied by an adult for the 24 hours.  You may use ice on the affected area for the first 24 hours.  Put ice in a Ziploc bag and cover with a towel and place against area 15 minutes on 15 minutes off.  You may switch to heat after 24 hours. 

## 2015-09-17 ENCOUNTER — Telehealth: Payer: Self-pay | Admitting: *Deleted

## 2015-09-17 NOTE — Telephone Encounter (Signed)
No problems post procedure. 

## 2015-09-17 NOTE — Progress Notes (Signed)
PROCEDURE PERFORMED: Left side facet block under fluoroscopic guidance with moderate sedation at L3-4 or L4-5 L5-S1 and S1 CC:  Bilateral lower back pain   HPI:  Alice Delgado presents for reevaluation. She was last seen in October and is status post 2 epidural injections. She had some transient relief but not enough sustained relief. The quality characteristic and distribution of the pain are otherwise unchanged it's primarily in the left lower back with radiation into the left posterior and lateral leg. Range in bowel or bladder function is noted at this time. Her strength appears to be at baseline. The quality characteristic and distribution of pain been stable in nature   Physical Exam:    PERRL, EOMI  Heart RRR   LCTA  Musculoskeletal: She has pain with extension at the low back and walks with an antalgic gait. Her strength appears to be at baseline with no changes in sensory or motor function. Spectral of her low back reveals some paraspinous muscle tenderness but no overt trigger points. The pain with extension in the standing position is with left lateral rotation and reproduced with extension  Assessment:  1. Facet arthropathy   2.  Spinal stenosis with intractable low back pain  3, Degenerative disc disease and facet arthropathy and persistent pain despite conservative therapy.  PLAN:   We'll plan on proceeding with a lumbar facet block today as described to the patient. All her questions have been answered and the risks and benefits reviewed in full detail. We'll then have her return to clinic in 1 month for reevaluation possible repeat injection if indicated depending on her response to today's her feet.   Patient was taken to the fluoroscopy suite and placed in prone position.. Vital signs are stable throughout the procedure. The area overlying her left back was prepped with Betadine 3 and strict aseptic technique was utilized throughout the procedure. Identified the areas overlying  the after mentioned facets at  L3-4 or L4-5 L5-S1. 1% lidocaine 1 cc was infiltrated subcutaneous and into the fascia with a 25-gauge needle at each of these sites. I then advanced 22-gauge 3-1/2 inch quickie needles with the needle tip to lie at the Ide[place] I portion of the [employer] dog. There is negative aspiration for heme or CSF no paresthesia and I then injected 2 cc of ropivacaine 0.2% mixed with 8 mg of triamcinolone at each of the aforementioned sites. These needles were withdrawn and the L5-S1 needle was redirected towards the right S1 posterior foramen. This was once again advanced without paresthesia negative aspiration and 2 cc of this same mixture was injected at that site. Patient was convalesced discharged home stable condition for follow-up as mentioned.'@Alice Delgado'$  Andree Elk, MD@

## 2015-09-24 DIAGNOSIS — D509 Iron deficiency anemia, unspecified: Secondary | ICD-10-CM | POA: Diagnosis not present

## 2015-09-24 DIAGNOSIS — E039 Hypothyroidism, unspecified: Secondary | ICD-10-CM | POA: Diagnosis not present

## 2015-09-24 DIAGNOSIS — N182 Chronic kidney disease, stage 2 (mild): Secondary | ICD-10-CM | POA: Diagnosis not present

## 2015-11-03 ENCOUNTER — Other Ambulatory Visit: Payer: Self-pay | Admitting: Cardiovascular Disease

## 2015-11-04 ENCOUNTER — Ambulatory Visit: Payer: Medicare Other | Admitting: Cardiovascular Disease

## 2015-11-05 ENCOUNTER — Ambulatory Visit: Payer: Medicare Other | Admitting: Anesthesiology

## 2015-11-12 DIAGNOSIS — E039 Hypothyroidism, unspecified: Secondary | ICD-10-CM | POA: Diagnosis not present

## 2015-11-29 ENCOUNTER — Other Ambulatory Visit: Payer: Self-pay | Admitting: Cardiovascular Disease

## 2016-01-05 ENCOUNTER — Encounter: Payer: Self-pay | Admitting: Cardiovascular Disease

## 2016-01-05 ENCOUNTER — Ambulatory Visit (INDEPENDENT_AMBULATORY_CARE_PROVIDER_SITE_OTHER): Payer: Medicare Other | Admitting: Cardiovascular Disease

## 2016-01-05 VITALS — BP 130/60 | HR 54 | Ht 61.0 in | Wt 137.5 lb

## 2016-01-05 DIAGNOSIS — I10 Essential (primary) hypertension: Secondary | ICD-10-CM | POA: Diagnosis not present

## 2016-01-05 DIAGNOSIS — R002 Palpitations: Secondary | ICD-10-CM

## 2016-01-05 DIAGNOSIS — R0602 Shortness of breath: Secondary | ICD-10-CM

## 2016-01-05 NOTE — Progress Notes (Signed)
Cardiology Office Note   Date:  01/05/2016   ID:  Alice Delgado, Alice Delgado December 19, 1928, MRN [id number]  PCP:  Lavera Guise, MD  Cardiologist:   Kathlyn Sacramento, MD   Chief Complaint  Patient presents with  . other    6 month follow up. Meds reviewed by the patient verbally. "doing well."       History of Present Illness: Alice Delgado is a 82 y.o. female who presents for a follow-up visit regarding refractory hypertension and palpitations. She has chronic medical conditions that include hypertension, hyperlipidemia, chronic back pain, hypothyroidism and asthma.  she reports prolonged history of hypertension which has been difficult to control recently mostly due to intolerance to multiple medications.  ACE inhibitors/ARB caused cough. Hydrochlorothiazide caused hyponatremia and hypokalemia. Hydralazine worsened lower extremity edema.  Renal artery duplex  showed no evidence of renal artery stenosis.  A Holter monitor was done for palpitations which showed occasional PACs and PVCs with very short runs of SVT. She has been doing reasonably well on current medications and blood pressure has been more controlled than before. She was diagnosed with mild hypothyroidism and was started on small dose thyroid replacement. Since then, she reports worsening exertional dyspnea with no orthopnea, PND or leg edema. She denies any chest discomfort.    Past Medical History  Diagnosis Date  . Hypertension   . GERD (gastroesophageal reflux disease)   . Hypercholesterolemia   . Hyperglycemia   . Vitamin D deficiency   . Hypothyroidism   . Peptic ulcer disease   . Diverticulosis   . Pernicious anemia   . Osteoarthritis     lumbar disc dz, cervical disc dz, hands  . Osteoporosis     intolerance to fosamax, evista and miacalcin  . Asthma   . Hiatal hernia     Past Surgical History  Procedure Laterality Date  . Abdominal hysterectomy      with benign ovarian tumor removed.    . Lumbar  fusion       Current Outpatient Prescriptions  Medication Sig Dispense Refill  . albuterol (PROAIR HFA) 108 (90 BASE) MCG/ACT inhaler Inhale 1 puff into the lungs every 6 (six) hours as needed for wheezing or shortness of breath.    Marland Kitchen amLODipine (NORVASC) 5 MG tablet Take 5 mg by mouth daily.    . Azelastine HCl 0.15 % SOLN Place into the nose as needed.    . cyanocobalamin (,VITAMIN B-12,) 1000 MCG/ML injection Inject 1 mL (1,000 mcg total) into the muscle every 30 (thirty) days. 10 mL 1  . esomeprazole (NEXIUM) 40 MG capsule Take 40 mg by mouth daily at 12 noon.    . Fluticasone Furoate-Vilanterol (BREO ELLIPTA IN) Inhale into the lungs daily.    . hydrALAZINE (APRESOLINE) 50 MG tablet Take 1 tablet (50 mg total) by mouth 3 (three) times daily. (Patient taking differently: Take 25 mg by mouth 3 (three) times daily. ) 90 tablet 5  . HYDROcodone-acetaminophen (VICODIN) 5-325 mg TABS tablet One tablet q day to bid prn 40 tablet 0  . ipratropium-albuterol (DUONEB) 0.5-2.5 (3) MG/3ML SOLN Take 3 mLs by nebulization as needed.    Marland Kitchen levothyroxine (SYNTHROID, LEVOTHROID) 25 MCG tablet 25 mcg daily before breakfast.   2  . montelukast (SINGULAIR) 10 MG tablet Take 10 mg by mouth at bedtime.    . nebivolol (BYSTOLIC) 5 MG tablet Take 5 mg by mouth daily.    Marland Kitchen spironolactone (ALDACTONE) 25 MG tablet Take 25  mg by mouth daily.     No current facility-administered medications for this visit.    Allergies:   Augmentin; Evista; Fosamax; Lipitor; Miralax; Penicillins; and Zetia    Social History:  The patient  reports that she has quit smoking. Her smoking use included Cigarettes. She quit after 15 years of use. She has never used smokeless tobacco. She reports that she does not drink alcohol or use illicit drugs.   Family History:  The patient's family history includes Breast cancer in her sister; Cancer in her mother; Heart attack in her father; Heart disease in her father. There is no history of  Colon cancer.    ROS:  Please see the history of present illness.   Otherwise, review of systems are positive for none.   All other systems are reviewed and negative.    PHYSICAL EXAM: VS:  BP 130/60 mmHg  Pulse 54  Ht '5\' 1"'$  (1.549 m)  Wt 137 lb 8 oz (62.37 kg)  BMI 25.99 kg/m2 , BMI Body mass index is 25.99 kg/(m^2). GEN: Well nourished, well developed, in no acute distress HEENT: normal Neck: no JVD, carotid bruits, or masses Cardiac: RRR; no murmurs, rubs, or gallops,no edema  Respiratory:  clear to auscultation bilaterally, normal work of breathing GI: soft, nontender, nondistended, + BS MS: no deformity or atrophy Skin: warm and dry, no rash Neuro:  Strength and sensation are intact Psych: euthymic mood, full affect   EKG:  EKG is ordered today. The ekg ordered today demonstrates sinus bradycardia with first-degree AV block. No significant ST or T wave changes.   Recent Labs: No results found for requested labs within last 365 days.    Lipid Panel    Component Value Date/Time   CHOL 202* 11/19/2013 1108   TRIG 199.0* 11/19/2013 1108   HDL 44.40 11/19/2013 1108   CHOLHDL 5 11/19/2013 1108   VLDL 39.8 11/19/2013 1108   LDLCALC 122* 05/29/2013 0811   LDLDIRECT 131.1 11/19/2013 1108      Wt Readings from Last 3 Encounters:  01/05/16 137 lb 8 oz (62.37 kg)  09/16/15 140 lb (63.504 kg)  08/05/15 140 lb (63.504 kg)      Other studies Reviewed: Additional studies/ records that were reviewed today include: Office note from primary care physician. Labs from December 2016. Review of the above records demonstrates: Mild anemia with a hemoglobin of 11.7. Creatinine was 1.31. Potassium was 4.9.   ASSESSMENT AND PLAN:  1.  Essential hypertension: Difficult to control hypertension with no evidence of renal artery stenosis or secondary hypertension. Blood pressure has been reasonably controlled on current medications including hydralazine 25 mg 3 times daily,  spironolactone 25 mg daily, amlodipine and Bystolic.   2. Sinus bradycardia: She also has first degree AV block but overall she appears to be asymptomatic. We should not increase the dose of beta blocker. She reports worsening dyspnea since she was started on thyroid replacement therapy. Thus, I requested an echocardiogram.  3. Palpitations: Previous Holter monitor showed PACs and very short runs of SVT. Symptoms are controlled with Bystolic.      Disposition:   FU with me in 6 months  Signed,  Kathlyn Sacramento, MD  01/05/2016 2:12 PM    [facility]

## 2016-01-05 NOTE — Patient Instructions (Addendum)
Medication Instructions:  Your physician recommends that you continue on your current medications as directed. Please refer to the Current Medication list given to you today.  Labwork: none  Testing/Procedures: Your physician has requested that you have an echocardiogram. Echocardiography is a painless test that uses sound waves to create images of your heart. It provides your doctor with information about the size and shape of your heart and how well your heart's chambers and valves are working. This procedure takes approximately one hour. There are no restrictions for this procedure.    Follow-Up: Your physician wants you to follow-up in: six months with Dr. Arida.  You will receive a reminder letter in the mail two months in advance. If you don't receive a letter, please call our office to schedule the follow-up appointment.   Any Other Special Instructions Will Be Listed Below (If Applicable).     If you need a refill on your cardiac medications before your next appointment, please call your pharmacy.  Echocardiogram An echocardiogram, or echocardiography, uses sound waves (ultrasound) to produce an image of your heart. The echocardiogram is simple, painless, obtained within a short period of time, and offers valuable information to your health care provider. The images from an echocardiogram can provide information such as:  Evidence of coronary artery disease (CAD).  Heart size.  Heart muscle function.  Heart valve function.  Aneurysm detection.  Evidence of a past heart attack.  Fluid buildup around the heart.  Heart muscle thickening.  Assess heart valve function. LET YOUR HEALTH CARE PROVIDER KNOW ABOUT:  Any allergies you have.  All medicines you are taking, including vitamins, herbs, eye drops, creams, and over-the-counter medicines.  Previous problems you or members of your family have had with the use of anesthetics.  Any blood disorders you  have.  Previous surgeries you have had.  Medical conditions you have.  Possibility of pregnancy, if this applies. BEFORE THE PROCEDURE  No special preparation is needed. Eat and drink normally.  PROCEDURE   In order to produce an image of your heart, gel will be applied to your chest and a wand-like tool (transducer) will be moved over your chest. The gel will help transmit the sound waves from the transducer. The sound waves will harmlessly bounce off your heart to allow the heart images to be captured in real-time motion. These images will then be recorded.  You may need an IV to receive a medicine that improves the quality of the pictures. AFTER THE PROCEDURE You may return to your normal schedule including diet, activities, and medicines, unless your health care provider tells you otherwise.   This information is not intended to replace advice given to you by your health care provider. Make sure you discuss any questions you have with your health care provider.   Document Released: 10/01/2000 Document Revised: 10/25/2014 Document Reviewed: 06/11/2013 Elsevier Interactive Patient Education 2016 [employer]  

## 2016-01-13 DIAGNOSIS — N182 Chronic kidney disease, stage 2 (mild): Secondary | ICD-10-CM | POA: Diagnosis not present

## 2016-01-13 DIAGNOSIS — D509 Iron deficiency anemia, unspecified: Secondary | ICD-10-CM | POA: Diagnosis not present

## 2016-01-13 DIAGNOSIS — D510 Vitamin B12 deficiency anemia due to intrinsic factor deficiency: Secondary | ICD-10-CM | POA: Diagnosis not present

## 2016-01-13 DIAGNOSIS — I10 Essential (primary) hypertension: Secondary | ICD-10-CM | POA: Diagnosis not present

## 2016-01-13 DIAGNOSIS — R0602 Shortness of breath: Secondary | ICD-10-CM | POA: Diagnosis not present

## 2016-01-22 ENCOUNTER — Other Ambulatory Visit: Payer: Medicare Other

## 2016-02-05 ENCOUNTER — Other Ambulatory Visit: Payer: Self-pay

## 2016-02-05 ENCOUNTER — Ambulatory Visit (INDEPENDENT_AMBULATORY_CARE_PROVIDER_SITE_OTHER): Payer: Medicare Other

## 2016-02-05 DIAGNOSIS — R0602 Shortness of breath: Secondary | ICD-10-CM

## 2016-03-02 DIAGNOSIS — R0602 Shortness of breath: Secondary | ICD-10-CM | POA: Diagnosis not present

## 2016-03-02 DIAGNOSIS — I279 Pulmonary heart disease, unspecified: Secondary | ICD-10-CM | POA: Diagnosis not present

## 2016-03-05 ENCOUNTER — Encounter: Payer: Self-pay | Admitting: Cardiovascular Disease

## 2016-03-05 ENCOUNTER — Ambulatory Visit (INDEPENDENT_AMBULATORY_CARE_PROVIDER_SITE_OTHER): Payer: Medicare Other | Admitting: Cardiovascular Disease

## 2016-03-05 ENCOUNTER — Encounter (INDEPENDENT_AMBULATORY_CARE_PROVIDER_SITE_OTHER): Payer: Self-pay

## 2016-03-05 VITALS — BP 138/52 | HR 55 | Ht 61.0 in | Wt 137.2 lb

## 2016-03-05 DIAGNOSIS — I272 Other secondary pulmonary hypertension: Secondary | ICD-10-CM | POA: Diagnosis not present

## 2016-03-05 DIAGNOSIS — I10 Essential (primary) hypertension: Secondary | ICD-10-CM

## 2016-03-05 NOTE — Patient Instructions (Signed)
Medication Instructions: Continue same medications.   Labwork: None.   Procedures/Testing: None.   Follow-Up: 2 months with Dr. Fletcher Anon.   Any Additional Special Instructions Will Be Listed Below (If Applicable).     If you need a refill on your cardiac medications before your next appointment, please call your pharmacy.

## 2016-03-05 NOTE — Progress Notes (Signed)
Cardiology Office Note   Date:  03/05/2016   ID:  Alice Delgado, DOB 10-25-1928, MRN [id number]  PCP:  Alice Guise, MD  Cardiologist:   Alice Sacramento, MD   Chief Complaint  Patient presents with  . other    Pt. c/o shortness of breath and follow up from the Echo per Clayborn Bigness.  Meds reviewed by the patient verbally.       History of Present Illness: Alice Delgado is a 82 y.o. female who presents for a follow-up visit regarding refractory hypertension and recent finding of pulmonary hypertension on echocardiogram. She has chronic medical conditions that include hypertension, hyperlipidemia, chronic back pain, hypothyroidism and asthma.  she reports prolonged history of hypertension which has been difficult to control recently mostly due to intolerance to multiple medications.  ACE inhibitors/ARB caused cough. Hydrochlorothiazide caused hyponatremia and hypokalemia. Hydralazine worsened lower extremity edema.  Renal artery duplex  showed no evidence of renal artery stenosis.  A Holter monitor was done for palpitations which showed occasional PACs and PVCs with very short runs of SVT. She was seen recently for worsening exertional dyspnea. This is occasionally associated with substernal chest tightness. She has slowed down her physical activities gradually over the last year due to her symptoms. She underwent an echocardiogram which showed normal LV systolic function, grade 1 diastolic dysfunction, mild mitral regurgitation, moderate tricuspid regurgitation with moderate to severe pulmonary hypertension with an estimated systolic pulmonary pressure of 63 mmHg with moderate right ventricular hypertrophy. The patient had no prior history of pulmonary hypertension. She underwent a recent nighttime oximetry test. There is no documented history of sleep apnea.    Past Medical History  Diagnosis Date  . Hypertension   . GERD (gastroesophageal reflux disease)   .  Hypercholesterolemia   . Hyperglycemia   . Vitamin D deficiency   . Hypothyroidism   . Peptic ulcer disease   . Diverticulosis   . Pernicious anemia   . Osteoarthritis     lumbar disc dz, cervical disc dz, hands  . Osteoporosis     intolerance to fosamax, evista and miacalcin  . Asthma   . Hiatal hernia     Past Surgical History  Procedure Laterality Date  . Abdominal hysterectomy      with benign ovarian tumor removed.    . Lumbar fusion       Current Outpatient Prescriptions  Medication Sig Dispense Refill  . albuterol (PROAIR HFA) 108 (90 BASE) MCG/ACT inhaler Inhale 1 puff into the lungs every 6 (six) hours as needed for wheezing or shortness of breath.    Marland Kitchen amLODipine (NORVASC) 5 MG tablet Take 5 mg by mouth daily.    . Azelastine HCl 0.15 % SOLN Place into the nose as needed.    . cyanocobalamin (,VITAMIN B-12,) 1000 MCG/ML injection Inject 1 mL (1,000 mcg total) into the muscle every 30 (thirty) days. 10 mL 1  . esomeprazole (NEXIUM) 40 MG capsule Take 40 mg by mouth daily at 12 noon.    . Fluticasone Furoate-Vilanterol (BREO ELLIPTA IN) Inhale into the lungs daily.    . hydrALAZINE (APRESOLINE) 50 MG tablet Take 1 tablet (50 mg total) by mouth 3 (three) times daily. (Patient taking differently: Take 25 mg by mouth 3 (three) times daily. ) 90 tablet 5  . HYDROcodone-acetaminophen (VICODIN) 5-325 mg TABS tablet One tablet q day to bid prn 40 tablet 0  . ipratropium-albuterol (DUONEB) 0.5-2.5 (3) MG/3ML SOLN Take 3 mLs by nebulization  as needed.    . montelukast (SINGULAIR) 10 MG tablet Take 10 mg by mouth at bedtime.    . nebivolol (BYSTOLIC) 5 MG tablet Take 5 mg by mouth daily.    Marland Kitchen spironolactone (ALDACTONE) 25 MG tablet Take 25 mg by mouth daily.     No current facility-administered medications for this visit.    Allergies:   Augmentin; Evista; Fosamax; Lipitor; Miralax; Penicillins; and Zetia    Social History:  The patient  reports that she has quit smoking.  Her smoking use included Cigarettes. She quit after 15 years of use. She has never used smokeless tobacco. She reports that she does not drink alcohol or use illicit drugs.   Family History:  The patient's family history includes Breast cancer in her sister; Cancer in her mother; Heart attack in her father; Heart disease in her father. There is no history of Colon cancer.    ROS:  Please see the history of present illness.   Otherwise, review of systems are positive for none.   All other systems are reviewed and negative.    PHYSICAL EXAM: VS:  BP 138/52 mmHg  Pulse 55  Ht '5\' 1"'$  (1.549 m)  Wt 137 lb 4 oz (62.256 kg)  BMI 25.95 kg/m2 , BMI Body mass index is 25.95 kg/(m^2). GEN: Well nourished, well developed, in no acute distress HEENT: normal Neck: no JVD, carotid bruits, or masses Cardiac: RRR; no murmurs, rubs, or gallops,no edema  Respiratory:  clear to auscultation bilaterally, normal work of breathing GI: soft, nontender, nondistended, + BS MS: no deformity or atrophy Skin: warm and dry, no rash Neuro:  Strength and sensation are intact Psych: euthymic mood, full affect   EKG:  EKG is ordered today. The ekg ordered today demonstrates sinus bradycardia with first-degree AV block. No significant ST or T wave changes.   Recent Labs: No results found for requested labs within last 365 days.    Lipid Panel    Component Value Date/Time   CHOL 202* 11/19/2013 1108   TRIG 199.0* 11/19/2013 1108   HDL 44.40 11/19/2013 1108   CHOLHDL 5 11/19/2013 1108   VLDL 39.8 11/19/2013 1108   LDLCALC 122* 05/29/2013 0811   LDLDIRECT 131.1 11/19/2013 1108      Wt Readings from Last 3 Encounters:  03/05/16 137 lb 4 oz (62.256 kg)  01/05/16 137 lb 8 oz (62.37 kg)  09/16/15 140 lb (63.504 kg)      Other studies Reviewed: Additional studies/ records that were reviewed today include: Office note from primary care physician. Labs from December 2016. Review of the above records  demonstrates: Mild anemia with a hemoglobin of 11.7. Creatinine was 1.31. Potassium was 4.9.   ASSESSMENT AND PLAN:  1.  Moderate to severe pulmonary hypertension: This could be responsible for her symptoms of exertional shortness of breath and chest pain. The exact etiology of this is not entirely clear as the echo showed only grade 1 diastolic dysfunction. Given her worsening symptoms and echocardiogram findings, I suggested proceeding with a right and left cardiac catheterization and discussed risks and benefits with the patient and her daughter. After extensive discussion, the patient wants to think about this further before agreeing to proceed with the procedure. According to her, she is almost 82 years old and does not want anything extensive done. I explained to her that the catheterization is not a major surgery. In the meantime, I recommend continuing blood pressure control.  2. Essential hypertension: Difficult to control hypertension  with no evidence of renal artery stenosis or secondary hypertension. Blood pressure has been reasonably controlled on current medications including hydralazine 25 mg 3 times daily, spironolactone 25 mg daily, amlodipine and Bystolic.   3. Sinus bradycardia: She also has first degree AV block but overall she appears to be asymptomatic. We should not increase the dose of beta blocker. She reports worsening dyspnea since she was started on thyroid replacement therapy. Thus, I requested an echocardiogram.  4. Palpitations: Previous Holter monitor showed PACs and very short runs of SVT. Symptoms are controlled with Bystolic.      Disposition:   FU with me in 2 months  Signed,  Alice Sacramento, MD  03/05/2016 11:04 AM    [place]

## 2016-03-12 DIAGNOSIS — J449 Chronic obstructive pulmonary disease, unspecified: Secondary | ICD-10-CM | POA: Diagnosis not present

## 2016-03-12 DIAGNOSIS — J069 Acute upper respiratory infection, unspecified: Secondary | ICD-10-CM | POA: Diagnosis not present

## 2016-03-16 DIAGNOSIS — I10 Essential (primary) hypertension: Secondary | ICD-10-CM | POA: Diagnosis not present

## 2016-03-16 DIAGNOSIS — R0602 Shortness of breath: Secondary | ICD-10-CM | POA: Diagnosis not present

## 2016-03-16 DIAGNOSIS — J449 Chronic obstructive pulmonary disease, unspecified: Secondary | ICD-10-CM | POA: Diagnosis not present

## 2016-03-16 DIAGNOSIS — I279 Pulmonary heart disease, unspecified: Secondary | ICD-10-CM | POA: Diagnosis not present

## 2016-04-07 DIAGNOSIS — D509 Iron deficiency anemia, unspecified: Secondary | ICD-10-CM | POA: Diagnosis not present

## 2016-04-07 DIAGNOSIS — N182 Chronic kidney disease, stage 2 (mild): Secondary | ICD-10-CM | POA: Diagnosis not present

## 2016-04-07 DIAGNOSIS — E039 Hypothyroidism, unspecified: Secondary | ICD-10-CM | POA: Diagnosis not present

## 2016-04-12 DIAGNOSIS — J449 Chronic obstructive pulmonary disease, unspecified: Secondary | ICD-10-CM | POA: Diagnosis not present

## 2016-04-12 DIAGNOSIS — J069 Acute upper respiratory infection, unspecified: Secondary | ICD-10-CM | POA: Diagnosis not present

## 2016-04-13 DIAGNOSIS — I10 Essential (primary) hypertension: Secondary | ICD-10-CM | POA: Diagnosis not present

## 2016-04-13 DIAGNOSIS — N182 Chronic kidney disease, stage 2 (mild): Secondary | ICD-10-CM | POA: Diagnosis not present

## 2016-04-13 DIAGNOSIS — D509 Iron deficiency anemia, unspecified: Secondary | ICD-10-CM | POA: Diagnosis not present

## 2016-04-13 DIAGNOSIS — I279 Pulmonary heart disease, unspecified: Secondary | ICD-10-CM | POA: Diagnosis not present

## 2016-04-13 DIAGNOSIS — J449 Chronic obstructive pulmonary disease, unspecified: Secondary | ICD-10-CM | POA: Diagnosis not present

## 2016-05-03 DIAGNOSIS — R0602 Shortness of breath: Secondary | ICD-10-CM | POA: Diagnosis not present

## 2016-05-03 DIAGNOSIS — I10 Essential (primary) hypertension: Secondary | ICD-10-CM | POA: Diagnosis not present

## 2016-05-03 DIAGNOSIS — I279 Pulmonary heart disease, unspecified: Secondary | ICD-10-CM | POA: Diagnosis not present

## 2016-05-03 DIAGNOSIS — G4733 Obstructive sleep apnea (adult) (pediatric): Secondary | ICD-10-CM | POA: Diagnosis not present

## 2016-05-06 ENCOUNTER — Ambulatory Visit (INDEPENDENT_AMBULATORY_CARE_PROVIDER_SITE_OTHER): Payer: Medicare Other | Admitting: Cardiovascular Disease

## 2016-05-06 ENCOUNTER — Encounter: Payer: Self-pay | Admitting: Cardiovascular Disease

## 2016-05-06 VITALS — BP 140/50 | HR 54 | Ht 61.0 in | Wt 134.4 lb

## 2016-05-06 DIAGNOSIS — I272 Other secondary pulmonary hypertension: Secondary | ICD-10-CM

## 2016-05-06 DIAGNOSIS — R002 Palpitations: Secondary | ICD-10-CM

## 2016-05-06 DIAGNOSIS — I10 Essential (primary) hypertension: Secondary | ICD-10-CM

## 2016-05-06 MED ORDER — NITROGLYCERIN 0.4 MG SL SUBL: 0.4000 mg | SUBLINGUAL_TABLET | SUBLINGUAL | Status: AC | PRN

## 2016-05-06 NOTE — Patient Instructions (Signed)
Medication Instructions:  Your physician has recommended you make the following change in your medication:  START taking nitro stat 0.'4mg'$  AS NEEDED for chest pain   Labwork: none  Testing/Procedures: none  Follow-Up: Your physician wants you to follow-up in: six months with Dr. Fletcher Anon.  You will receive a reminder letter in the mail two months in advance. If you don't receive a letter, please call our office to schedule the follow-up appointment.   Any Other Special Instructions Will Be Listed Below (If Applicable).     If you need a refill on your cardiac medications before your next appointment, please call your pharmacy.

## 2016-05-06 NOTE — Progress Notes (Signed)
Cardiology Office Note   Date:  05/06/2016   ID:  Takima, Encina [birth year], MRN [id number]  PCP:  Lavera Guise, MD  Cardiologist:   Kathlyn Sacramento, MD   Chief Complaint  Patient presents with  . Hypertension    had some CP over a week ago, no active CP now, per pt      History of Present Illness: Alice Delgado is a 82 y.o. female who presents for a follow-up visit regarding refractory hypertension and  finding of pulmonary hypertension on echocardiogram. She has chronic medical conditions that include hypertension, hyperlipidemia, chronic back pain, hypothyroidism and asthma.  she reports prolonged history of hypertension which has been difficult to control recently mostly due to intolerance to multiple medications.  ACE inhibitors/ARB caused cough. Hydrochlorothiazide caused hyponatremia and hypokalemia. Hydralazine worsened lower extremity edema.  Renal artery duplex  showed no evidence of renal artery stenosis.  A Holter monitor was done for palpitations which showed occasional PACs and PVCs with very short runs of SVT. Echocardiogram in April 2017 showed normal LV systolic function, grade 1 diastolic dysfunction, mild mitral regurgitation, moderate tricuspid regurgitation with moderate to severe pulmonary hypertension with an estimated systolic pulmonary pressure of 63 mmHg with moderate right ventricular hypertrophy. Her symptoms included exertional chest pain and dyspnea. I suggested proceeding with a right and left cardiac catheterization given her symptoms and presence of pulmonary hypertension. However, she preferred a conservative approach. She reports that her symptoms are better than before. She only had one episode of chest pain since her last visit which lasted for about 10 minutes and was associated with mild palpitations. Exertional dyspnea is stable. She is using oxygen at night.    Past Medical History  Diagnosis Date  . Hypertension   . GERD  (gastroesophageal reflux disease)   . Hypercholesterolemia   . Hyperglycemia   . Vitamin D deficiency   . Hypothyroidism   . Peptic ulcer disease   . Diverticulosis   . Pernicious anemia   . Osteoarthritis     lumbar disc dz, cervical disc dz, hands  . Osteoporosis     intolerance to fosamax, evista and miacalcin  . Asthma   . Hiatal hernia     Past Surgical History  Procedure Laterality Date  . Abdominal hysterectomy      with benign ovarian tumor removed.    . Lumbar fusion       Current Outpatient Prescriptions  Medication Sig Dispense Refill  . albuterol (PROAIR HFA) 108 (90 BASE) MCG/ACT inhaler Inhale 1 puff into the lungs every 6 (six) hours as needed for wheezing or shortness of breath.    Marland Kitchen amLODipine (NORVASC) 5 MG tablet Take 5 mg by mouth daily.    . Azelastine HCl 0.15 % SOLN Place into the nose as needed.    . cyanocobalamin (,VITAMIN B-12,) 1000 MCG/ML injection Inject 1 mL (1,000 mcg total) into the muscle every 30 (thirty) days. 10 mL 1  . esomeprazole (NEXIUM) 40 MG capsule Take 40 mg by mouth daily at 12 noon.    . Fluticasone Furoate-Vilanterol (BREO ELLIPTA IN) Inhale into the lungs daily.    . hydrALAZINE (APRESOLINE) 50 MG tablet Take 1 tablet (50 mg total) by mouth 3 (three) times daily. 90 tablet 5  . HYDROcodone-acetaminophen (VICODIN) 5-325 mg TABS tablet One tablet q day to bid prn 40 tablet 0  . ipratropium-albuterol (DUONEB) 0.5-2.5 (3) MG/3ML SOLN Take 3 mLs by nebulization as needed.    Marland Kitchen  montelukast (SINGULAIR) 10 MG tablet Take 10 mg by mouth at bedtime.    . nebivolol (BYSTOLIC) 5 MG tablet Take 5 mg by mouth daily.    Marland Kitchen spironolactone (ALDACTONE) 25 MG tablet Take 25 mg by mouth daily.     No current facility-administered medications for this visit.    Allergies:   Augmentin; Evista; Fosamax; Lipitor; Miralax; Penicillins; and Zetia    Social History:  The patient  reports that she has quit smoking. Her smoking use included Cigarettes.  She quit after 15 years of use. She has never used smokeless tobacco. She reports that she does not drink alcohol or use illicit drugs.   Family History:  The patient's family history includes Breast cancer in her sister; Cancer in her mother; Heart attack in her father; Heart disease in her father. There is no history of Colon cancer.    ROS:  Please see the history of present illness.   Otherwise, review of systems are positive for none.   All other systems are reviewed and negative.    PHYSICAL EXAM: VS:  BP 140/50 mmHg  Pulse 54  Ht '5\' 1"'$  (1.549 m)  Wt 134 lb 6.4 oz (60.963 kg)  BMI 25.41 kg/m2  SpO2 98% , BMI Body mass index is 25.41 kg/(m^2). GEN: Well nourished, well developed, in no acute distress HEENT: normal Neck: no JVD, carotid bruits, or masses Cardiac: RRR; no murmurs, rubs, or gallops,no edema  Respiratory:  clear to auscultation bilaterally, normal work of breathing GI: soft, nontender, nondistended, + BS MS: no deformity or atrophy Skin: warm and dry, no rash Neuro:  Strength and sensation are intact Psych: euthymic mood, full affect   EKG:  EKG is not ordered today.   Recent Labs: No results found for requested labs within last 365 days.    Lipid Panel    Component Value Date/Time   CHOL 202* 11/19/2013 1108   TRIG 199.0* 11/19/2013 1108   HDL 44.40 11/19/2013 1108   CHOLHDL 5 11/19/2013 1108   VLDL 39.8 11/19/2013 1108   LDLCALC 122* 05/29/2013 0811   LDLDIRECT 131.1 11/19/2013 1108      Wt Readings from Last 3 Encounters:  05/06/16 134 lb 6.4 oz (60.963 kg)  03/05/16 137 lb 4 oz (62.256 kg)  01/05/16 137 lb 8 oz (62.37 kg)      Other studies Reviewed: Additional studies/ records that were reviewed today include: Office note from primary care physician. Labs from December 2016. Review of the above records demonstrates: Mild anemia with a hemoglobin of 11.7. Creatinine was 1.31. Potassium was 4.9.   ASSESSMENT AND PLAN:  1.  Moderate to  severe pulmonary hypertension: This could be responsible for her symptoms of exertional shortness of breath and chest pain.  Symptoms are stable overall and slightly improved from before. The patient declined invasive evaluation. I provided her with sublingual nitroglycerin to be used as needed for her episodes of rare chest pain.  2. Essential hypertension: Difficult to control hypertension with no evidence of renal artery stenosis or secondary hypertension. Blood pressure has been reasonably controlled on current medications including hydralazine 25 mg 3 times daily, spironolactone 25 mg daily, amlodipine and Bystolic.   3. Sinus bradycardia: She also has first degree AV block but overall she appears to be asymptomatic. We should not increase the dose of beta blocker.   4. Palpitations: Previous Holter monitor showed PACs and very short runs of SVT. Symptoms are controlled with Bystolic.  Disposition:   FU with me in 6 months  Signed,  Kathlyn Sacramento, MD  05/06/2016 11:29 AM    [place]

## 2016-05-12 DIAGNOSIS — J449 Chronic obstructive pulmonary disease, unspecified: Secondary | ICD-10-CM | POA: Diagnosis not present

## 2016-05-12 DIAGNOSIS — J069 Acute upper respiratory infection, unspecified: Secondary | ICD-10-CM | POA: Diagnosis not present

## 2016-06-08 ENCOUNTER — Other Ambulatory Visit: Payer: Self-pay | Admitting: Internal Medicine

## 2016-06-08 ENCOUNTER — Ambulatory Visit
Admission: RE | Admit: 2016-06-08 | Discharge: 2016-06-08 | Disposition: A | Discharge status: Home / Self Care | Payer: Medicare Other | Source: Ambulatory Visit | Attending: Internal Medicine | Admitting: Internal Medicine

## 2016-06-08 DIAGNOSIS — R0602 Shortness of breath: Secondary | ICD-10-CM

## 2016-06-08 DIAGNOSIS — R05 Cough: Secondary | ICD-10-CM

## 2016-06-08 DIAGNOSIS — J449 Chronic obstructive pulmonary disease, unspecified: Secondary | ICD-10-CM | POA: Insufficient documentation

## 2016-06-08 DIAGNOSIS — R059 Cough, unspecified: Secondary | ICD-10-CM

## 2016-06-08 DIAGNOSIS — I700 Atherosclerosis of aorta: Secondary | ICD-10-CM | POA: Insufficient documentation

## 2016-06-08 DIAGNOSIS — G4733 Obstructive sleep apnea (adult) (pediatric): Secondary | ICD-10-CM | POA: Diagnosis not present

## 2016-06-10 ENCOUNTER — Ambulatory Visit
Admission: RE | Admit: 2016-06-10 | Discharge: 2016-06-10 | Disposition: A | Discharge status: Home / Self Care | Payer: Medicare Other | Source: Ambulatory Visit | Attending: Internal Medicine | Admitting: Internal Medicine

## 2016-06-10 ENCOUNTER — Other Ambulatory Visit: Payer: Self-pay | Admitting: Internal Medicine

## 2016-06-10 ENCOUNTER — Other Ambulatory Visit
Admission: RE | Admit: 2016-06-10 | Discharge: 2016-06-10 | Disposition: A | Discharge status: Home / Self Care | Payer: Medicare Other | Source: Ambulatory Visit | Attending: Internal Medicine | Admitting: Internal Medicine

## 2016-06-10 DIAGNOSIS — I700 Atherosclerosis of aorta: Secondary | ICD-10-CM | POA: Insufficient documentation

## 2016-06-10 DIAGNOSIS — I2510 Atherosclerotic heart disease of native coronary artery without angina pectoris: Secondary | ICD-10-CM | POA: Diagnosis not present

## 2016-06-10 DIAGNOSIS — J90 Pleural effusion, not elsewhere classified: Secondary | ICD-10-CM | POA: Diagnosis not present

## 2016-06-10 DIAGNOSIS — R918 Other nonspecific abnormal finding of lung field: Secondary | ICD-10-CM | POA: Diagnosis not present

## 2016-06-10 DIAGNOSIS — R0602 Shortness of breath: Secondary | ICD-10-CM | POA: Diagnosis not present

## 2016-06-10 DIAGNOSIS — R935 Abnormal findings on diagnostic imaging of other abdominal regions, including retroperitoneum: Secondary | ICD-10-CM

## 2016-06-10 LAB — CREATININE, SERUM
CREATININE: 1.48 mg/dL — AB (ref 0.44–1.00)
GFR, EST AFRICAN AMERICAN: 36 mL/min — AB (ref >60)
GFR, EST NON AFRICAN AMERICAN: 31 mL/min — AB (ref >60)

## 2016-06-10 MED ORDER — IOPAMIDOL (ISOVUE-300) INJECTION 61%: 75.0000 mL | Freq: Once | INTRAVENOUS | Status: DC | PRN

## 2016-06-10 MED ORDER — IOPAMIDOL (ISOVUE-300) INJECTION 61%
75.0000 mL | Freq: Once | INTRAVENOUS | Status: AC | PRN
  Administered 2016-06-10: 60 mL via INTRAVENOUS

## 2016-06-12 DIAGNOSIS — J449 Chronic obstructive pulmonary disease, unspecified: Secondary | ICD-10-CM | POA: Diagnosis not present

## 2016-06-12 DIAGNOSIS — J069 Acute upper respiratory infection, unspecified: Secondary | ICD-10-CM | POA: Diagnosis not present

## 2016-06-14 ENCOUNTER — Ambulatory Visit
Admission: RE | Admit: 2016-06-14 | Discharge: 2016-06-14 | Disposition: A | Discharge status: Home / Self Care | Payer: Medicare Other | Source: Ambulatory Visit | Attending: Internal Medicine | Admitting: Internal Medicine

## 2016-06-14 DIAGNOSIS — R918 Other nonspecific abnormal finding of lung field: Secondary | ICD-10-CM | POA: Diagnosis not present

## 2016-06-14 DIAGNOSIS — D381 Neoplasm of uncertain behavior of trachea, bronchus and lung: Secondary | ICD-10-CM | POA: Diagnosis not present

## 2016-06-14 DIAGNOSIS — J9819 Other pulmonary collapse: Secondary | ICD-10-CM | POA: Insufficient documentation

## 2016-06-14 DIAGNOSIS — J90 Pleural effusion, not elsewhere classified: Secondary | ICD-10-CM | POA: Diagnosis not present

## 2016-06-14 DIAGNOSIS — J449 Chronic obstructive pulmonary disease, unspecified: Secondary | ICD-10-CM | POA: Diagnosis not present

## 2016-06-14 DIAGNOSIS — R935 Abnormal findings on diagnostic imaging of other abdominal regions, including retroperitoneum: Secondary | ICD-10-CM | POA: Diagnosis not present

## 2016-06-14 DIAGNOSIS — R590 Localized enlarged lymph nodes: Secondary | ICD-10-CM | POA: Insufficient documentation

## 2016-06-14 DIAGNOSIS — R0602 Shortness of breath: Secondary | ICD-10-CM | POA: Diagnosis not present

## 2016-06-14 LAB — GLUCOSE, CAPILLARY: GLUCOSE-CAPILLARY: 91 mg/dL (ref 65–99)

## 2016-06-14 MED ORDER — FLUDEOXYGLUCOSE F - 18 (FDG) INJECTION
12.0000 | Freq: Once | INTRAVENOUS | Status: AC | PRN
  Administered 2016-06-14: 13.031 via INTRAVENOUS

## 2016-06-16 ENCOUNTER — Ambulatory Visit
Admission: RE | Admit: 2016-06-16 | Discharge: 2016-06-16 | Disposition: A | Discharge status: Home / Self Care | Payer: Medicare Other | Source: Ambulatory Visit | Attending: Internal Medicine | Admitting: Internal Medicine

## 2016-06-16 ENCOUNTER — Encounter
Admission: RE | Disposition: A | Discharge status: Home / Self Care | Payer: Self-pay | Source: Ambulatory Visit | Attending: Internal Medicine

## 2016-06-16 DIAGNOSIS — Z880 Allergy status to penicillin: Secondary | ICD-10-CM | POA: Diagnosis not present

## 2016-06-16 DIAGNOSIS — E039 Hypothyroidism, unspecified: Secondary | ICD-10-CM | POA: Insufficient documentation

## 2016-06-16 DIAGNOSIS — M1990 Unspecified osteoarthritis, unspecified site: Secondary | ICD-10-CM | POA: Insufficient documentation

## 2016-06-16 DIAGNOSIS — Z87891 Personal history of nicotine dependence: Secondary | ICD-10-CM | POA: Diagnosis not present

## 2016-06-16 DIAGNOSIS — E7800 Pure hypercholesterolemia, unspecified: Secondary | ICD-10-CM | POA: Insufficient documentation

## 2016-06-16 DIAGNOSIS — I10 Essential (primary) hypertension: Secondary | ICD-10-CM | POA: Insufficient documentation

## 2016-06-16 DIAGNOSIS — Z9889 Other specified postprocedural states: Secondary | ICD-10-CM

## 2016-06-16 DIAGNOSIS — K219 Gastro-esophageal reflux disease without esophagitis: Secondary | ICD-10-CM | POA: Insufficient documentation

## 2016-06-16 DIAGNOSIS — C3412 Malignant neoplasm of upper lobe, left bronchus or lung: Secondary | ICD-10-CM | POA: Diagnosis not present

## 2016-06-16 DIAGNOSIS — R918 Other nonspecific abnormal finding of lung field: Secondary | ICD-10-CM | POA: Diagnosis present

## 2016-06-16 HISTORY — PX: FLEXIBLE BRONCHOSCOPY: SHX5094

## 2016-06-16 SURGERY — BRONCHOSCOPY, FLEXIBLE
Anesthesia: Moderate Sedation

## 2016-06-16 MED ORDER — LIDOCAINE HCL 2 % EX GEL: 1.0000 "application " | Freq: Once | CUTANEOUS | Status: DC

## 2016-06-16 MED ORDER — FENTANYL CITRATE (PF) 100 MCG/2ML IJ SOLN
INTRAMUSCULAR | Status: AC | PRN
  Administered 2016-06-16 (×3): 50 ug via INTRAVENOUS

## 2016-06-16 MED ORDER — MIDAZOLAM HCL 2 MG/2ML IJ SOLN
INTRAMUSCULAR | Status: AC | PRN
  Administered 2016-06-16 (×2): 1 mg via INTRAVENOUS
  Administered 2016-06-16: 2 mg via INTRAVENOUS

## 2016-06-16 MED ORDER — MIDAZOLAM HCL 5 MG/5ML IJ SOLN
INTRAMUSCULAR | Status: AC
  Filled 2016-06-16: qty 5

## 2016-06-16 MED ORDER — FENTANYL CITRATE (PF) 100 MCG/2ML IJ SOLN
INTRAMUSCULAR | Status: AC
  Filled 2016-06-16: qty 2

## 2016-06-16 MED ORDER — PHENYLEPHRINE HCL 0.25 % NA SOLN
1.0000 | Freq: Four times a day (QID) | NASAL | Status: DC | PRN
  Filled 2016-06-16: qty 15

## 2016-06-16 MED ORDER — BUTAMBEN-TETRACAINE-BENZOCAINE 2-2-14 % EX AERO
INHALATION_SPRAY | CUTANEOUS | Status: AC
  Filled 2016-06-16: qty 20

## 2016-06-16 MED ORDER — BUTAMBEN-TETRACAINE-BENZOCAINE 2-2-14 % EX AERO
1.0000 | INHALATION_SPRAY | Freq: Once | CUTANEOUS | Status: DC
  Filled 2016-06-16: qty 20

## 2016-06-16 NOTE — H&P (Signed)
Pulmonary Critical Care  Initial Consult Note   [name] IRS:[id number] DOB: Dec 21, 1928 DOA: 06/16/2016  Referring physician: Clayborn Bigness, MD PCP: Lavera Guise, MD   Chief Complaint: Lung Mass  HPI: Alice Delgado is a 82 y.o. female presented with c/o cough. She had a CT scan done which shows a left lung mass and also with presence of right nlung nodules. Subsequent PET scan was done which shows positive uptake in the masses. After discussion with the patient and family she does want to proceed to do a bronch for diagnosis.   Review of Systems:  Constitutional:  No weight loss, night sweats, Fevers, chills, fatigue.  HEENT:  No headaches, nasal congestion, post nasal drip,  Cardio-vascular:  No chest pain, Orthopnea, PND, swelling in lower extremities, anasarca, dizziness, palpitations  GI:  No heartburn, indigestion, abdominal pain, nausea, vomiting, diarrhea  Resp:  +shortness of breath with exertion. +cough, No coughing up of blood.  +wheezing Skin:  no rash or lesions.  Musculoskeletal:  No joint pain or swelling.   Remainder ROS performed and is unremarkable other than noted in HPI  Past Medical History:  Diagnosis Date  . Asthma   . Diverticulosis   . GERD (gastroesophageal reflux disease)   . Hiatal hernia   . Hypercholesterolemia   . Hyperglycemia   . Hypertension   . Hypothyroidism   . Osteoarthritis    lumbar disc dz, cervical disc dz, hands  . Osteoporosis    intolerance to fosamax, evista and miacalcin  . Peptic ulcer disease   . Pernicious anemia   . Vitamin D deficiency    Past Surgical History:  Procedure Laterality Date  . ABDOMINAL HYSTERECTOMY     with benign ovarian tumor removed.    . LUMBAR FUSION     Social History:  reports that she has quit smoking. Her smoking use included Cigarettes. She quit after 15.00 years of use. She has never used smokeless tobacco. She reports that she does not drink alcohol or use  drugs.  Allergies  Allergen Reactions  . Augmentin [Amoxicillin-Pot Clavulanate]   . Evista [Raloxifene]     Leg discomfort   . Fosamax [Alendronate Sodium]     Gi intolerance   . Lipitor [Atorvastatin]     Bloating and GI upset  . Miralax [Polyethylene Glycol]     Question of hives   . Penicillins Hives  . Zetia [Ezetimibe]     Family History  Problem Relation Age of Onset  . Heart disease Father     myocardial infarction  . Heart attack Father   . Cancer Mother     cancer of lymph nodes and kidney  . Breast cancer Sister   . Colon cancer Neg Hx     Prior to Admission medications   Medication Sig Start Date End Date Taking? Authorizing Provider  albuterol (PROAIR HFA) 108 (90 BASE) MCG/ACT inhaler Inhale 1 puff into the lungs every 6 (six) hours as needed for wheezing or shortness of breath.   Yes Historical Provider, MD  amLODipine (NORVASC) 5 MG tablet Take 5 mg by mouth daily.   Yes Historical Provider, MD  Azelastine HCl 0.15 % SOLN Place into the nose as needed.   Yes Historical Provider, MD  cyanocobalamin (,VITAMIN B-12,) 1000 MCG/ML injection Inject 1 mL (1,000 mcg total) into the muscle every 30 (thirty) days. 12/24/13  Yes Einar Pheasant, MD  esomeprazole (NEXIUM) 40 MG capsule Take 40 mg by mouth daily at 12 noon.  12/24/13  Yes Einar Pheasant, MD  Fluticasone Furoate-Vilanterol (BREO ELLIPTA IN) Inhale into the lungs daily.   Yes Historical Provider, MD  hydrALAZINE (APRESOLINE) 50 MG tablet Take 1 tablet (50 mg total) by mouth 3 (three) times daily. 05/05/15  Yes Wellington Hampshire, MD  HYDROcodone-acetaminophen (VICODIN) 5-325 mg TABS tablet One tablet q day to bid prn 03/19/14  Yes Einar Pheasant, MD  ipratropium-albuterol (DUONEB) 0.5-2.5 (3) MG/3ML SOLN Take 3 mLs by nebulization as needed.   Yes Historical Provider, MD  montelukast (SINGULAIR) 10 MG tablet Take 10 mg by mouth at bedtime.   Yes Historical Provider, MD  nebivolol (BYSTOLIC) 5 MG tablet Take 5 mg by  mouth daily.   Yes Historical Provider, MD  nitroGLYCERIN (NITROSTAT) 0.4 MG SL tablet Place 1 tablet (0.4 mg total) under the tongue every 5 (five) minutes as needed for chest pain. 05/06/16  Yes Wellington Hampshire, MD  spironolactone (ALDACTONE) 25 MG tablet Take 25 mg by mouth daily.   Yes Historical Provider, MD   Physical Exam: Vitals:   06/16/16 1139  BP: (!) 172/53  Pulse: (!) 59  Resp: 20  Temp: 98.2 F (36.8 C)  TempSrc: Oral  SpO2: 98%  Weight: 60.8 kg (134 lb)  Height: '5\' 1"'$  (1.549 m)    Wt Readings from Last 3 Encounters:  06/16/16 60.8 kg (134 lb)  05/06/16 61 kg (134 lb 6.4 oz)  03/05/16 62.3 kg (137 lb 4 oz)    General:  Appears calm and comfortable Eyes: PERRL, normal lids, irises & conjunctiva ENT: grossly normal hearing, lips & tongue Neck: no LAD, masses or thyromegaly Cardiovascular: RRR, no m/r/g. No LE edema. Respiratory: few rhonchi noted. Normal respiratory effort. Abdomen: soft, nontender Skin: no rash or induration seen on limited exam Musculoskeletal: grossly normal tone BUE/BLE Psychiatric: grossly normal mood and affect Neurologic: grossly non-focal.          Labs on Admission:  Basic Metabolic Panel:  Recent Labs Lab 06/10/16 1128  CREATININE 1.48*   Liver Function Tests: No results for input(s): AST, ALT, ALKPHOS, BILITOT, PROT, ALBUMIN in the last 168 hours. No results for input(s): LIPASE, AMYLASE in the last 168 hours. No results for input(s): AMMONIA in the last 168 hours. CBC: No results for input(s): WBC, NEUTROABS, HGB, HCT, MCV, PLT in the last 168 hours. Cardiac Enzymes: No results for input(s): CKTOTAL, CKMB, CKMBINDEX, TROPONINI in the last 168 hours.  BNP (last 3 results) No results for input(s): BNP in the last 8760 hours.  ProBNP (last 3 results) No results for input(s): PROBNP in the last 8760 hours.  CBG:  Recent Labs Lab 06/14/16 0910  GLUCAP 91    Radiological Exams on Admission: No results  found.  EKG: Independently reviewed.  Assessment/Plan Active Problems:   * No active hospital problems. *   1. Lung Mass -she will be scheduled for a bronchoscopy with planned biopsy -Have arranged for [facility] and pathology  Code Status: full code (must indicate code status--if unknown or must be presumed, indicate so)  Family Communication: Daughters and [employer] Daughter Disposition Plan: Home  Time spent: 28mn    I have personally obtained a history, examined the patient, evaluated laboratory and imaging results, formulated the assessment and plan and placed orders.  The Patient requires high complexity decision making for assessment and support.    SAllyne Gee MD FGold Coast SurgicenterPulmonary Critical Care Medicine Sleep Medicine

## 2016-06-16 NOTE — Op Note (Signed)
[facility] Patient Name: Alice Delgado Procedure Date: 06/16/2016 12:22 PM MRN: [id number] Account #: [id number] Date of Birth: 28-Mar-1929 Admit Type: Inpatient Age: 82 Room: [facility] PROCEDURE RM 01 Gender: Female Note Status: Finalized Attending MD: Allyne Gee , MD Procedure:         Bronchoscopy Indications:       Left upper lobe mass Providers:         Allyne Gee, MD Referring MD:       Medicines:         Midazolam 4 mg IV, Fentanyl 322 mcg IV Complications:     No immediate complications Procedure:         Pre-Anesthesia Assessment:                    - A History and Physical has been performed. The patient's                     medications, allergies and sensitivities have been                     reviewed.                    - The risks and benefits of the procedure and the sedation                     options and risks were discussed with the patient. All                     questions were answered and informed consent was obtained.                    - Pre-procedure physical examination revealed no                     contraindications to sedation.                    - ASA Grade Assessment: III - A patient with severe                     systemic disease.                    - After reviewing the risks and benefits, the patient was                     deemed in satisfactory condition to undergo the procedure.                    - The anesthesia plan was to use moderate                     sedation/analgesia.                    - Immediately prior to administration of medications, the                     patient was re-assessed for adequacy to receive sedatives.                    - The heart rate, respiratory rate, oxygen saturations,                     blood pressure, adequacy of pulmonary ventilation, and  response to care were monitored throughout the procedure.                    - The physical status of the patient was  re-assessed after                     the procedure.                    After obtaining informed consent, the bronchoscope was                     passed under direct vision. Throughout the procedure, the                     patient's blood pressure, pulse, and oxygen saturations                     were monitored continuously. the Bronchoscope Olympus                     BF-Q180 S# [id number] was introduced through the right                     nostril and advanced to the tracheobronchial tree. The                     procedure was accomplished without difficulty. The patient                     tolerated the procedure well. The total duration of the                     procedure was 45 minutes. Findings:      Left Lung Abnormalities: A completely obstructing mass was found       distally in the left mainstem bronchus and in the left upper lobe. The       mass was large and bloody, friable and necrotic. The lesion was not       traversed. Endobronchial biopsies were performed in the left upper lobe       using forceps and sent for routine cytology and histopathology       examination. Three samples were obtained. Washings were obtained in the       left upper lobe and sent for routine cytology. The return was bloody.       Multiple specimens were obtained and pooled into one specimen, which was       sent for analysis. Brushings were obtained in the left upper lobe and       sent for routine cytology. Three samples were obtained. Impression:        - Left upper lobe mass                    - A bloody, friable and necrotic mass was found in the                     left mainstem bronchus and in the left upper lobe. This                     lesion is likely malignant.                    - An endobronchial biopsy was performed.                    -  Washings were obtained.                    - An endobronchial and necrotic mass was found in the left                     upper lobe. This lesion  is likely malignant. Recommendation:    - Await biopsy, brushing, cytology and washing results. Devona Konig, MD Allyne Gee, MD 06/16/2016 12:53:15 PM This report has been signed electronically. Number of Addenda: 0 Note Initiated On: 06/16/2016 12:22 PM      [facility]

## 2016-06-16 NOTE — Discharge Instructions (Signed)
Flexible Bronchoscopy, Care After Refer to this sheet in the next few weeks. These instructions provide you with information on caring for yourself after your procedure. Your health care provider may also give you more specific instructions. Your treatment has been planned according to current medical practices, but problems sometimes occur. Call your health care provider if you have any problems or questions after your procedure.  WHAT TO EXPECT AFTER THE PROCEDURE It is normal to have the following symptoms for 24-48 hours after the procedure:   Increased cough.  Low-grade fever.  Sore throat or hoarse voice.  Small streaks of blood in your thick spit (sputum) if tissue samples were taken (biopsy). HOME CARE INSTRUCTIONS   Do not eat or drink anything for 2 hours after your procedure. Your nose and throat were numbed by medicine. If you try to eat or drink before the medicine wears off, food or drink could go into your lungs or you could burn yourself. After the numbness is gone and your cough and gag reflexes have returned, you may eat soft food and drink liquids slowly.   The day after the procedure, you can go back to your normal diet.   You may resume normal activities.   Keep all follow-up visits as directed by your health care provider. It is important to keep all your appointments, especially if tissue samples were taken for testing (biopsy). SEEK IMMEDIATE MEDICAL CARE IF:   You have increasing shortness of breath.   You become light-headed or faint.   You have chest pain.   You have any new concerning symptoms.  You cough up more than a small amount of blood.  The amount of blood you cough up increases. MAKE SURE YOU:  Understand these instructions.  Will watch your condition.  Will get help right away if you are not doing well or get worse.   This information is not intended to replace advice given to you by your health care provider. Make sure you discuss  any questions you have with your health care provider.   Document Released: 04/23/2005 Document Revised: 10/25/2014 Document Reviewed: 06/08/2013 Elsevier Interactive Patient Education [employer].

## 2016-06-17 ENCOUNTER — Encounter: Payer: Self-pay | Admitting: Certified Registered Nurse Anesthetist

## 2016-06-17 ENCOUNTER — Encounter: Payer: Self-pay | Admitting: Internal Medicine

## 2016-06-18 ENCOUNTER — Ambulatory Visit: Payer: Medicare Other | Admitting: Oncology

## 2016-06-18 DIAGNOSIS — J449 Chronic obstructive pulmonary disease, unspecified: Secondary | ICD-10-CM | POA: Diagnosis not present

## 2016-06-24 ENCOUNTER — Telehealth: Payer: Self-pay | Admitting: *Deleted

## 2016-06-24 DIAGNOSIS — C3412 Malignant neoplasm of upper lobe, left bronchus or lung: Secondary | ICD-10-CM | POA: Diagnosis not present

## 2016-06-24 DIAGNOSIS — J449 Chronic obstructive pulmonary disease, unspecified: Secondary | ICD-10-CM | POA: Diagnosis not present

## 2016-06-24 DIAGNOSIS — R0602 Shortness of breath: Secondary | ICD-10-CM | POA: Diagnosis not present

## 2016-06-24 NOTE — Telephone Encounter (Signed)
Contacted patient to introduce navigation program as well as inquire about cancelled medical oncology appointments. Patient reports that she is going to [facility] today to obtain her results and will contact me when she knows more.

## 2016-06-25 ENCOUNTER — Ambulatory Visit: Payer: Medicare Other | Admitting: Oncology

## 2016-06-28 ENCOUNTER — Other Ambulatory Visit: Payer: Self-pay | Admitting: Cardiovascular Disease

## 2016-06-28 DIAGNOSIS — C3492 Malignant neoplasm of unspecified part of left bronchus or lung: Secondary | ICD-10-CM | POA: Diagnosis not present

## 2016-06-29 ENCOUNTER — Telehealth: Payer: Self-pay | Admitting: *Deleted

## 2016-06-29 NOTE — Telephone Encounter (Signed)
Contacted patient in follow up from last call. Patient reports is seeing medical oncologist at [facility] on 07/07/16. Encouraged patient to ask appropriate questions at the appt and discussed potential treatments. Also encouraged her to contact me is she desires to have consultation with medical oncologist at [facility] as 2nd opinion.

## 2016-06-30 LAB — CYTOLOGY - NON PAP

## 2016-06-30 LAB — SURGICAL PATHOLOGY

## 2016-07-06 ENCOUNTER — Inpatient Hospital Stay: Payer: Medicare Other | Attending: Internal Medicine | Admitting: Internal Medicine

## 2016-07-06 DIAGNOSIS — M1990 Unspecified osteoarthritis, unspecified site: Secondary | ICD-10-CM | POA: Insufficient documentation

## 2016-07-06 DIAGNOSIS — K5790 Diverticulosis of intestine, part unspecified, without perforation or abscess without bleeding: Secondary | ICD-10-CM | POA: Diagnosis not present

## 2016-07-06 DIAGNOSIS — J90 Pleural effusion, not elsewhere classified: Secondary | ICD-10-CM | POA: Diagnosis not present

## 2016-07-06 DIAGNOSIS — R911 Solitary pulmonary nodule: Secondary | ICD-10-CM

## 2016-07-06 DIAGNOSIS — J449 Chronic obstructive pulmonary disease, unspecified: Secondary | ICD-10-CM | POA: Insufficient documentation

## 2016-07-06 DIAGNOSIS — I129 Hypertensive chronic kidney disease with stage 1 through stage 4 chronic kidney disease, or unspecified chronic kidney disease: Secondary | ICD-10-CM | POA: Diagnosis not present

## 2016-07-06 DIAGNOSIS — K449 Diaphragmatic hernia without obstruction or gangrene: Secondary | ICD-10-CM | POA: Insufficient documentation

## 2016-07-06 DIAGNOSIS — Z79899 Other long term (current) drug therapy: Secondary | ICD-10-CM | POA: Insufficient documentation

## 2016-07-06 DIAGNOSIS — Z803 Family history of malignant neoplasm of breast: Secondary | ICD-10-CM | POA: Insufficient documentation

## 2016-07-06 DIAGNOSIS — I700 Atherosclerosis of aorta: Secondary | ICD-10-CM | POA: Diagnosis not present

## 2016-07-06 DIAGNOSIS — E559 Vitamin D deficiency, unspecified: Secondary | ICD-10-CM | POA: Insufficient documentation

## 2016-07-06 DIAGNOSIS — C3412 Malignant neoplasm of upper lobe, left bronchus or lung: Secondary | ICD-10-CM | POA: Insufficient documentation

## 2016-07-06 DIAGNOSIS — M818 Other osteoporosis without current pathological fracture: Secondary | ICD-10-CM

## 2016-07-06 DIAGNOSIS — E7800 Pure hypercholesterolemia, unspecified: Secondary | ICD-10-CM | POA: Diagnosis not present

## 2016-07-06 DIAGNOSIS — Z8711 Personal history of peptic ulcer disease: Secondary | ICD-10-CM | POA: Insufficient documentation

## 2016-07-06 DIAGNOSIS — N189 Chronic kidney disease, unspecified: Secondary | ICD-10-CM | POA: Diagnosis not present

## 2016-07-06 DIAGNOSIS — D510 Vitamin B12 deficiency anemia due to intrinsic factor deficiency: Secondary | ICD-10-CM

## 2016-07-06 DIAGNOSIS — R0602 Shortness of breath: Secondary | ICD-10-CM | POA: Insufficient documentation

## 2016-07-06 DIAGNOSIS — Z808 Family history of malignant neoplasm of other organs or systems: Secondary | ICD-10-CM | POA: Diagnosis not present

## 2016-07-06 DIAGNOSIS — R739 Hyperglycemia, unspecified: Secondary | ICD-10-CM | POA: Insufficient documentation

## 2016-07-06 DIAGNOSIS — K219 Gastro-esophageal reflux disease without esophagitis: Secondary | ICD-10-CM

## 2016-07-06 DIAGNOSIS — E039 Hypothyroidism, unspecified: Secondary | ICD-10-CM | POA: Diagnosis not present

## 2016-07-06 DIAGNOSIS — Z87891 Personal history of nicotine dependence: Secondary | ICD-10-CM

## 2016-07-06 NOTE — Assessment & Plan Note (Addendum)
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  # Discussed the staging/pathology the patient's family in detail. Recommend palliative treatment. Discussed the average life expectancy is around one year.  # Discussed concurrent chemoradiation [a weekly chemotherapy with radiation sensitizing dose]; patient not keen on chemotherapy part; alternatively discussed radiation therapy alone; patient interested. Will consult radiation oncology.  # Discussed regarding systemic therapies including chemotherapy; immunotherapy. Check PDL 1; respect to pathology regarding tissue availability. If PDL staining greater than 50%- Keytruda would be a good option.  # Check MRI of the brain; also labs/ with the radiation oncology consultation.  # Also discussed regarding palliative care/hospice option- when her clinical status deteriorates.  # Patient has a second opinion awaiting at [facility] tomorrow; she'll call us and let us know where she wants to have her treatment. She is interested in having treatments locally.  # Patient follow-up with me tentatively in approximately 7-10 days.   Thank you Dr.Khan for allowing me to participate in the care of your pleasant patient. Please do not hesitate to contact me with questions or concerns in the interim.  # 60 minutes face-to-face with the patient discussing the above plan of care; more than 50% of time spent on prognosis/ natural history; counseling and coordination.

## 2016-07-06 NOTE — Progress Notes (Signed)
[facility] NOTE  Patient Care Team: Lavera Guise, MD as PCP - General (Internal Medicine) Wellington Hampshire, MD as Consulting Physician (Cardiology)  CHIEF COMPLAINTS/PURPOSE OF CONSULTATION: Lung cancer  #  Oncology History   # SEP 2017- SQUAMOUS CELL CA [s/p Bronch Dr.S Khan];PET - LEFT UPPER LOBE MASS- left upper lung collpase; mediastinal LN; Contralateral Right nodules [x3];STAGE IV [contralateral lung nodules]   # Check PDL-1  # Hx of smoking/ COPD/ CKD [creat ~ 1.5]     Primary cancer of left upper lobe of lung ([facility])   07/06/2016 Initial Diagnosis    Primary cancer of left upper lobe of lung ([facility])       HISTORY OF PRESENTING ILLNESS:  Alice Delgado 82 y.o.  female history of smoking quit in 1985 noted to have worsening shortness of breath in the last many months which finally led to a imaging including a PET scan that showed- largest left upper lobe mass; mediastinal adenopathy; and also contralateral right lung nodules. Patient went on to have bronchoscopy with biopsy- positive for squamous cell carcinoma. Patient is accompanied by her daughter to discuss further treatment options.  Patient has shortness of breath especially exertion. She has been on oxygen since May 2017. No significant hemoptysis. Positive for cough. No fevers. No chills. Mild weight loss. No bone pain. No headaches. No hoarseness of voice. Denies any unusual tingling and numbness.  ROS: A complete 10 point review of system is done which is negative except mentioned above in history of present illness  MEDICAL HISTORY:  Past Medical History:  Diagnosis Date  . Asthma   . Diverticulosis   . GERD (gastroesophageal reflux disease)   . Hiatal hernia   . Hypercholesterolemia   . Hyperglycemia   . Hypertension   . Hypothyroidism   . Osteoarthritis    lumbar disc dz, cervical disc dz, hands  . Osteoporosis    intolerance to fosamax, evista and miacalcin  . Peptic ulcer  disease   . Pernicious anemia   . Vitamin D deficiency     SURGICAL HISTORY: Past Surgical History:  Procedure Laterality Date  . ABDOMINAL HYSTERECTOMY     with benign ovarian tumor removed.    Marland Kitchen FLEXIBLE BRONCHOSCOPY N/A 06/16/2016   Procedure: FLEXIBLE BRONCHOSCOPY;  Surgeon: Allyne Gee, MD;  Location: [facility] ORS;  Service: Pulmonary;  Laterality: N/A;  . LUMBAR FUSION      SOCIAL HISTORY:Retired Network engineer. Lives in [place]- 2 daughters; quit smoking in 1985. No alcohol. Social History   Social History  . Marital status: Widowed    Spouse name: N/A  . Number of children: 2  . Years of education: N/A   Occupational History  . Not on file.   Social History Main Topics  . Smoking status: Former Smoker    Years: 15.00    Types: Cigarettes  . Smokeless tobacco: Never Used  . Alcohol use No  . Drug use: No  . Sexual activity: Not on file   Other Topics Concern  . Not on file   Social History Narrative  . No narrative on file    FAMILY HISTORY: Family History  Problem Relation Age of Onset  . Heart disease Father     myocardial infarction  . Heart attack Father   . Cancer Mother     cancer of lymph nodes and kidney  . Breast cancer Sister   . Colon cancer Neg Hx     ALLERGIES:  is  allergic to penicillins; augmentin [amoxicillin-pot clavulanate]; evista [raloxifene]; fosamax [alendronate sodium]; lipitor [atorvastatin]; miralax [polyethylene glycol]; and zetia [ezetimibe].  MEDICATIONS:  Current Outpatient Prescriptions  Medication Sig Dispense Refill  . albuterol (PROAIR HFA) 108 (90 BASE) MCG/ACT inhaler Inhale 1 puff into the lungs every 6 (six) hours as needed for wheezing or shortness of breath.    Marland Kitchen amLODipine (NORVASC) 5 MG tablet Take 5 mg by mouth daily.    Marland Kitchen BREO ELLIPTA 100-25 MCG/INH AEPB INL 1 PUFF PO D  4  . cyanocobalamin (,VITAMIN B-12,) 1000 MCG/ML injection Inject 1 mL (1,000 mcg total) into the muscle every 30 (thirty) days. 10 mL 1  .  esomeprazole (NEXIUM) 40 MG capsule Take 40 mg by mouth daily at 12 noon.    . hydrALAZINE (APRESOLINE) 50 MG tablet Take 1 tablet (50 mg total) by mouth 3 (three) times daily. 90 tablet 5  . HYDROcodone-acetaminophen (VICODIN) 5-325 mg TABS tablet One tablet q day to bid prn 40 tablet 0  . ipratropium-albuterol (DUONEB) 0.5-2.5 (3) MG/3ML SOLN Take 3 mLs by nebulization as needed.    . montelukast (SINGULAIR) 10 MG tablet Take 10 mg by mouth at bedtime.    . nebivolol (BYSTOLIC) 5 MG tablet Take 5 mg by mouth daily.    . nitroGLYCERIN (NITROSTAT) 0.4 MG SL tablet Place 1 tablet (0.4 mg total) under the tongue every 5 (five) minutes as needed for chest pain. 25 tablet 2  . spironolactone (ALDACTONE) 25 MG tablet TAKE 1 TABLET BY MOUTH DAILY 30 tablet 3  . Azelastine HCl 0.15 % SOLN Place into the nose as needed.     No current facility-administered medications for this visit.       Marland Kitchen  PHYSICAL EXAMINATION: ECOG PERFORMANCE STATUS: 1 - Symptomatic but completely ambulatory  Vitals:   07/06/16 1531  BP: (!) 147/83  Pulse: (!) 137  Resp: (!) 22  Temp: 98.7 F (37.1 C)   Filed Weights   07/06/16 1531  Weight: 128 lb 13.7 oz (58.4 kg)    GENERAL: Well-nourished well-developed; Alert, no distress and comfortable.  With 2 daughters. Walking herself. She is on 2 L of oxygen. EYES: no pallor or icterus OROPHARYNX: no thrush or ulceration; good dentition  NECK: supple, no masses felt LYMPH:  no palpable lymphadenopathy in the cervical, axillary or inguinal regions LUNGS: Decreased breath sounds on the left side compared to the right.  HEART/CVS: regular rate & rhythm and no murmurs; No lower extremity edema ABDOMEN: abdomen soft, non-tender and normal bowel sounds Musculoskeletal:no cyanosis of digits and no clubbing  PSYCH: alert & oriented x 3 with fluent speech NEURO: no focal motor/sensory deficits SKIN:  no rashes or significant lesions  LABORATORY DATA:  I have reviewed the  data as listed Lab Results  Component Value Date   WBC 7.5 06/22/2014   HGB 10.9 (L) 06/22/2014   HCT 35.0 06/22/2014   MCV 81 06/22/2014   PLT 173 06/22/2014    Recent Labs  06/10/16 1128  CREATININE 1.48*  GFRNONAA 31*  GFRAA 36*    RADIOGRAPHIC STUDIES: I have personally reviewed the radiological images as listed and agreed with the findings in the report. Dg Chest 2 View  Result Date: 06/08/2016 CLINICAL DATA:  Cough and shortness of breath for the past month ; history of asthma -COPD. EXAM: CHEST  2 VIEW COMPARISON:  CT scan of the chest dated March 26, 2014 FINDINGS: There is an abnormal soft tissue density mass in the left  perihilar region. This is new since the previous study. There is mild tenting of the left hemidiaphragm. The right lung is hyperinflated and clear. There is biapical pleural thickening. The cardiac silhouette is partially obscured on the left. There is no pulmonary vascular congestion. There is calcification within the wall of the aortic arch. There is no definite pleural effusion. There is moderate S shaped thoracolumbar scoliosis. IMPRESSION: New left perihilar density worrisome for lymphadenopathy or a parenchymal mass. Lung malignancy could present in this manner. Chest CT scanning now is recommended. Underlying COPD -reactive airway disease. Aortic atherosclerosis. These results will be called to the ordering clinician or representative by the Radiologist Assistant, and communication documented in the PACS or zVision Dashboard. Electronically Signed   By: David  Martinique M.D.   On: 06/08/2016 16:11   Ct Chest W Contrast  Result Date: 06/10/2016 CLINICAL DATA:  Abnormal chest x-ray EXAM: CT CHEST WITH CONTRAST TECHNIQUE: Multidetector CT imaging of the chest was performed during intravenous contrast administration. CONTRAST:  72m ISOVUE-300 IOPAMIDOL (ISOVUE-300) INJECTION 61% COMPARISON:  CT chest 03/26/2014 and chest x-ray 06/08/2016 FINDINGS: Cardiovascular:  Heart size within normal limits. Atherosclerotic calcifications of coronary arteries. Atherosclerotic calcifications of thoracic aorta. Mediastinum/Nodes: AP window lymph node Measures 1.4 cm. Left hilar lymph node or mass measures 2.3 cm. No sub- carinal adenopathy. No right hilar adenopathy. Lungs/Pleura: There is small left pleural effusion. There is triangular-shaped consolidation in lingula. Although there is some fluid in the lingular bronchus findings are highly suspicious for obstructive left hilar mass with atelectasis/ consolidation in lingula. There is small left pleural effusion. Axial image 20 there is a nodule in right upper lobe posteriorly measures 1.7 cm. Spiculated nodule in right upper lobe posteriorly axial image 16 measures 2.2 cm. There is a spiculated consolidation in right lower lobe measures 2.1 cm. Although findings may be due to multifocal infection metastatic disease cannot be excluded. Further correlation with PET scan biopsy or bronchoscopy is recommended. Upper Abdomen: The visualized upper abdomen shows no adrenal gland mass. Atherosclerotic calcifications of abdominal aorta and SMA. The visualized upper kidneys are unremarkable. Gallstones are noted within partially visualized gallbladder. Musculoskeletal: No destructive bony lesions are noted. Osteopenia and degenerative changes thoracic spine. IMPRESSION: 1. There is mass or adenopathy in left hilum near lingula measures 2.4 cm. Triangular-shaped consolidation in lingula. Findings highly suspicious for a obstructive left hilar mass. Additional multifocal consolidation noted in right upper and right lower lobe. Metastatic disease cannot be excluded. Further evaluation with PET scan, bronchoscopy and/or biopsy is recommended. 2. Small right pleural effusion. 3. Atherosclerotic calcifications of thoracic aorta and coronary arteries. 4. There is no pulmonary edema. These results were called by telephone at the time of interpretation on  06/10/2016 at 2:24 pm to Dr. SDevona Konig, who verbally acknowledged these results. Electronically Signed   By: LLahoma CrockerM.D.   On: 06/10/2016 14:24   Nm Pet Image Initial (pi) Skull Base To Thigh  Result Date: 06/14/2016 CLINICAL DATA:  Initial treatment strategy for left lung mass and right lung pulmonary nodules. EXAM: NUCLEAR MEDICINE PET SKULL BASE TO THIGH TECHNIQUE: 13.0 mCi F-18 FDG was injected intravenously. Full-ring PET imaging was performed from the skull base to thigh after the radiotracer. CT data was obtained and used for attenuation correction and anatomic localization. FASTING BLOOD GLUCOSE:  Value: 91 mg/dl COMPARISON:  Chest CT on 06/10/2016 FINDINGS: NECK No hypermetabolic lymph nodes in the neck. Diffuse hypermetabolic activity seen throughout the thyroid gland, without evidence of  focal thyroid nodule, consistent with thyroiditis. CHEST A central left upper lobe hypermetabolic mass is seen with involvement of the left hilum and mediastinum. This measures approximately 6.4 x 7.4 cm on 94/3, and has SUV max of 21.9. This causes obstruction of the central left upper lobe bronchus with left upper lobe collapse. 14 mm hypermetabolic mediastinal lymph node seen in the left paratracheal region, with SUV max of 11.5. 11 mm hypermetabolic subcarinal mediastinal lymph node seen with SUV max of 5.0. 1.8 cm spiculated pulmonary nodule in posterior right upper lobe on image 48/8 is hypermetabolic with SUV max of 6.7. Just inferior to this nodule in the posterior right upper lobe is a second 1.4 cm hypermetabolic pulmonary nodule with SUV max of 7.6. A spiculated nodule with small central focus of cavitation is seen in the anterior right lower lobe. This measures 1.4 x 2.2 cm on image 106/3, and has SUV max of 4.9. Tiny left pleural effusion is seen without evidence of focal hypermetabolism. Mild emphysema. Aortic atherosclerosis. Coronary artery calcification also noted. ABDOMEN/PELVIS No abnormal  hypermetabolic activity within the liver, pancreas, adrenal glands, or spleen. No hypermetabolic lymph nodes in the abdomen or pelvis. Cholelithiasis again noted, without evidence of cholecystitis. 2 cm lesion in posterior lower pole of right kidney has higher than fluid attenuation but shows no metabolic activity, consistent with a proteinaceous cyst. Colonic diverticulosis noted, without evidence of diverticulitis. Aortic atherosclerosis. Previous hysterectomy. SKELETON Focus of hypermetabolic activity is seen in the right lateral fifth rib, which corresponds to a benign-appearing fracture. No suspicious hypermetabolic bone lesions identified. Thoracolumbar scoliosis. IMPRESSION: 7 cm hypermetabolic central left upper lobe mass with involvement of the left hilum and mediastinum, and postobstructive left upper lobe collapse. This is consistent with primary bronchogenic carcinoma. Mild hypermetabolic left paratracheal and subcarinal mediastinal lymphadenopathy, consistent with metastatic disease. Tiny left pleural effusion. Two hypermetabolic nodules in posterior right upper lobe, and third hypermetabolic nodule in the right lower lobe. These are suspicious for synchronous bronchogenic carcinomas, with metastases considered less likely. No evidence of metastatic disease within the neck, abdomen, or pelvis. Electronically Signed   By: Earle Gell M.D.   On: 06/14/2016 12:36    ASSESSMENT & PLAN:   Primary cancer of left upper lobe of lung ([place]) # Squamous cell carcinoma of the left upper lobe with atelectasis left upper lung; and also contralateral lung nodules- stage IV lung cancer.  # Discussed the staging/pathology the patient's family in detail. Recommend palliative treatment. Discussed the average life expectancy is around one year.  # Discussed concurrent chemoradiation [a weekly chemotherapy with radiation sensitizing dose]; patient not keen on chemotherapy part; alternatively discussed radiation  therapy alone; patient interested. Will consult radiation oncology.  # Discussed regarding systemic therapies including chemotherapy; immunotherapy. Check PDL 1; respect to pathology regarding tissue availability. If PDL staining greater than 50%- Keytruda would be a good option.  # Check MRI of the brain; also labs/ with the radiation oncology consultation.  # Also discussed regarding palliative care/hospice option- when her clinical status deteriorates.  # Patient has a second opinion awaiting at [facility] tomorrow; she'll call us and let us know where she wants to have her treatment. She is interested in having treatments locally.  # Patient follow-up with me tentatively in approximately 7-10 days.   Thank you Dr.Khan for allowing me to participate in the care of your pleasant patient. Please do not hesitate to contact me with questions or concerns in the interim.  # 60  minutes face-to-face with the patient discussing the above plan of care; more than 50% of time spent on prognosis/ natural history; counseling and coordination.  # I reviewed the imaging independently [as summarized above]; and with the patient in detail.    All questions were answered. The patient knows to call the clinic with any problems, questions or concerns.       Cammie Sickle, MD 07/06/2016 5:46 PM

## 2016-07-07 DIAGNOSIS — C3490 Malignant neoplasm of unspecified part of unspecified bronchus or lung: Secondary | ICD-10-CM | POA: Diagnosis not present

## 2016-07-09 DIAGNOSIS — C3490 Malignant neoplasm of unspecified part of unspecified bronchus or lung: Secondary | ICD-10-CM | POA: Diagnosis not present

## 2016-07-12 ENCOUNTER — Encounter: Payer: Self-pay | Admitting: Radiation Oncology

## 2016-07-12 ENCOUNTER — Ambulatory Visit
Admission: RE | Admit: 2016-07-12 | Discharge: 2016-07-12 | Disposition: A | Discharge status: Home / Self Care | Payer: Medicare Other | Source: Ambulatory Visit | Attending: Radiation Oncology | Admitting: Radiation Oncology

## 2016-07-12 ENCOUNTER — Inpatient Hospital Stay: Payer: Medicare Other

## 2016-07-12 VITALS — BP 126/86 | HR 127 | Temp 98.1°F | Wt 128.4 lb

## 2016-07-12 DIAGNOSIS — I129 Hypertensive chronic kidney disease with stage 1 through stage 4 chronic kidney disease, or unspecified chronic kidney disease: Secondary | ICD-10-CM | POA: Diagnosis not present

## 2016-07-12 DIAGNOSIS — K5790 Diverticulosis of intestine, part unspecified, without perforation or abscess without bleeding: Secondary | ICD-10-CM | POA: Diagnosis not present

## 2016-07-12 DIAGNOSIS — J45909 Unspecified asthma, uncomplicated: Secondary | ICD-10-CM | POA: Diagnosis not present

## 2016-07-12 DIAGNOSIS — Z79899 Other long term (current) drug therapy: Secondary | ICD-10-CM | POA: Insufficient documentation

## 2016-07-12 DIAGNOSIS — Z808 Family history of malignant neoplasm of other organs or systems: Secondary | ICD-10-CM | POA: Diagnosis not present

## 2016-07-12 DIAGNOSIS — R634 Abnormal weight loss: Secondary | ICD-10-CM | POA: Insufficient documentation

## 2016-07-12 DIAGNOSIS — Z8711 Personal history of peptic ulcer disease: Secondary | ICD-10-CM | POA: Diagnosis not present

## 2016-07-12 DIAGNOSIS — M1990 Unspecified osteoarthritis, unspecified site: Secondary | ICD-10-CM | POA: Insufficient documentation

## 2016-07-12 DIAGNOSIS — K449 Diaphragmatic hernia without obstruction or gangrene: Secondary | ICD-10-CM | POA: Diagnosis not present

## 2016-07-12 DIAGNOSIS — D510 Vitamin B12 deficiency anemia due to intrinsic factor deficiency: Secondary | ICD-10-CM | POA: Diagnosis not present

## 2016-07-12 DIAGNOSIS — E7800 Pure hypercholesterolemia, unspecified: Secondary | ICD-10-CM | POA: Diagnosis not present

## 2016-07-12 DIAGNOSIS — Z803 Family history of malignant neoplasm of breast: Secondary | ICD-10-CM | POA: Diagnosis not present

## 2016-07-12 DIAGNOSIS — Z510 Encounter for antineoplastic radiation therapy: Secondary | ICD-10-CM | POA: Diagnosis not present

## 2016-07-12 DIAGNOSIS — I10 Essential (primary) hypertension: Secondary | ICD-10-CM | POA: Diagnosis not present

## 2016-07-12 DIAGNOSIS — C3412 Malignant neoplasm of upper lobe, left bronchus or lung: Secondary | ICD-10-CM

## 2016-07-12 DIAGNOSIS — M818 Other osteoporosis without current pathological fracture: Secondary | ICD-10-CM | POA: Insufficient documentation

## 2016-07-12 DIAGNOSIS — Z87891 Personal history of nicotine dependence: Secondary | ICD-10-CM | POA: Diagnosis not present

## 2016-07-12 DIAGNOSIS — R911 Solitary pulmonary nodule: Secondary | ICD-10-CM | POA: Diagnosis not present

## 2016-07-12 DIAGNOSIS — J449 Chronic obstructive pulmonary disease, unspecified: Secondary | ICD-10-CM | POA: Diagnosis not present

## 2016-07-12 DIAGNOSIS — D649 Anemia, unspecified: Secondary | ICD-10-CM | POA: Insufficient documentation

## 2016-07-12 DIAGNOSIS — E559 Vitamin D deficiency, unspecified: Secondary | ICD-10-CM | POA: Insufficient documentation

## 2016-07-12 DIAGNOSIS — E039 Hypothyroidism, unspecified: Secondary | ICD-10-CM | POA: Diagnosis not present

## 2016-07-12 DIAGNOSIS — R0602 Shortness of breath: Secondary | ICD-10-CM | POA: Diagnosis not present

## 2016-07-12 DIAGNOSIS — K219 Gastro-esophageal reflux disease without esophagitis: Secondary | ICD-10-CM | POA: Diagnosis not present

## 2016-07-12 DIAGNOSIS — N189 Chronic kidney disease, unspecified: Secondary | ICD-10-CM | POA: Diagnosis not present

## 2016-07-12 DIAGNOSIS — R739 Hyperglycemia, unspecified: Secondary | ICD-10-CM | POA: Diagnosis not present

## 2016-07-12 DIAGNOSIS — J90 Pleural effusion, not elsewhere classified: Secondary | ICD-10-CM | POA: Diagnosis not present

## 2016-07-12 DIAGNOSIS — I700 Atherosclerosis of aorta: Secondary | ICD-10-CM | POA: Diagnosis not present

## 2016-07-12 LAB — COMPREHENSIVE METABOLIC PANEL
ALK PHOS: 73 U/L (ref 38–126)
ALT: 8 U/L — ABNORMAL LOW (ref 14–54)
ANION GAP: 7 (ref 5–15)
AST: 16 U/L (ref 15–41)
Albumin: 4.1 g/dL (ref 3.5–5.0)
BILIRUBIN TOTAL: 0.6 mg/dL (ref 0.3–1.2)
BUN: 18 mg/dL (ref 6–20)
CALCIUM: 9.2 mg/dL (ref 8.9–10.3)
CO2: 25 mmol/L (ref 22–32)
Chloride: 99 mmol/L — ABNORMAL LOW (ref 101–111)
Creatinine, Ser: 1.31 mg/dL — ABNORMAL HIGH (ref 0.44–1.00)
GFR, EST AFRICAN AMERICAN: 42 mL/min — AB (ref >60)
GFR, EST NON AFRICAN AMERICAN: 36 mL/min — AB (ref >60)
GLUCOSE: 116 mg/dL — AB (ref 65–99)
POTASSIUM: 4 mmol/L (ref 3.5–5.1)
Sodium: 131 mmol/L — ABNORMAL LOW (ref 135–145)
TOTAL PROTEIN: 7.2 g/dL (ref 6.5–8.1)

## 2016-07-12 LAB — CBC WITH DIFFERENTIAL/PLATELET
Basophils Absolute: 0.1 10*3/uL (ref 0–0.1)
Basophils Relative: 1 %
Eosinophils Absolute: 0.5 10*3/uL (ref 0–0.7)
Eosinophils Relative: 5 %
HEMATOCRIT: 35.4 % (ref 35.0–47.0)
HEMOGLOBIN: 11.8 g/dL — AB (ref 12.0–16.0)
LYMPHS ABS: 0.8 10*3/uL — AB (ref 1.0–3.6)
Lymphocytes Relative: 8 %
MCH: 26.7 pg (ref 26.0–34.0)
MCHC: 33.3 g/dL (ref 32.0–36.0)
MCV: 80.1 fL (ref 80.0–100.0)
MONO ABS: 0.8 10*3/uL (ref 0.2–0.9)
MONOS PCT: 8 %
NEUTROS ABS: 8.1 10*3/uL — AB (ref 1.4–6.5)
NEUTROS PCT: 78 %
Platelets: 251 10*3/uL (ref 150–440)
RBC: 4.42 MIL/uL (ref 3.80–5.20)
RDW: 15.4 % — AB (ref 11.5–14.5)
WBC: 10.3 10*3/uL (ref 3.6–11.0)

## 2016-07-12 LAB — LACTATE DEHYDROGENASE: LDH: 185 U/L (ref 98–192)

## 2016-07-12 NOTE — Consult Note (Signed)
NEW PATIENT EVALUATION  Name: Alice Delgado  MRN: [id number]  Date:   07/12/2016     DOB: [birth year]   This 82 y.o. female patient presents to the clinic for initial evaluation of squamous cell carcinoma the left upper lobe with atelectasis of left upper lobe.  REFERRING PHYSICIAN: Lavera Guise, MD  CHIEF COMPLAINT:  Chief Complaint  Patient presents with  . Lung Cancer    Pt is here for initial consultation of lung cancer.      DIAGNOSIS: The encounter diagnosis was Malignant neoplasm of upper lobe of left lung ([place]).   PREVIOUS INVESTIGATIONS:  PET CT and CT scans reviewed Pathology report reviewed Clinical notes reviewed Case presented at weekly tumor conference  HPI: Patient is a pleasant 82 year old female who is had a history of cough over the past year as well as some increasing shortness of breath. She eventually had a chest x-ray which led to a CT scan and PET CT scan showing a large left upper lobe mass with obstruction and atelectasis of the left upper lobe peripherally. She also has a PET scan positive mediastinal adenopathy as well as 2 other lesions within her right chest. The right chest lesions are small and indeterminate this time. She underwent bronchoscopy which was positive for squamous cell carcinoma. She is had proximal be 10 pound weight loss over the past several months. She has been seen by medical oncology PE L1 is trying to be checked for possibility of immunotherapy. Patient has ruled out chemotherapy. She is seen today for radiation oncology opinion. She does have a cough no hemoptysis. She continues on continual nasal oxygen and is wheelchair-bound.  PLANNED TREATMENT REGIMEN: I MRT hypofractionated radiation therapy to left upper lobe  PAST MEDICAL HISTORY:  has a past medical history of Asthma; Diverticulosis; GERD (gastroesophageal reflux disease); Hiatal hernia; Hypercholesterolemia; Hyperglycemia; Hypertension; Hypothyroidism; Osteoarthritis;  Osteoporosis; Peptic ulcer disease; Pernicious anemia; and Vitamin D deficiency.    PAST SURGICAL HISTORY:  Past Surgical History:  Procedure Laterality Date  . ABDOMINAL HYSTERECTOMY     with benign ovarian tumor removed.    Marland Kitchen FLEXIBLE BRONCHOSCOPY N/A 06/16/2016   Procedure: FLEXIBLE BRONCHOSCOPY;  Surgeon: Allyne Gee, MD;  Location: [facility] ORS;  Service: Pulmonary;  Laterality: N/A;  . LUMBAR FUSION      FAMILY HISTORY: family history includes Breast cancer in her sister; Cancer in her mother; Heart attack in her father; Heart disease in her father.  SOCIAL HISTORY:  reports that she has quit smoking. Her smoking use included Cigarettes. She quit after 15.00 years of use. She has never used smokeless tobacco. She reports that she does not drink alcohol or use drugs.  ALLERGIES: Penicillins; Augmentin [amoxicillin-pot clavulanate]; Evista [raloxifene]; Fosamax [alendronate sodium]; Lipitor [atorvastatin]; Miralax [polyethylene glycol]; and Zetia [ezetimibe]  MEDICATIONS:  Current Outpatient Prescriptions  Medication Sig Dispense Refill  . albuterol (PROAIR HFA) 108 (90 BASE) MCG/ACT inhaler Inhale 1 puff into the lungs every 6 (six) hours as needed for wheezing or shortness of breath.    Marland Kitchen amLODipine (NORVASC) 5 MG tablet Take 5 mg by mouth daily.    . Azelastine HCl 0.15 % SOLN Place into the nose as needed.    Marland Kitchen BREO ELLIPTA 100-25 MCG/INH AEPB INL 1 PUFF PO D  4  . cyanocobalamin (,VITAMIN B-12,) 1000 MCG/ML injection Inject 1 mL (1,000 mcg total) into the muscle every 30 (thirty) days. 10 mL 1  . esomeprazole (NEXIUM) 40 MG capsule Take 40 mg by  mouth daily at 12 noon.    Marland Kitchen HYDROcodone-acetaminophen (VICODIN) 5-325 mg TABS tablet One tablet q day to bid prn 40 tablet 0  . ipratropium-albuterol (DUONEB) 0.5-2.5 (3) MG/3ML SOLN Take 3 mLs by nebulization as needed.    . montelukast (SINGULAIR) 10 MG tablet Take 10 mg by mouth at bedtime.    . nebivolol (BYSTOLIC) 5 MG tablet Take 5  mg by mouth daily.    . nitroGLYCERIN (NITROSTAT) 0.4 MG SL tablet Place 1 tablet (0.4 mg total) under the tongue every 5 (five) minutes as needed for chest pain. 25 tablet 2  . spironolactone (ALDACTONE) 25 MG tablet TAKE 1 TABLET BY MOUTH DAILY 30 tablet 3  . hydrALAZINE (APRESOLINE) 50 MG tablet Take 1 tablet (50 mg total) by mouth 3 (three) times daily. 90 tablet 5   No current facility-administered medications for this encounter.     ECOG PERFORMANCE STATUS:  1 - Symptomatic but completely ambulatory  REVIEW OF SYSTEMS: Except for the weight loss and increasing shortness of breath and dyspnea on exertion Patient denies any weight loss, fatigue, weakness, fever, chills or night sweats. Patient denies any loss of vision, blurred vision. Patient denies any ringing  of the ears or hearing loss. No irregular heartbeat. Patient denies heart murmur or history of fainting. Patient denies any chest pain or pain radiating to her upper extremities. Patient denies any shortness of breath, difficulty breathing at night, cough or hemoptysis. Patient denies any swelling in the lower legs. Patient denies any nausea vomiting, vomiting of blood, or coffee ground material in the vomitus. Patient denies any stomach pain. Patient states has had normal bowel movements no significant constipation or diarrhea. Patient denies any dysuria, hematuria or significant nocturia. Patient denies any problems walking, swelling in the joints or loss of balance. Patient denies any skin changes, loss of hair or loss of weight. Patient denies any excessive worrying or anxiety or significant depression. Patient denies any problems with insomnia. Patient denies excessive thirst, polyuria, polydipsia. Patient denies any swollen glands, patient denies easy bruising or easy bleeding. Patient denies any recent infections, allergies or URI. Patient "s visual fields have not changed significantly in recent time.    PHYSICAL EXAM: BP 126/86    Pulse (!) 127   Temp 98.1 F (36.7 C)   Wt 128 lb 6.7 oz (58.3 kg)   BMI 24.26 kg/m  Well-developed elderly female in NAD on nasal oxygen and wheelchair-bound. Well-developed well-nourished patient in NAD. HEENT reveals PERLA, EOMI, discs not visualized.  Oral cavity is clear. No oral mucosal lesions are identified. Neck is clear without evidence of cervical or supraclavicular adenopathy. Lungs are clear to A&P. Cardiac examination is essentially unremarkable with regular rate and rhythm without murmur rub or thrill. Abdomen is benign with no organomegaly or masses noted. Motor sensory and DTR levels are equal and symmetric in the upper and lower extremities. Cranial nerves II through XII are grossly intact. Proprioception is intact. No peripheral adenopathy or edema is identified. No motor or sensory levels are noted. Crude visual fields are within normal range.  LABORATORY DATA: Pathology report reviewed    RADIOLOGY RESULTS: CT scan PET CT scan reviewed   IMPRESSION: Probable stage IIIa squamous cell carcinoma the left upper lobe with either metastatic or metachronous lesions in her right lung in 82 year old female.  PLAN: At this time like to go ahead with hypofractionated course of radiation therapy to her left upper lobe. I would concentrate in the area of PET  positivity and spare the atelectatic lung in the left upper lung periphery. I will plan on delivering 6000 cGy in 10 fractions. If the other lesions in her right lung continue to grow pose a problem the future we can do was go back and treat those with SB RT. Risks and benefits of treatment including possible dysphasia secondary radiation esophagitis, fatigue, alteration of blood counts, skin reaction all were discussed in detail. I personally set up and ordered CT simulation for later this week. Patient also has an MRI scan for early next week will review that when it becomes available and make appropriate recommendations at that  time. Patient this point has declined chemotherapy although is being evaluated for possibility of immunotherapy.  I would like to take this opportunity to thank you for allowing me to participate in the care of your patient.Armstead Peaks., MD

## 2016-07-14 ENCOUNTER — Ambulatory Visit
Admission: RE | Admit: 2016-07-14 | Discharge: 2016-07-14 | Disposition: A | Discharge status: Home / Self Care | Payer: Medicare Other | Source: Ambulatory Visit | Attending: Radiation Oncology | Admitting: Radiation Oncology

## 2016-07-14 DIAGNOSIS — M818 Other osteoporosis without current pathological fracture: Secondary | ICD-10-CM | POA: Diagnosis not present

## 2016-07-14 DIAGNOSIS — R739 Hyperglycemia, unspecified: Secondary | ICD-10-CM | POA: Diagnosis not present

## 2016-07-14 DIAGNOSIS — E7800 Pure hypercholesterolemia, unspecified: Secondary | ICD-10-CM | POA: Diagnosis not present

## 2016-07-14 DIAGNOSIS — J449 Chronic obstructive pulmonary disease, unspecified: Secondary | ICD-10-CM | POA: Diagnosis not present

## 2016-07-14 DIAGNOSIS — Z87891 Personal history of nicotine dependence: Secondary | ICD-10-CM | POA: Diagnosis not present

## 2016-07-14 DIAGNOSIS — Z79899 Other long term (current) drug therapy: Secondary | ICD-10-CM | POA: Diagnosis not present

## 2016-07-14 DIAGNOSIS — Z803 Family history of malignant neoplasm of breast: Secondary | ICD-10-CM | POA: Diagnosis not present

## 2016-07-14 DIAGNOSIS — K5790 Diverticulosis of intestine, part unspecified, without perforation or abscess without bleeding: Secondary | ICD-10-CM | POA: Diagnosis not present

## 2016-07-14 DIAGNOSIS — D649 Anemia, unspecified: Secondary | ICD-10-CM | POA: Diagnosis not present

## 2016-07-14 DIAGNOSIS — M1990 Unspecified osteoarthritis, unspecified site: Secondary | ICD-10-CM | POA: Diagnosis not present

## 2016-07-14 DIAGNOSIS — K449 Diaphragmatic hernia without obstruction or gangrene: Secondary | ICD-10-CM | POA: Diagnosis not present

## 2016-07-14 DIAGNOSIS — C3412 Malignant neoplasm of upper lobe, left bronchus or lung: Secondary | ICD-10-CM | POA: Diagnosis not present

## 2016-07-14 DIAGNOSIS — E039 Hypothyroidism, unspecified: Secondary | ICD-10-CM | POA: Diagnosis not present

## 2016-07-14 DIAGNOSIS — J45909 Unspecified asthma, uncomplicated: Secondary | ICD-10-CM | POA: Diagnosis not present

## 2016-07-14 DIAGNOSIS — I10 Essential (primary) hypertension: Secondary | ICD-10-CM | POA: Diagnosis not present

## 2016-07-14 DIAGNOSIS — E559 Vitamin D deficiency, unspecified: Secondary | ICD-10-CM | POA: Diagnosis not present

## 2016-07-14 DIAGNOSIS — K219 Gastro-esophageal reflux disease without esophagitis: Secondary | ICD-10-CM | POA: Diagnosis not present

## 2016-07-14 DIAGNOSIS — Z510 Encounter for antineoplastic radiation therapy: Secondary | ICD-10-CM | POA: Diagnosis not present

## 2016-07-15 ENCOUNTER — Other Ambulatory Visit: Payer: Self-pay | Admitting: *Deleted

## 2016-07-15 DIAGNOSIS — C3412 Malignant neoplasm of upper lobe, left bronchus or lung: Secondary | ICD-10-CM

## 2016-07-16 LAB — CYTOLOGY - NON PAP

## 2016-07-18 DIAGNOSIS — J449 Chronic obstructive pulmonary disease, unspecified: Secondary | ICD-10-CM | POA: Diagnosis not present

## 2016-07-19 ENCOUNTER — Other Ambulatory Visit: Payer: Medicare Other

## 2016-07-19 ENCOUNTER — Institutional Professional Consult (permissible substitution): Payer: Medicare Other | Admitting: Radiation Oncology

## 2016-07-19 DIAGNOSIS — M1990 Unspecified osteoarthritis, unspecified site: Secondary | ICD-10-CM | POA: Diagnosis not present

## 2016-07-19 DIAGNOSIS — D649 Anemia, unspecified: Secondary | ICD-10-CM | POA: Diagnosis not present

## 2016-07-19 DIAGNOSIS — Z510 Encounter for antineoplastic radiation therapy: Secondary | ICD-10-CM | POA: Diagnosis not present

## 2016-07-19 DIAGNOSIS — E559 Vitamin D deficiency, unspecified: Secondary | ICD-10-CM | POA: Diagnosis not present

## 2016-07-19 DIAGNOSIS — I10 Essential (primary) hypertension: Secondary | ICD-10-CM | POA: Diagnosis not present

## 2016-07-19 DIAGNOSIS — K5790 Diverticulosis of intestine, part unspecified, without perforation or abscess without bleeding: Secondary | ICD-10-CM | POA: Diagnosis not present

## 2016-07-19 DIAGNOSIS — E039 Hypothyroidism, unspecified: Secondary | ICD-10-CM | POA: Diagnosis not present

## 2016-07-19 DIAGNOSIS — K219 Gastro-esophageal reflux disease without esophagitis: Secondary | ICD-10-CM | POA: Diagnosis not present

## 2016-07-19 DIAGNOSIS — K449 Diaphragmatic hernia without obstruction or gangrene: Secondary | ICD-10-CM | POA: Diagnosis not present

## 2016-07-19 DIAGNOSIS — Z803 Family history of malignant neoplasm of breast: Secondary | ICD-10-CM | POA: Diagnosis not present

## 2016-07-19 DIAGNOSIS — C3412 Malignant neoplasm of upper lobe, left bronchus or lung: Secondary | ICD-10-CM | POA: Diagnosis not present

## 2016-07-19 DIAGNOSIS — R739 Hyperglycemia, unspecified: Secondary | ICD-10-CM | POA: Diagnosis not present

## 2016-07-19 DIAGNOSIS — Z87891 Personal history of nicotine dependence: Secondary | ICD-10-CM | POA: Diagnosis not present

## 2016-07-19 DIAGNOSIS — Z79899 Other long term (current) drug therapy: Secondary | ICD-10-CM | POA: Diagnosis not present

## 2016-07-19 DIAGNOSIS — M818 Other osteoporosis without current pathological fracture: Secondary | ICD-10-CM | POA: Diagnosis not present

## 2016-07-19 DIAGNOSIS — J45909 Unspecified asthma, uncomplicated: Secondary | ICD-10-CM | POA: Diagnosis not present

## 2016-07-19 DIAGNOSIS — E7800 Pure hypercholesterolemia, unspecified: Secondary | ICD-10-CM | POA: Diagnosis not present

## 2016-07-20 ENCOUNTER — Inpatient Hospital Stay: Payer: Medicare Other | Attending: Internal Medicine | Admitting: Internal Medicine

## 2016-07-20 ENCOUNTER — Ambulatory Visit
Admission: RE | Admit: 2016-07-20 | Discharge: 2016-07-20 | Disposition: A | Discharge status: Home / Self Care | Payer: Medicare Other | Source: Ambulatory Visit | Attending: Internal Medicine | Admitting: Internal Medicine

## 2016-07-20 VITALS — BP 138/83 | HR 100 | Temp 97.6°F | Ht 61.0 in | Wt 129.0 lb

## 2016-07-20 DIAGNOSIS — C3412 Malignant neoplasm of upper lobe, left bronchus or lung: Secondary | ICD-10-CM | POA: Insufficient documentation

## 2016-07-20 DIAGNOSIS — E039 Hypothyroidism, unspecified: Secondary | ICD-10-CM | POA: Diagnosis not present

## 2016-07-20 DIAGNOSIS — G3189 Other specified degenerative diseases of nervous system: Secondary | ICD-10-CM | POA: Insufficient documentation

## 2016-07-20 DIAGNOSIS — E559 Vitamin D deficiency, unspecified: Secondary | ICD-10-CM

## 2016-07-20 DIAGNOSIS — M4802 Spinal stenosis, cervical region: Secondary | ICD-10-CM | POA: Insufficient documentation

## 2016-07-20 DIAGNOSIS — K219 Gastro-esophageal reflux disease without esophagitis: Secondary | ICD-10-CM | POA: Diagnosis not present

## 2016-07-20 DIAGNOSIS — Y842 Radiological procedure and radiotherapy as the cause of abnormal reaction of the patient, or of later complication, without mention of misadventure at the time of the procedure: Secondary | ICD-10-CM | POA: Diagnosis not present

## 2016-07-20 DIAGNOSIS — Z87891 Personal history of nicotine dependence: Secondary | ICD-10-CM | POA: Diagnosis not present

## 2016-07-20 DIAGNOSIS — K5790 Diverticulosis of intestine, part unspecified, without perforation or abscess without bleeding: Secondary | ICD-10-CM | POA: Insufficient documentation

## 2016-07-20 DIAGNOSIS — J449 Chronic obstructive pulmonary disease, unspecified: Secondary | ICD-10-CM | POA: Insufficient documentation

## 2016-07-20 DIAGNOSIS — D510 Vitamin B12 deficiency anemia due to intrinsic factor deficiency: Secondary | ICD-10-CM | POA: Insufficient documentation

## 2016-07-20 DIAGNOSIS — R739 Hyperglycemia, unspecified: Secondary | ICD-10-CM | POA: Insufficient documentation

## 2016-07-20 DIAGNOSIS — R900 Intracranial space-occupying lesion found on diagnostic imaging of central nervous system: Secondary | ICD-10-CM | POA: Diagnosis not present

## 2016-07-20 DIAGNOSIS — M1990 Unspecified osteoarthritis, unspecified site: Secondary | ICD-10-CM

## 2016-07-20 DIAGNOSIS — Z8711 Personal history of peptic ulcer disease: Secondary | ICD-10-CM | POA: Diagnosis not present

## 2016-07-20 DIAGNOSIS — D219 Benign neoplasm of connective and other soft tissue, unspecified: Secondary | ICD-10-CM | POA: Diagnosis not present

## 2016-07-20 DIAGNOSIS — R1310 Dysphagia, unspecified: Secondary | ICD-10-CM | POA: Insufficient documentation

## 2016-07-20 DIAGNOSIS — I129 Hypertensive chronic kidney disease with stage 1 through stage 4 chronic kidney disease, or unspecified chronic kidney disease: Secondary | ICD-10-CM | POA: Insufficient documentation

## 2016-07-20 DIAGNOSIS — R5383 Other fatigue: Secondary | ICD-10-CM | POA: Insufficient documentation

## 2016-07-20 DIAGNOSIS — C3490 Malignant neoplasm of unspecified part of unspecified bronchus or lung: Secondary | ICD-10-CM | POA: Diagnosis not present

## 2016-07-20 DIAGNOSIS — E7800 Pure hypercholesterolemia, unspecified: Secondary | ICD-10-CM | POA: Diagnosis not present

## 2016-07-20 DIAGNOSIS — I10 Essential (primary) hypertension: Secondary | ICD-10-CM | POA: Diagnosis not present

## 2016-07-20 DIAGNOSIS — Z510 Encounter for antineoplastic radiation therapy: Secondary | ICD-10-CM

## 2016-07-20 DIAGNOSIS — M818 Other osteoporosis without current pathological fracture: Secondary | ICD-10-CM | POA: Insufficient documentation

## 2016-07-20 DIAGNOSIS — N189 Chronic kidney disease, unspecified: Secondary | ICD-10-CM | POA: Diagnosis not present

## 2016-07-20 DIAGNOSIS — J9811 Atelectasis: Secondary | ICD-10-CM | POA: Insufficient documentation

## 2016-07-20 MED ORDER — GADOBENATE DIMEGLUMINE 529 MG/ML IV SOLN
10.0000 mL | Freq: Once | INTRAVENOUS | Status: AC | PRN
  Administered 2016-07-20: 10 mL via INTRAVENOUS

## 2016-07-20 NOTE — Progress Notes (Signed)
[place] NOTE  Patient Care Team: Lavera Guise, MD as PCP - General (Internal Medicine) Wellington Hampshire, MD as Consulting Physician (Cardiology)  CHIEF COMPLAINTS/PURPOSE OF CONSULTATION: Lung cancer  #  Oncology History   # SEP 2017- SQUAMOUS CELL CA [s/p Bronch Dr.S Khan];PET - LEFT UPPER LOBE MASS- left upper lung collpase; mediastinal LN; Contralateral Right nodules [x3];STAGE IV [contralateral lung nodules]   # Check PDL-1  # Hx of smoking/ COPD/ CKD [creat ~ 1.5]     Primary cancer of left upper lobe of lung ([place])   07/06/2016 Initial Diagnosis    Primary cancer of left upper lobe of lung ([facility])       HISTORY OF PRESENTING ILLNESS:  Alice Delgado 82 y.o.  female with newly diagnosed squamous cell lung cancer stage IV; currently is here to review further treatment options.  Patient has met with radiation oncology; also met with medical oncology at [facility] for a second opinion.   Patient has shortness of breath especially exertion. No significant hemoptysis. Positive for cough. No fevers-especially the mornings.  No bone pain. No headaches. No hoarseness of voice. Denies any unusual tingling and numbness. No worsening headaches.  ROS: A complete 10 point review of system is done which is negative except mentioned above in history of present illness  MEDICAL HISTORY:  Past Medical History:  Diagnosis Date  . Asthma   . Diverticulosis   . GERD (gastroesophageal reflux disease)   . Hiatal hernia   . Hypercholesterolemia   . Hyperglycemia   . Hypertension   . Hypothyroidism   . Osteoarthritis    lumbar disc dz, cervical disc dz, hands  . Osteoporosis    intolerance to fosamax, evista and miacalcin  . Peptic ulcer disease   . Pernicious anemia   . Vitamin D deficiency     SURGICAL HISTORY: Past Surgical History:  Procedure Laterality Date  . ABDOMINAL HYSTERECTOMY     with benign ovarian tumor removed.    Marland Kitchen FLEXIBLE  BRONCHOSCOPY N/A 06/16/2016   Procedure: FLEXIBLE BRONCHOSCOPY;  Surgeon: Allyne Gee, MD;  Location: [facility] ORS;  Service: Pulmonary;  Laterality: N/A;  . 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 No alcohol. Social History   Social History  . Marital status: Widowed    Spouse name: N/A  . Number of children: 2  . Years of education: N/A   Occupational History  . Not on file.   Social History Main Topics  . Smoking status: Former Smoker    Years: 15.00    Types: Cigarettes  . Smokeless tobacco: Never Used  . Alcohol use No  . Drug use: No  . Sexual activity: Not on file   Other Topics Concern  . Not on file   Social History Narrative  . No narrative on file    FAMILY HISTORY: Family History  Problem Relation Age of Onset  . Heart disease Father     myocardial infarction  . Heart attack Father   . Cancer Mother     cancer of lymph nodes and kidney  . Breast cancer Sister   . Colon cancer Neg Hx     ALLERGIES:  is allergic to penicillins; augmentin [amoxicillin-pot clavulanate]; evista [raloxifene]; fosamax [alendronate sodium]; lipitor [atorvastatin]; miralax [polyethylene glycol]; and zetia [ezetimibe].  MEDICATIONS:  Current Outpatient Prescriptions  Medication Sig Dispense Refill  . albuterol (PROAIR HFA) 108 (90  BASE) MCG/ACT inhaler Inhale 1 puff into the lungs every 6 (six) hours as needed for wheezing or shortness of breath.    Marland Kitchen amLODipine (NORVASC) 5 MG tablet Take 5 mg by mouth daily.    . Azelastine HCl 0.15 % SOLN Place into the nose as needed.    Marland Kitchen BREO ELLIPTA 100-25 MCG/INH AEPB INL 1 PUFF PO D  4  . cyanocobalamin (,VITAMIN B-12,) 1000 MCG/ML injection Inject 1 mL (1,000 mcg total) into the muscle every 30 (thirty) days. 10 mL 1  . esomeprazole (NEXIUM) 40 MG capsule Take 40 mg by mouth daily at 12 noon.    . hydrALAZINE (APRESOLINE) 50 MG tablet Take 1 tablet (50 mg total) by mouth 3  (three) times daily. 90 tablet 5  . HYDROcodone-acetaminophen (VICODIN) 5-325 mg TABS tablet One tablet q day to bid prn 40 tablet 0  . ipratropium-albuterol (DUONEB) 0.5-2.5 (3) MG/3ML SOLN Take 3 mLs by nebulization as needed.    . montelukast (SINGULAIR) 10 MG tablet Take 10 mg by mouth at bedtime.    . nebivolol (BYSTOLIC) 5 MG tablet Take 5 mg by mouth daily.    . nitroGLYCERIN (NITROSTAT) 0.4 MG SL tablet Place 1 tablet (0.4 mg total) under the tongue every 5 (five) minutes as needed for chest pain. 25 tablet 2  . spironolactone (ALDACTONE) 25 MG tablet TAKE 1 TABLET BY MOUTH DAILY 30 tablet 3   No current facility-administered medications for this visit.       Marland Kitchen  PHYSICAL EXAMINATION: ECOG PERFORMANCE STATUS: 1 - Symptomatic but completely ambulatory  Vitals:   07/20/16 1352  BP: 138/83  Pulse: 100  Temp: 97.6 F (36.4 C)   Filed Weights   07/20/16 1352  Weight: 129 lb (58.5 kg)    GENERAL: Well-nourished well-developed; Alert, no distress and comfortable.  With 2 daughters. Walking herself. She is on 2 L of oxygen. EYES: no pallor or icterus OROPHARYNX: no thrush or ulceration; good dentition  NECK: supple, no masses felt LYMPH:  no palpable lymphadenopathy in the cervical, axillary or inguinal regions LUNGS: Decreased breath sounds on the left side compared to the right.  HEART/CVS: regular rate & rhythm and no murmurs; No lower extremity edema ABDOMEN: abdomen soft, non-tender and normal bowel sounds Musculoskeletal:no cyanosis of digits and no clubbing  PSYCH: alert & oriented x 3 with fluent speech NEURO: no focal motor/sensory deficits SKIN:  no rashes or significant lesions  LABORATORY DATA:  I have reviewed the data as listed Lab Results  Component Value Date   WBC 10.3 07/12/2016   HGB 11.8 (L) 07/12/2016   HCT 35.4 07/12/2016   MCV 80.1 07/12/2016   PLT 251 07/12/2016    Recent Labs  06/10/16 1128 07/12/16 1420  NA  --  131*  K  --  4.0  CL   --  99*  CO2  --  25  GLUCOSE  --  116*  BUN  --  18  CREATININE 1.48* 1.31*  CALCIUM  --  9.2  GFRNONAA 31* 36*  GFRAA 36* 42*  PROT  --  7.2  ALBUMIN  --  4.1  AST  --  16  ALT  --  8*  ALKPHOS  --  73  BILITOT  --  0.6    RADIOGRAPHIC STUDIES: I have personally reviewed the radiological images as listed and agreed with the findings in the report. No results found.  ASSESSMENT & PLAN:   Primary cancer of left upper lobe  of lung ([place]) # Squamous cell carcinoma of the left upper lobe with atelectasis left upper lung; and also contralateral lung nodules- stage IV lung cancer. Patient planning starting radiation this week on palliative basis.  # Systemic therapy- immunotherapy was again discussed; patient not too keen on systemic therapy.   # hospice/palliative care was discussed- hold off referral at this time.  # fatigue-gensing is okay. Check thyroid levels/profile at the  Radiation oncology appointment in [place]: [phone]/ lory- we will give call with MRI of the brain  # Follow-up with me in approximately 3 weeks.   # I reviewed the medical oncology consultation second opinion at [facility].  All questions were answered. The patient knows to call the clinic with any problems, questions or concerns.     Cammie Sickle, MD 07/20/2016 4:35 PM

## 2016-07-20 NOTE — Assessment & Plan Note (Addendum)
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 Patient planning starting radiation this week on palliative basis.  # Systemic therapy- immunotherapy was again discussed; patient not too keen on systemic therapy.   # hospice/palliative care was discussed- hold off referral at this time.  # fatigue-gensing is okay. Check thyroid levels/profile at the  Radiation oncology appointment in [place]: [phone]/ lory- we will give call with MRI of the brain  # Follow-up with me in approximately 3 weeks.

## 2016-07-21 DIAGNOSIS — D649 Anemia, unspecified: Secondary | ICD-10-CM | POA: Diagnosis not present

## 2016-07-21 DIAGNOSIS — M1990 Unspecified osteoarthritis, unspecified site: Secondary | ICD-10-CM | POA: Diagnosis not present

## 2016-07-21 DIAGNOSIS — E559 Vitamin D deficiency, unspecified: Secondary | ICD-10-CM | POA: Diagnosis not present

## 2016-07-21 DIAGNOSIS — E039 Hypothyroidism, unspecified: Secondary | ICD-10-CM | POA: Diagnosis not present

## 2016-07-21 DIAGNOSIS — I10 Essential (primary) hypertension: Secondary | ICD-10-CM | POA: Diagnosis not present

## 2016-07-21 DIAGNOSIS — E7800 Pure hypercholesterolemia, unspecified: Secondary | ICD-10-CM | POA: Diagnosis not present

## 2016-07-21 DIAGNOSIS — C3412 Malignant neoplasm of upper lobe, left bronchus or lung: Secondary | ICD-10-CM | POA: Diagnosis not present

## 2016-07-21 DIAGNOSIS — Z79899 Other long term (current) drug therapy: Secondary | ICD-10-CM | POA: Diagnosis not present

## 2016-07-21 DIAGNOSIS — K449 Diaphragmatic hernia without obstruction or gangrene: Secondary | ICD-10-CM | POA: Diagnosis not present

## 2016-07-21 DIAGNOSIS — Z87891 Personal history of nicotine dependence: Secondary | ICD-10-CM | POA: Diagnosis not present

## 2016-07-21 DIAGNOSIS — Z803 Family history of malignant neoplasm of breast: Secondary | ICD-10-CM | POA: Diagnosis not present

## 2016-07-21 DIAGNOSIS — M818 Other osteoporosis without current pathological fracture: Secondary | ICD-10-CM | POA: Diagnosis not present

## 2016-07-21 DIAGNOSIS — J45909 Unspecified asthma, uncomplicated: Secondary | ICD-10-CM | POA: Diagnosis not present

## 2016-07-21 DIAGNOSIS — K5790 Diverticulosis of intestine, part unspecified, without perforation or abscess without bleeding: Secondary | ICD-10-CM | POA: Diagnosis not present

## 2016-07-21 DIAGNOSIS — Z510 Encounter for antineoplastic radiation therapy: Secondary | ICD-10-CM | POA: Diagnosis not present

## 2016-07-21 DIAGNOSIS — K219 Gastro-esophageal reflux disease without esophagitis: Secondary | ICD-10-CM | POA: Diagnosis not present

## 2016-07-21 DIAGNOSIS — R739 Hyperglycemia, unspecified: Secondary | ICD-10-CM | POA: Diagnosis not present

## 2016-07-22 ENCOUNTER — Ambulatory Visit
Admission: RE | Admit: 2016-07-22 | Discharge: 2016-07-22 | Disposition: A | Discharge status: Home / Self Care | Payer: Medicare Other | Source: Ambulatory Visit | Attending: Radiation Oncology | Admitting: Radiation Oncology

## 2016-07-22 ENCOUNTER — Inpatient Hospital Stay: Payer: Medicare Other

## 2016-07-22 DIAGNOSIS — D649 Anemia, unspecified: Secondary | ICD-10-CM | POA: Diagnosis not present

## 2016-07-22 DIAGNOSIS — R5383 Other fatigue: Secondary | ICD-10-CM | POA: Diagnosis not present

## 2016-07-22 DIAGNOSIS — C3412 Malignant neoplasm of upper lobe, left bronchus or lung: Secondary | ICD-10-CM

## 2016-07-22 DIAGNOSIS — D219 Benign neoplasm of connective and other soft tissue, unspecified: Secondary | ICD-10-CM | POA: Diagnosis not present

## 2016-07-22 DIAGNOSIS — Z79899 Other long term (current) drug therapy: Secondary | ICD-10-CM | POA: Diagnosis not present

## 2016-07-22 DIAGNOSIS — Z87891 Personal history of nicotine dependence: Secondary | ICD-10-CM | POA: Diagnosis not present

## 2016-07-22 DIAGNOSIS — I129 Hypertensive chronic kidney disease with stage 1 through stage 4 chronic kidney disease, or unspecified chronic kidney disease: Secondary | ICD-10-CM | POA: Diagnosis not present

## 2016-07-22 DIAGNOSIS — Z803 Family history of malignant neoplasm of breast: Secondary | ICD-10-CM | POA: Diagnosis not present

## 2016-07-22 DIAGNOSIS — K449 Diaphragmatic hernia without obstruction or gangrene: Secondary | ICD-10-CM | POA: Diagnosis not present

## 2016-07-22 DIAGNOSIS — Z8711 Personal history of peptic ulcer disease: Secondary | ICD-10-CM | POA: Diagnosis not present

## 2016-07-22 DIAGNOSIS — R1310 Dysphagia, unspecified: Secondary | ICD-10-CM | POA: Diagnosis not present

## 2016-07-22 DIAGNOSIS — K219 Gastro-esophageal reflux disease without esophagitis: Secondary | ICD-10-CM | POA: Diagnosis not present

## 2016-07-22 DIAGNOSIS — J45909 Unspecified asthma, uncomplicated: Secondary | ICD-10-CM | POA: Diagnosis not present

## 2016-07-22 DIAGNOSIS — I10 Essential (primary) hypertension: Secondary | ICD-10-CM | POA: Diagnosis not present

## 2016-07-22 DIAGNOSIS — Z510 Encounter for antineoplastic radiation therapy: Secondary | ICD-10-CM | POA: Diagnosis not present

## 2016-07-22 DIAGNOSIS — K5790 Diverticulosis of intestine, part unspecified, without perforation or abscess without bleeding: Secondary | ICD-10-CM | POA: Diagnosis not present

## 2016-07-22 DIAGNOSIS — D510 Vitamin B12 deficiency anemia due to intrinsic factor deficiency: Secondary | ICD-10-CM | POA: Diagnosis not present

## 2016-07-22 DIAGNOSIS — E039 Hypothyroidism, unspecified: Secondary | ICD-10-CM | POA: Diagnosis not present

## 2016-07-22 DIAGNOSIS — J449 Chronic obstructive pulmonary disease, unspecified: Secondary | ICD-10-CM | POA: Diagnosis not present

## 2016-07-22 DIAGNOSIS — E559 Vitamin D deficiency, unspecified: Secondary | ICD-10-CM | POA: Diagnosis not present

## 2016-07-22 DIAGNOSIS — M1990 Unspecified osteoarthritis, unspecified site: Secondary | ICD-10-CM | POA: Diagnosis not present

## 2016-07-22 DIAGNOSIS — M818 Other osteoporosis without current pathological fracture: Secondary | ICD-10-CM | POA: Diagnosis not present

## 2016-07-22 DIAGNOSIS — E7800 Pure hypercholesterolemia, unspecified: Secondary | ICD-10-CM | POA: Diagnosis not present

## 2016-07-22 DIAGNOSIS — N189 Chronic kidney disease, unspecified: Secondary | ICD-10-CM | POA: Diagnosis not present

## 2016-07-22 DIAGNOSIS — R739 Hyperglycemia, unspecified: Secondary | ICD-10-CM | POA: Diagnosis not present

## 2016-07-22 DIAGNOSIS — J9811 Atelectasis: Secondary | ICD-10-CM | POA: Diagnosis not present

## 2016-07-22 LAB — CBC
HCT: 35.9 % (ref 35.0–47.0)
Hemoglobin: 12 g/dL (ref 12.0–16.0)
MCH: 26.8 pg (ref 26.0–34.0)
MCHC: 33.4 g/dL (ref 32.0–36.0)
MCV: 80.4 fL (ref 80.0–100.0)
PLATELETS: 248 10*3/uL (ref 150–440)
RBC: 4.47 MIL/uL (ref 3.80–5.20)
RDW: 14.7 % — AB (ref 11.5–14.5)
WBC: 8.1 10*3/uL (ref 3.6–11.0)

## 2016-07-23 ENCOUNTER — Telehealth: Payer: Self-pay | Admitting: *Deleted

## 2016-07-23 ENCOUNTER — Ambulatory Visit
Admission: RE | Admit: 2016-07-23 | Discharge: 2016-07-23 | Disposition: A | Discharge status: Home / Self Care | Payer: Medicare Other | Source: Ambulatory Visit | Attending: Radiation Oncology | Admitting: Radiation Oncology

## 2016-07-23 DIAGNOSIS — K219 Gastro-esophageal reflux disease without esophagitis: Secondary | ICD-10-CM | POA: Diagnosis not present

## 2016-07-23 DIAGNOSIS — Z87891 Personal history of nicotine dependence: Secondary | ICD-10-CM | POA: Diagnosis not present

## 2016-07-23 DIAGNOSIS — K449 Diaphragmatic hernia without obstruction or gangrene: Secondary | ICD-10-CM | POA: Diagnosis not present

## 2016-07-23 DIAGNOSIS — R739 Hyperglycemia, unspecified: Secondary | ICD-10-CM | POA: Diagnosis not present

## 2016-07-23 DIAGNOSIS — C3412 Malignant neoplasm of upper lobe, left bronchus or lung: Secondary | ICD-10-CM | POA: Diagnosis not present

## 2016-07-23 DIAGNOSIS — J45909 Unspecified asthma, uncomplicated: Secondary | ICD-10-CM | POA: Diagnosis not present

## 2016-07-23 DIAGNOSIS — I10 Essential (primary) hypertension: Secondary | ICD-10-CM | POA: Diagnosis not present

## 2016-07-23 DIAGNOSIS — K5790 Diverticulosis of intestine, part unspecified, without perforation or abscess without bleeding: Secondary | ICD-10-CM | POA: Diagnosis not present

## 2016-07-23 DIAGNOSIS — E559 Vitamin D deficiency, unspecified: Secondary | ICD-10-CM | POA: Diagnosis not present

## 2016-07-23 DIAGNOSIS — M1990 Unspecified osteoarthritis, unspecified site: Secondary | ICD-10-CM | POA: Diagnosis not present

## 2016-07-23 DIAGNOSIS — Z79899 Other long term (current) drug therapy: Secondary | ICD-10-CM | POA: Diagnosis not present

## 2016-07-23 DIAGNOSIS — Z510 Encounter for antineoplastic radiation therapy: Secondary | ICD-10-CM | POA: Diagnosis not present

## 2016-07-23 DIAGNOSIS — M818 Other osteoporosis without current pathological fracture: Secondary | ICD-10-CM | POA: Diagnosis not present

## 2016-07-23 DIAGNOSIS — E7800 Pure hypercholesterolemia, unspecified: Secondary | ICD-10-CM | POA: Diagnosis not present

## 2016-07-23 DIAGNOSIS — Z803 Family history of malignant neoplasm of breast: Secondary | ICD-10-CM | POA: Diagnosis not present

## 2016-07-23 DIAGNOSIS — E039 Hypothyroidism, unspecified: Secondary | ICD-10-CM | POA: Diagnosis not present

## 2016-07-23 DIAGNOSIS — D649 Anemia, unspecified: Secondary | ICD-10-CM | POA: Diagnosis not present

## 2016-07-23 LAB — THYROID PANEL WITH TSH
FREE THYROXINE INDEX: 2.1 (ref 1.2–4.9)
T3 UPTAKE RATIO: 27 % (ref 24–39)
T4, Total: 7.6 ug/dL (ref 4.5–12.0)
TSH: 4.45 u[IU]/mL (ref 0.450–4.500)

## 2016-07-23 NOTE — Telephone Encounter (Signed)
RN spoke with daughter Cecille Rubin.  patient's results of thyroid panel and mri of brain provided to patient's daughter. Reassurance provided. Results normal

## 2016-07-23 NOTE — Telephone Encounter (Signed)
-----   Message from Cammie Sickle, MD sent at 07/23/2016  8:04 AM EDT ----- Please inform patient's daughter Tomma Lightning number in my last office note]- thyroid panel is normal; and also reviewed the MRI the brain- no significant concerns for cancer with brain this time.

## 2016-07-26 ENCOUNTER — Ambulatory Visit: Payer: Medicare Other

## 2016-07-26 ENCOUNTER — Telehealth: Payer: Self-pay | Admitting: Cardiovascular Disease

## 2016-07-26 ENCOUNTER — Ambulatory Visit
Admission: RE | Admit: 2016-07-26 | Discharge: 2016-07-26 | Disposition: A | Discharge status: Home / Self Care | Payer: Medicare Other | Source: Ambulatory Visit | Attending: Radiation Oncology | Admitting: Radiation Oncology

## 2016-07-26 DIAGNOSIS — I10 Essential (primary) hypertension: Secondary | ICD-10-CM | POA: Diagnosis not present

## 2016-07-26 DIAGNOSIS — E559 Vitamin D deficiency, unspecified: Secondary | ICD-10-CM | POA: Diagnosis not present

## 2016-07-26 DIAGNOSIS — E039 Hypothyroidism, unspecified: Secondary | ICD-10-CM | POA: Diagnosis not present

## 2016-07-26 DIAGNOSIS — M1990 Unspecified osteoarthritis, unspecified site: Secondary | ICD-10-CM | POA: Diagnosis not present

## 2016-07-26 DIAGNOSIS — C3412 Malignant neoplasm of upper lobe, left bronchus or lung: Secondary | ICD-10-CM | POA: Diagnosis not present

## 2016-07-26 DIAGNOSIS — K449 Diaphragmatic hernia without obstruction or gangrene: Secondary | ICD-10-CM | POA: Diagnosis not present

## 2016-07-26 DIAGNOSIS — D649 Anemia, unspecified: Secondary | ICD-10-CM | POA: Diagnosis not present

## 2016-07-26 DIAGNOSIS — K219 Gastro-esophageal reflux disease without esophagitis: Secondary | ICD-10-CM | POA: Diagnosis not present

## 2016-07-26 DIAGNOSIS — Z87891 Personal history of nicotine dependence: Secondary | ICD-10-CM | POA: Diagnosis not present

## 2016-07-26 DIAGNOSIS — Z510 Encounter for antineoplastic radiation therapy: Secondary | ICD-10-CM | POA: Diagnosis not present

## 2016-07-26 DIAGNOSIS — M818 Other osteoporosis without current pathological fracture: Secondary | ICD-10-CM | POA: Diagnosis not present

## 2016-07-26 DIAGNOSIS — K5790 Diverticulosis of intestine, part unspecified, without perforation or abscess without bleeding: Secondary | ICD-10-CM | POA: Diagnosis not present

## 2016-07-26 DIAGNOSIS — E7800 Pure hypercholesterolemia, unspecified: Secondary | ICD-10-CM | POA: Diagnosis not present

## 2016-07-26 DIAGNOSIS — Z803 Family history of malignant neoplasm of breast: Secondary | ICD-10-CM | POA: Diagnosis not present

## 2016-07-26 DIAGNOSIS — Z79899 Other long term (current) drug therapy: Secondary | ICD-10-CM | POA: Diagnosis not present

## 2016-07-26 DIAGNOSIS — R739 Hyperglycemia, unspecified: Secondary | ICD-10-CM | POA: Diagnosis not present

## 2016-07-26 DIAGNOSIS — J45909 Unspecified asthma, uncomplicated: Secondary | ICD-10-CM | POA: Diagnosis not present

## 2016-07-26 NOTE — Telephone Encounter (Signed)
Pt daughter calling stating pt was recently diagnosed with lung cancer Pt is having some irregular heart rate Would like some advise  Please call

## 2016-07-26 NOTE — Telephone Encounter (Signed)
Pt reports she recently started radiation for lung cancer and has been experiencing increased SOB and rapid HR. She is aware SOB is r/t cancer diagnosis but would like to see Dr. Fletcher Anon regarding heart rhythm which she feels is irregular. She would like an OV sooner than January. Forward to scheduling.

## 2016-07-27 ENCOUNTER — Ambulatory Visit
Admission: RE | Admit: 2016-07-27 | Discharge: 2016-07-27 | Disposition: A | Discharge status: Home / Self Care | Payer: Medicare Other | Source: Ambulatory Visit | Attending: Radiation Oncology | Admitting: Radiation Oncology

## 2016-07-27 ENCOUNTER — Ambulatory Visit: Payer: Medicare Other

## 2016-07-27 DIAGNOSIS — K219 Gastro-esophageal reflux disease without esophagitis: Secondary | ICD-10-CM | POA: Diagnosis not present

## 2016-07-27 DIAGNOSIS — Z79899 Other long term (current) drug therapy: Secondary | ICD-10-CM | POA: Diagnosis not present

## 2016-07-27 DIAGNOSIS — R739 Hyperglycemia, unspecified: Secondary | ICD-10-CM | POA: Diagnosis not present

## 2016-07-27 DIAGNOSIS — I10 Essential (primary) hypertension: Secondary | ICD-10-CM | POA: Diagnosis not present

## 2016-07-27 DIAGNOSIS — E039 Hypothyroidism, unspecified: Secondary | ICD-10-CM | POA: Diagnosis not present

## 2016-07-27 DIAGNOSIS — C3412 Malignant neoplasm of upper lobe, left bronchus or lung: Secondary | ICD-10-CM | POA: Diagnosis not present

## 2016-07-27 DIAGNOSIS — E559 Vitamin D deficiency, unspecified: Secondary | ICD-10-CM | POA: Diagnosis not present

## 2016-07-27 DIAGNOSIS — Z87891 Personal history of nicotine dependence: Secondary | ICD-10-CM | POA: Diagnosis not present

## 2016-07-27 DIAGNOSIS — D649 Anemia, unspecified: Secondary | ICD-10-CM | POA: Diagnosis not present

## 2016-07-27 DIAGNOSIS — E7800 Pure hypercholesterolemia, unspecified: Secondary | ICD-10-CM | POA: Diagnosis not present

## 2016-07-27 DIAGNOSIS — Z510 Encounter for antineoplastic radiation therapy: Secondary | ICD-10-CM | POA: Diagnosis not present

## 2016-07-27 DIAGNOSIS — K5790 Diverticulosis of intestine, part unspecified, without perforation or abscess without bleeding: Secondary | ICD-10-CM | POA: Diagnosis not present

## 2016-07-27 DIAGNOSIS — M818 Other osteoporosis without current pathological fracture: Secondary | ICD-10-CM | POA: Diagnosis not present

## 2016-07-27 DIAGNOSIS — K449 Diaphragmatic hernia without obstruction or gangrene: Secondary | ICD-10-CM | POA: Diagnosis not present

## 2016-07-27 DIAGNOSIS — J45909 Unspecified asthma, uncomplicated: Secondary | ICD-10-CM | POA: Diagnosis not present

## 2016-07-27 DIAGNOSIS — Z803 Family history of malignant neoplasm of breast: Secondary | ICD-10-CM | POA: Diagnosis not present

## 2016-07-27 DIAGNOSIS — M1990 Unspecified osteoarthritis, unspecified site: Secondary | ICD-10-CM | POA: Diagnosis not present

## 2016-07-28 ENCOUNTER — Ambulatory Visit: Payer: Medicare Other

## 2016-07-28 ENCOUNTER — Ambulatory Visit
Admission: RE | Admit: 2016-07-28 | Discharge: 2016-07-28 | Disposition: A | Discharge status: Home / Self Care | Payer: Medicare Other | Source: Ambulatory Visit | Attending: Radiation Oncology | Admitting: Radiation Oncology

## 2016-07-28 DIAGNOSIS — Z803 Family history of malignant neoplasm of breast: Secondary | ICD-10-CM | POA: Diagnosis not present

## 2016-07-28 DIAGNOSIS — I10 Essential (primary) hypertension: Secondary | ICD-10-CM | POA: Diagnosis not present

## 2016-07-28 DIAGNOSIS — C3412 Malignant neoplasm of upper lobe, left bronchus or lung: Secondary | ICD-10-CM | POA: Diagnosis not present

## 2016-07-28 DIAGNOSIS — J45909 Unspecified asthma, uncomplicated: Secondary | ICD-10-CM | POA: Diagnosis not present

## 2016-07-28 DIAGNOSIS — Z79899 Other long term (current) drug therapy: Secondary | ICD-10-CM | POA: Diagnosis not present

## 2016-07-28 DIAGNOSIS — E039 Hypothyroidism, unspecified: Secondary | ICD-10-CM | POA: Diagnosis not present

## 2016-07-28 DIAGNOSIS — E559 Vitamin D deficiency, unspecified: Secondary | ICD-10-CM | POA: Diagnosis not present

## 2016-07-28 DIAGNOSIS — D649 Anemia, unspecified: Secondary | ICD-10-CM | POA: Diagnosis not present

## 2016-07-28 DIAGNOSIS — M1990 Unspecified osteoarthritis, unspecified site: Secondary | ICD-10-CM | POA: Diagnosis not present

## 2016-07-28 DIAGNOSIS — E7800 Pure hypercholesterolemia, unspecified: Secondary | ICD-10-CM | POA: Diagnosis not present

## 2016-07-28 DIAGNOSIS — Z87891 Personal history of nicotine dependence: Secondary | ICD-10-CM | POA: Diagnosis not present

## 2016-07-28 DIAGNOSIS — K449 Diaphragmatic hernia without obstruction or gangrene: Secondary | ICD-10-CM | POA: Diagnosis not present

## 2016-07-28 DIAGNOSIS — M818 Other osteoporosis without current pathological fracture: Secondary | ICD-10-CM | POA: Diagnosis not present

## 2016-07-28 DIAGNOSIS — R739 Hyperglycemia, unspecified: Secondary | ICD-10-CM | POA: Diagnosis not present

## 2016-07-28 DIAGNOSIS — K5790 Diverticulosis of intestine, part unspecified, without perforation or abscess without bleeding: Secondary | ICD-10-CM | POA: Diagnosis not present

## 2016-07-28 DIAGNOSIS — Z510 Encounter for antineoplastic radiation therapy: Secondary | ICD-10-CM | POA: Diagnosis not present

## 2016-07-28 DIAGNOSIS — K219 Gastro-esophageal reflux disease without esophagitis: Secondary | ICD-10-CM | POA: Diagnosis not present

## 2016-07-29 ENCOUNTER — Other Ambulatory Visit: Payer: Self-pay | Admitting: Internal Medicine

## 2016-07-29 ENCOUNTER — Encounter: Payer: Self-pay | Admitting: Internal Medicine

## 2016-07-29 ENCOUNTER — Ambulatory Visit
Admission: RE | Admit: 2016-07-29 | Discharge: 2016-07-29 | Disposition: A | Discharge status: Home / Self Care | Payer: Medicare Other | Source: Ambulatory Visit | Attending: Radiation Oncology | Admitting: Radiation Oncology

## 2016-07-29 ENCOUNTER — Ambulatory Visit: Payer: Medicare Other

## 2016-07-29 DIAGNOSIS — K449 Diaphragmatic hernia without obstruction or gangrene: Secondary | ICD-10-CM | POA: Diagnosis not present

## 2016-07-29 DIAGNOSIS — E039 Hypothyroidism, unspecified: Secondary | ICD-10-CM | POA: Diagnosis not present

## 2016-07-29 DIAGNOSIS — E7800 Pure hypercholesterolemia, unspecified: Secondary | ICD-10-CM | POA: Diagnosis not present

## 2016-07-29 DIAGNOSIS — R739 Hyperglycemia, unspecified: Secondary | ICD-10-CM | POA: Diagnosis not present

## 2016-07-29 DIAGNOSIS — K5790 Diverticulosis of intestine, part unspecified, without perforation or abscess without bleeding: Secondary | ICD-10-CM | POA: Diagnosis not present

## 2016-07-29 DIAGNOSIS — K219 Gastro-esophageal reflux disease without esophagitis: Secondary | ICD-10-CM | POA: Diagnosis not present

## 2016-07-29 DIAGNOSIS — Z803 Family history of malignant neoplasm of breast: Secondary | ICD-10-CM | POA: Diagnosis not present

## 2016-07-29 DIAGNOSIS — Z87891 Personal history of nicotine dependence: Secondary | ICD-10-CM | POA: Diagnosis not present

## 2016-07-29 DIAGNOSIS — J45909 Unspecified asthma, uncomplicated: Secondary | ICD-10-CM | POA: Diagnosis not present

## 2016-07-29 DIAGNOSIS — D649 Anemia, unspecified: Secondary | ICD-10-CM | POA: Diagnosis not present

## 2016-07-29 DIAGNOSIS — Z510 Encounter for antineoplastic radiation therapy: Secondary | ICD-10-CM | POA: Diagnosis not present

## 2016-07-29 DIAGNOSIS — E559 Vitamin D deficiency, unspecified: Secondary | ICD-10-CM | POA: Diagnosis not present

## 2016-07-29 DIAGNOSIS — M1990 Unspecified osteoarthritis, unspecified site: Secondary | ICD-10-CM | POA: Diagnosis not present

## 2016-07-29 DIAGNOSIS — C3412 Malignant neoplasm of upper lobe, left bronchus or lung: Secondary | ICD-10-CM | POA: Diagnosis not present

## 2016-07-29 DIAGNOSIS — Z79899 Other long term (current) drug therapy: Secondary | ICD-10-CM | POA: Diagnosis not present

## 2016-07-29 DIAGNOSIS — M818 Other osteoporosis without current pathological fracture: Secondary | ICD-10-CM | POA: Diagnosis not present

## 2016-07-29 DIAGNOSIS — I10 Essential (primary) hypertension: Secondary | ICD-10-CM | POA: Diagnosis not present

## 2016-07-29 NOTE — Progress Notes (Signed)
START ON PATHWAY REGIMEN - Non-Small Cell Lung  [id number]: Pembrolizumab 200 mg q21 Days Until Disease Progression, Unacceptable Toxicity, or up to 24 Months   A cycle is 21 days:     Pembrolizumab [facility])) 200 mg flat dose in 50 mL NS IV over 30 minutes every 21 days.  Inline filter required (low-protein binding) Dose Mod: None Additional Orders: Severe immune-mediated reactions can occur. See prescribing information for more details and required immediate management with steroids. Monitor thyroid, renal, liver function tests, glucose, and sodium at baseline and before each  dose of pembrolizumab. Ref: Keytruda(R) (pembrolizumab) prescribing information, 2016.  **Always confirm dose/schedule in your pharmacy ordering system**    Patient Characteristics: Stage IV Metastatic, Squamous, PS = 0, 1, First Line, PD-L1 Expression Positive  >= 50% (TPS) AJCC M Stage: X AJCC N Stage: X AJCC T Stage: X Current Disease Status: Distant Metastases AJCC Stage Grouping: IV Histology: Squamous Cell Line of therapy: First Line PD-L1 Expression Status: PD-L1 Positive >= 50% (TPS) Performance Status: PS = 0, 1 Would you be surprised if this patient died  in the next year? I would be surprised if this patient died in the next year  Intent of Therapy: Non-Curative / Palliative Intent, Discussed with Patient

## 2016-07-30 ENCOUNTER — Inpatient Hospital Stay: Payer: Medicare Other

## 2016-07-30 ENCOUNTER — Ambulatory Visit: Payer: Medicare Other

## 2016-07-30 ENCOUNTER — Ambulatory Visit: Admission: RE | Admit: 2016-07-30 | Payer: Medicare Other | Source: Ambulatory Visit

## 2016-07-30 DIAGNOSIS — C3412 Malignant neoplasm of upper lobe, left bronchus or lung: Secondary | ICD-10-CM

## 2016-07-30 DIAGNOSIS — D510 Vitamin B12 deficiency anemia due to intrinsic factor deficiency: Secondary | ICD-10-CM | POA: Diagnosis not present

## 2016-07-30 DIAGNOSIS — E039 Hypothyroidism, unspecified: Secondary | ICD-10-CM | POA: Diagnosis not present

## 2016-07-30 DIAGNOSIS — J449 Chronic obstructive pulmonary disease, unspecified: Secondary | ICD-10-CM | POA: Diagnosis not present

## 2016-07-30 DIAGNOSIS — N189 Chronic kidney disease, unspecified: Secondary | ICD-10-CM | POA: Diagnosis not present

## 2016-07-30 DIAGNOSIS — I129 Hypertensive chronic kidney disease with stage 1 through stage 4 chronic kidney disease, or unspecified chronic kidney disease: Secondary | ICD-10-CM | POA: Diagnosis not present

## 2016-07-30 DIAGNOSIS — R5383 Other fatigue: Secondary | ICD-10-CM | POA: Diagnosis not present

## 2016-07-30 DIAGNOSIS — E7800 Pure hypercholesterolemia, unspecified: Secondary | ICD-10-CM | POA: Diagnosis not present

## 2016-07-30 DIAGNOSIS — R1310 Dysphagia, unspecified: Secondary | ICD-10-CM | POA: Diagnosis not present

## 2016-07-30 DIAGNOSIS — J9811 Atelectasis: Secondary | ICD-10-CM | POA: Diagnosis not present

## 2016-07-30 DIAGNOSIS — R739 Hyperglycemia, unspecified: Secondary | ICD-10-CM | POA: Diagnosis not present

## 2016-07-30 DIAGNOSIS — K219 Gastro-esophageal reflux disease without esophagitis: Secondary | ICD-10-CM | POA: Diagnosis not present

## 2016-07-30 DIAGNOSIS — K5790 Diverticulosis of intestine, part unspecified, without perforation or abscess without bleeding: Secondary | ICD-10-CM | POA: Diagnosis not present

## 2016-07-30 DIAGNOSIS — Z87891 Personal history of nicotine dependence: Secondary | ICD-10-CM | POA: Diagnosis not present

## 2016-07-30 DIAGNOSIS — M818 Other osteoporosis without current pathological fracture: Secondary | ICD-10-CM | POA: Diagnosis not present

## 2016-07-30 DIAGNOSIS — E559 Vitamin D deficiency, unspecified: Secondary | ICD-10-CM | POA: Diagnosis not present

## 2016-07-30 DIAGNOSIS — D219 Benign neoplasm of connective and other soft tissue, unspecified: Secondary | ICD-10-CM | POA: Diagnosis not present

## 2016-07-30 DIAGNOSIS — M1990 Unspecified osteoarthritis, unspecified site: Secondary | ICD-10-CM | POA: Diagnosis not present

## 2016-07-30 DIAGNOSIS — Z8711 Personal history of peptic ulcer disease: Secondary | ICD-10-CM | POA: Diagnosis not present

## 2016-07-30 LAB — CBC WITH DIFFERENTIAL/PLATELET
Basophils Absolute: 0 10*3/uL (ref 0–0.1)
Basophils Relative: 1 %
EOS ABS: 0.2 10*3/uL (ref 0–0.7)
Eosinophils Relative: 3 %
HEMATOCRIT: 36.3 % (ref 35.0–47.0)
HEMOGLOBIN: 11.8 g/dL — AB (ref 12.0–16.0)
LYMPHS ABS: 0.3 10*3/uL — AB (ref 1.0–3.6)
LYMPHS PCT: 5 %
MCH: 26.4 pg (ref 26.0–34.0)
MCHC: 32.5 g/dL (ref 32.0–36.0)
MCV: 81.1 fL (ref 80.0–100.0)
MONOS PCT: 9 %
Monocytes Absolute: 0.6 10*3/uL (ref 0.2–0.9)
NEUTROS ABS: 5.3 10*3/uL (ref 1.4–6.5)
NEUTROS PCT: 82 %
Platelets: 252 10*3/uL (ref 150–440)
RBC: 4.47 MIL/uL (ref 3.80–5.20)
RDW: 14.6 % — ABNORMAL HIGH (ref 11.5–14.5)
WBC: 6.5 10*3/uL (ref 3.6–11.0)

## 2016-08-02 ENCOUNTER — Ambulatory Visit: Payer: Medicare Other

## 2016-08-02 ENCOUNTER — Ambulatory Visit
Admission: RE | Admit: 2016-08-02 | Discharge: 2016-08-02 | Disposition: A | Discharge status: Home / Self Care | Payer: Medicare Other | Source: Ambulatory Visit | Attending: Radiation Oncology | Admitting: Radiation Oncology

## 2016-08-02 DIAGNOSIS — Z803 Family history of malignant neoplasm of breast: Secondary | ICD-10-CM | POA: Diagnosis not present

## 2016-08-02 DIAGNOSIS — D649 Anemia, unspecified: Secondary | ICD-10-CM | POA: Diagnosis not present

## 2016-08-02 DIAGNOSIS — Z87891 Personal history of nicotine dependence: Secondary | ICD-10-CM | POA: Diagnosis not present

## 2016-08-02 DIAGNOSIS — M818 Other osteoporosis without current pathological fracture: Secondary | ICD-10-CM | POA: Diagnosis not present

## 2016-08-02 DIAGNOSIS — K449 Diaphragmatic hernia without obstruction or gangrene: Secondary | ICD-10-CM | POA: Diagnosis not present

## 2016-08-02 DIAGNOSIS — J45909 Unspecified asthma, uncomplicated: Secondary | ICD-10-CM | POA: Diagnosis not present

## 2016-08-02 DIAGNOSIS — K5790 Diverticulosis of intestine, part unspecified, without perforation or abscess without bleeding: Secondary | ICD-10-CM | POA: Diagnosis not present

## 2016-08-02 DIAGNOSIS — E559 Vitamin D deficiency, unspecified: Secondary | ICD-10-CM | POA: Diagnosis not present

## 2016-08-02 DIAGNOSIS — E7800 Pure hypercholesterolemia, unspecified: Secondary | ICD-10-CM | POA: Diagnosis not present

## 2016-08-02 DIAGNOSIS — I10 Essential (primary) hypertension: Secondary | ICD-10-CM | POA: Diagnosis not present

## 2016-08-02 DIAGNOSIS — E039 Hypothyroidism, unspecified: Secondary | ICD-10-CM | POA: Diagnosis not present

## 2016-08-02 DIAGNOSIS — Z79899 Other long term (current) drug therapy: Secondary | ICD-10-CM | POA: Diagnosis not present

## 2016-08-02 DIAGNOSIS — K219 Gastro-esophageal reflux disease without esophagitis: Secondary | ICD-10-CM | POA: Diagnosis not present

## 2016-08-02 DIAGNOSIS — M1990 Unspecified osteoarthritis, unspecified site: Secondary | ICD-10-CM | POA: Diagnosis not present

## 2016-08-02 DIAGNOSIS — R739 Hyperglycemia, unspecified: Secondary | ICD-10-CM | POA: Diagnosis not present

## 2016-08-02 DIAGNOSIS — C3412 Malignant neoplasm of upper lobe, left bronchus or lung: Secondary | ICD-10-CM | POA: Diagnosis not present

## 2016-08-02 DIAGNOSIS — Z510 Encounter for antineoplastic radiation therapy: Secondary | ICD-10-CM | POA: Diagnosis not present

## 2016-08-03 ENCOUNTER — Other Ambulatory Visit: Payer: Self-pay | Admitting: *Deleted

## 2016-08-03 ENCOUNTER — Ambulatory Visit
Admission: RE | Admit: 2016-08-03 | Discharge: 2016-08-03 | Disposition: A | Discharge status: Home / Self Care | Payer: Medicare Other | Source: Ambulatory Visit | Attending: Radiation Oncology | Admitting: Radiation Oncology

## 2016-08-03 ENCOUNTER — Ambulatory Visit: Payer: Medicare Other

## 2016-08-03 DIAGNOSIS — Z79899 Other long term (current) drug therapy: Secondary | ICD-10-CM | POA: Diagnosis not present

## 2016-08-03 DIAGNOSIS — K219 Gastro-esophageal reflux disease without esophagitis: Secondary | ICD-10-CM | POA: Diagnosis not present

## 2016-08-03 DIAGNOSIS — D649 Anemia, unspecified: Secondary | ICD-10-CM | POA: Diagnosis not present

## 2016-08-03 DIAGNOSIS — E039 Hypothyroidism, unspecified: Secondary | ICD-10-CM | POA: Diagnosis not present

## 2016-08-03 DIAGNOSIS — M1990 Unspecified osteoarthritis, unspecified site: Secondary | ICD-10-CM | POA: Diagnosis not present

## 2016-08-03 DIAGNOSIS — E559 Vitamin D deficiency, unspecified: Secondary | ICD-10-CM | POA: Diagnosis not present

## 2016-08-03 DIAGNOSIS — E7800 Pure hypercholesterolemia, unspecified: Secondary | ICD-10-CM | POA: Diagnosis not present

## 2016-08-03 DIAGNOSIS — Z510 Encounter for antineoplastic radiation therapy: Secondary | ICD-10-CM | POA: Diagnosis not present

## 2016-08-03 DIAGNOSIS — C3412 Malignant neoplasm of upper lobe, left bronchus or lung: Secondary | ICD-10-CM | POA: Diagnosis not present

## 2016-08-03 DIAGNOSIS — M818 Other osteoporosis without current pathological fracture: Secondary | ICD-10-CM | POA: Diagnosis not present

## 2016-08-03 DIAGNOSIS — K5790 Diverticulosis of intestine, part unspecified, without perforation or abscess without bleeding: Secondary | ICD-10-CM | POA: Diagnosis not present

## 2016-08-03 DIAGNOSIS — R739 Hyperglycemia, unspecified: Secondary | ICD-10-CM | POA: Diagnosis not present

## 2016-08-03 DIAGNOSIS — Z803 Family history of malignant neoplasm of breast: Secondary | ICD-10-CM | POA: Diagnosis not present

## 2016-08-03 DIAGNOSIS — I10 Essential (primary) hypertension: Secondary | ICD-10-CM | POA: Diagnosis not present

## 2016-08-03 DIAGNOSIS — Z87891 Personal history of nicotine dependence: Secondary | ICD-10-CM | POA: Diagnosis not present

## 2016-08-03 DIAGNOSIS — J45909 Unspecified asthma, uncomplicated: Secondary | ICD-10-CM | POA: Diagnosis not present

## 2016-08-03 DIAGNOSIS — K449 Diaphragmatic hernia without obstruction or gangrene: Secondary | ICD-10-CM | POA: Diagnosis not present

## 2016-08-03 MED ORDER — SUCRALFATE 1 G PO TABS: 1.0000 g | ORAL_TABLET | Freq: Three times a day (TID) | ORAL | 3 refills | Status: DC

## 2016-08-04 ENCOUNTER — Ambulatory Visit: Payer: Medicare Other

## 2016-08-04 ENCOUNTER — Inpatient Hospital Stay: Payer: Medicare Other | Admitting: Internal Medicine

## 2016-08-05 ENCOUNTER — Ambulatory Visit: Payer: Medicare Other

## 2016-08-06 ENCOUNTER — Ambulatory Visit: Payer: Medicare Other

## 2016-08-06 ENCOUNTER — Ambulatory Visit: Payer: Medicare Other | Admitting: Physician Assistant

## 2016-08-09 ENCOUNTER — Inpatient Hospital Stay (HOSPITAL_BASED_OUTPATIENT_CLINIC_OR_DEPARTMENT_OTHER): Payer: Medicare Other | Admitting: Internal Medicine

## 2016-08-09 ENCOUNTER — Ambulatory Visit: Payer: Medicare Other

## 2016-08-09 ENCOUNTER — Ambulatory Visit
Admission: RE | Admit: 2016-08-09 | Discharge: 2016-08-09 | Disposition: A | Discharge status: Home / Self Care | Payer: Medicare Other | Source: Ambulatory Visit | Attending: Radiation Oncology | Admitting: Radiation Oncology

## 2016-08-09 VITALS — BP 124/77 | HR 140 | Temp 98.1°F | Resp 18 | Wt 124.0 lb

## 2016-08-09 DIAGNOSIS — E559 Vitamin D deficiency, unspecified: Secondary | ICD-10-CM

## 2016-08-09 DIAGNOSIS — D510 Vitamin B12 deficiency anemia due to intrinsic factor deficiency: Secondary | ICD-10-CM | POA: Diagnosis not present

## 2016-08-09 DIAGNOSIS — C3412 Malignant neoplasm of upper lobe, left bronchus or lung: Secondary | ICD-10-CM

## 2016-08-09 DIAGNOSIS — K219 Gastro-esophageal reflux disease without esophagitis: Secondary | ICD-10-CM

## 2016-08-09 DIAGNOSIS — Z8711 Personal history of peptic ulcer disease: Secondary | ICD-10-CM | POA: Diagnosis not present

## 2016-08-09 DIAGNOSIS — J9811 Atelectasis: Secondary | ICD-10-CM

## 2016-08-09 DIAGNOSIS — I10 Essential (primary) hypertension: Secondary | ICD-10-CM | POA: Diagnosis not present

## 2016-08-09 DIAGNOSIS — M1990 Unspecified osteoarthritis, unspecified site: Secondary | ICD-10-CM

## 2016-08-09 DIAGNOSIS — Z510 Encounter for antineoplastic radiation therapy: Secondary | ICD-10-CM | POA: Diagnosis not present

## 2016-08-09 DIAGNOSIS — E039 Hypothyroidism, unspecified: Secondary | ICD-10-CM | POA: Diagnosis not present

## 2016-08-09 DIAGNOSIS — I129 Hypertensive chronic kidney disease with stage 1 through stage 4 chronic kidney disease, or unspecified chronic kidney disease: Secondary | ICD-10-CM | POA: Diagnosis not present

## 2016-08-09 DIAGNOSIS — Z79899 Other long term (current) drug therapy: Secondary | ICD-10-CM | POA: Diagnosis not present

## 2016-08-09 DIAGNOSIS — J449 Chronic obstructive pulmonary disease, unspecified: Secondary | ICD-10-CM | POA: Diagnosis not present

## 2016-08-09 DIAGNOSIS — D219 Benign neoplasm of connective and other soft tissue, unspecified: Secondary | ICD-10-CM

## 2016-08-09 DIAGNOSIS — Y842 Radiological procedure and radiotherapy as the cause of abnormal reaction of the patient, or of later complication, without mention of misadventure at the time of the procedure: Secondary | ICD-10-CM

## 2016-08-09 DIAGNOSIS — R5383 Other fatigue: Secondary | ICD-10-CM

## 2016-08-09 DIAGNOSIS — K5790 Diverticulosis of intestine, part unspecified, without perforation or abscess without bleeding: Secondary | ICD-10-CM | POA: Diagnosis not present

## 2016-08-09 DIAGNOSIS — E7800 Pure hypercholesterolemia, unspecified: Secondary | ICD-10-CM

## 2016-08-09 DIAGNOSIS — M818 Other osteoporosis without current pathological fracture: Secondary | ICD-10-CM

## 2016-08-09 DIAGNOSIS — D649 Anemia, unspecified: Secondary | ICD-10-CM | POA: Diagnosis not present

## 2016-08-09 DIAGNOSIS — Z803 Family history of malignant neoplasm of breast: Secondary | ICD-10-CM | POA: Diagnosis not present

## 2016-08-09 DIAGNOSIS — Z87891 Personal history of nicotine dependence: Secondary | ICD-10-CM

## 2016-08-09 DIAGNOSIS — R739 Hyperglycemia, unspecified: Secondary | ICD-10-CM | POA: Diagnosis not present

## 2016-08-09 DIAGNOSIS — N189 Chronic kidney disease, unspecified: Secondary | ICD-10-CM

## 2016-08-09 DIAGNOSIS — R1310 Dysphagia, unspecified: Secondary | ICD-10-CM | POA: Diagnosis not present

## 2016-08-09 DIAGNOSIS — K449 Diaphragmatic hernia without obstruction or gangrene: Secondary | ICD-10-CM | POA: Diagnosis not present

## 2016-08-09 DIAGNOSIS — J45909 Unspecified asthma, uncomplicated: Secondary | ICD-10-CM | POA: Diagnosis not present

## 2016-08-09 NOTE — Progress Notes (Signed)
Patient is here today for follow up, she mentions some chest tightness, she is also coughing a lot.

## 2016-08-09 NOTE — Progress Notes (Signed)
[place] NOTE  Patient Care Team: Lavera Guise, MD as PCP - General (Internal Medicine) Wellington Hampshire, MD as Consulting Physician (Cardiology)  CHIEF COMPLAINTS/PURPOSE OF CONSULTATION: Lung cancer  #  Oncology History   # SEP 2017- SQUAMOUS CELL CA [s/p Bronch Dr.S Khan];PET - LEFT UPPER LOBE MASS- left upper lung collpase; mediastinal LN; Contralateral Right nodules [x3];STAGE IV [contralateral lung nodules]   # PDL-1- 90% [Keytruda]  # Hx of smoking/ COPD/ CKD [creat ~ 1.5]     Primary cancer of left upper lobe of lung ([place])   07/06/2016 Initial Diagnosis    Primary cancer of left upper lobe of lung (H[facility])       HISTORY OF PRESENTING ILLNESS:  Alice Delgado 82 y.o.  female with newly diagnosed squamous cell lung cancer stage IV; currently is here to review further treatment options.She is nearly finished with her radiation oncology and will have her last treatment tomorrow. She continues to have worsening shortness of breath. She also has chronic cough. She has some mild dysphasia. She denies any fevers. She has no neurologic complaints. She denies any nausea, vomiting, constipation, or diarrhea. Patient otherwise feels well and offers no further specific complaints.  ROS: A complete 10 point review of system is done which is negative except mentioned above in history of present illness  MEDICAL HISTORY:  Past Medical History:  Diagnosis Date  . Asthma   . Diverticulosis   . GERD (gastroesophageal reflux disease)   . Hiatal hernia   . Hypercholesterolemia   . Hyperglycemia   . Hypertension   . Hypothyroidism   . Osteoarthritis    lumbar disc dz, cervical disc dz, hands  . Osteoporosis    intolerance to fosamax, evista and miacalcin  . Peptic ulcer disease   . Pernicious anemia   . Vitamin D deficiency     SURGICAL HISTORY: Past Surgical History:  Procedure Laterality Date  . ABDOMINAL HYSTERECTOMY     with benign ovarian tumor  removed.    Marland Kitchen FLEXIBLE BRONCHOSCOPY N/A 06/16/2016   Procedure: FLEXIBLE BRONCHOSCOPY;  Surgeon: Allyne Gee, MD;  Location: [facility] ORS;  Service: Pulmonary;  Laterality: N/A;  . 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 No alcohol. Social History   Social History  . Marital status: Widowed    Spouse name: N/A  . Number of children: 2  . Years of education: N/A   Occupational History  . Not on file.   Social History Main Topics  . Smoking status: Former Smoker    Years: 15.00    Types: Cigarettes  . Smokeless tobacco: Never Used  . Alcohol use No  . Drug use: No  . Sexual activity: Not on file   Other Topics Concern  . Not on file   Social History Narrative  . No narrative on file    FAMILY HISTORY: Family History  Problem Relation Age of Onset  . Heart disease Father     myocardial infarction  . Heart attack Father   . Cancer Mother     cancer of lymph nodes and kidney  . Breast cancer Sister   . Colon cancer Neg Hx     ALLERGIES:  is allergic to penicillins; augmentin [amoxicillin-pot clavulanate]; evista [raloxifene]; fosamax [alendronate sodium]; lipitor [atorvastatin]; miralax [polyethylene glycol]; and zetia [ezetimibe].  MEDICATIONS:  Current Outpatient Prescriptions  Medication Sig Dispense Refill  . albuterol (PROAIR  HFA) 108 (90 BASE) MCG/ACT inhaler Inhale 1 puff into the lungs every 6 (six) hours as needed for wheezing or shortness of breath.    Marland Kitchen amLODipine (NORVASC) 5 MG tablet Take 5 mg by mouth daily.    . Azelastine HCl 0.15 % SOLN Place into the nose as needed.    Marland Kitchen BREO ELLIPTA 100-25 MCG/INH AEPB INL 1 PUFF PO D  4  . cyanocobalamin (,VITAMIN B-12,) 1000 MCG/ML injection Inject 1 mL (1,000 mcg total) into the muscle every 30 (thirty) days. 10 mL 1  . esomeprazole (NEXIUM) 40 MG capsule Take 40 mg by mouth daily at 12 noon.    . hydrALAZINE (APRESOLINE) 50 MG tablet Take 1 tablet  (50 mg total) by mouth 3 (three) times daily. 90 tablet 5  . HYDROcodone-acetaminophen (VICODIN) 5-325 mg TABS tablet One tablet q day to bid prn 40 tablet 0  . ipratropium-albuterol (DUONEB) 0.5-2.5 (3) MG/3ML SOLN Take 3 mLs by nebulization as needed.    . montelukast (SINGULAIR) 10 MG tablet Take 10 mg by mouth at bedtime.    . nebivolol (BYSTOLIC) 5 MG tablet Take 5 mg by mouth daily.    . nitroGLYCERIN (NITROSTAT) 0.4 MG SL tablet Place 1 tablet (0.4 mg total) under the tongue every 5 (five) minutes as needed for chest pain. 25 tablet 2  . spironolactone (ALDACTONE) 25 MG tablet TAKE 1 TABLET BY MOUTH DAILY 30 tablet 3  . sucralfate (CARAFATE) 1 g tablet Take 1 tablet (1 g total) by mouth 3 (three) times daily before meals. 90 tablet 3   No current facility-administered medications for this visit.       Marland Kitchen  PHYSICAL EXAMINATION: ECOG PERFORMANCE STATUS: 1 - Symptomatic but completely ambulatory  Vitals:   08/09/16 1516  BP: 124/77  Pulse: (!) 140  Resp: 18  Temp: 98.1 F (36.7 C)   Filed Weights   08/09/16 1516  Weight: 124 lb (56.2 kg)    GENERAL: Well-nourished well-developed; Alert, no distress and comfortable. She is on 2 L of oxygen. EYES: no pallor or icterus OROPHARYNX: no thrush or ulceration; good dentition  NECK: supple, no masses felt LYMPH:  no palpable lymphadenopathy in the cervical, axillary or inguinal regions LUNGS: Decreased breath sounds on the left side compared to the right.  HEART/CVS: regular rate & rhythm and no murmurs; No lower extremity edema ABDOMEN: abdomen soft, non-tender and normal bowel sounds Musculoskeletal:no cyanosis of digits and no clubbing  PSYCH: alert & oriented x 3 with fluent speech NEURO: no focal motor/sensory deficits SKIN:  no rashes or significant lesions  LABORATORY DATA:  I have reviewed the data as listed Lab Results  Component Value Date   WBC 6.5 07/30/2016   HGB 11.8 (L) 07/30/2016   HCT 36.3 07/30/2016    MCV 81.1 07/30/2016   PLT 252 07/30/2016    Recent Labs  06/10/16 1128 07/12/16 1420  NA  --  131*  K  --  4.0  CL  --  99*  CO2  --  25  GLUCOSE  --  116*  BUN  --  18  CREATININE 1.48* 1.31*  CALCIUM  --  9.2  GFRNONAA 31* 36*  GFRAA 36* 42*  PROT  --  7.2  ALBUMIN  --  4.1  AST  --  16  ALT  --  8*  ALKPHOS  --  73  BILITOT  --  0.6    RADIOGRAPHIC STUDIES: I have personally reviewed the radiological images  as listed and agreed with the findings in the report. Mr Jeri Cos Wo Contrast  Result Date: 07/20/2016 CLINICAL DATA:  82 year old hypertensive female with lung cancer. Staging. Initial encounter. EXAM: MRI HEAD WITHOUT AND WITH CONTRAST TECHNIQUE: Multiplanar, multiecho pulse sequences of the brain and surrounding structures were obtained without and with intravenous contrast. CONTRAST:  15m MULTIHANCE GADOBENATE DIMEGLUMINE 529 MG/ML IV SOLN COMPARISON:  None. FINDINGS: Brain: Small area of restricted motion left cerebellum. Question small infarct versus nonenhancing metastatic lesion. No other evidence of acute infarct or intracranial metastatic disease. No intracranial hemorrhage. Moderate chronic microvascular changes. Mild global atrophy without hydrocephalus. Vascular: Major intracranial vascular structures are patent. Skull and upper cervical spine: Cervical spondylotic changes with spinal stenosis and mild cord flattening C2-3 and C3-4. Sinuses/Orbits: Post right lens replacement. No acute orbital abnormality. Minimal mucosal thickening ethmoid sinus air cells. Other: Negative IMPRESSION: Small area of restricted motion left cerebellum. Question small infarct versus nonenhancing metastatic lesion. No other evidence of acute infarct or intracranial metastatic disease. Moderate chronic microvascular changes. Mild global atrophy without hydrocephalus. Cervical spondylotic changes with spinal stenosis and mild cord flattening C2-3 and C3-4. Electronically Signed   By: SGenia DelM.D.   On: 07/20/2016 17:36    ASSESSMENT & PLAN:   # Squamous cell carcinoma of the left upper lobe with atelectasis left upper lung; and also contralateral lung nodules- stage IV lung cancer. Patient will complete radiation tomorrow. Given her declining performance status, patient wishes a break prior to initiating next treatment possibly with Keytruda. She has indicated that she may decline further treatment, but has agreed to return to clinic in 2 weeks for further evaluation and discussion of immunotherapy.   # hospice/palliative care was previously discussed- hold off referral at this time.  # fatigue-gensing is okay. Thyroid panel within normal limits.  #Shortness of breath, cough: Patient was offered Medrol Dosepak which she declined. Continue OTC treatments as indicated. #Dysphagia: Secondary to XRT. Patient has a prescription for Carafate. #Follow-up: See Dr. BRogue Bussingin 2 weeks for further evaluation and discussion of systemic therapy.  Approximately 30 minutes was spent in discussion of which greater than 50% was consultation.   All questions were answered. The patient knows to call the clinic with any problems, questions or concerns.     TLloyd Huger MD 08/09/2016 11:53 PM

## 2016-08-09 NOTE — Patient Instructions (Signed)
Pembrolizumab injection What is this medicine? PEMBROLIZUMAB (pem broe liz ue mab) is a monoclonal antibody. It is used to treat melanoma and non-small cell lung cancer. This medicine may be used for other purposes; ask your health care provider or pharmacist if you have questions. What should I tell my health care provider before I take this medicine? They need to know if you have any of these conditions: -diabetes -immune system problems -inflammatory bowel disease -liver disease -lung or breathing disease -lupus -an unusual or allergic reaction to pembrolizumab, other medicines, foods, dyes, or preservatives -pregnant or trying to get pregnant -breast-feeding How should I use this medicine? This medicine is for infusion into a vein. It is given by a health care professional in a hospital or clinic setting. A special MedGuide will be given to you before each treatment. Be sure to read this information carefully each time. Talk to your pediatrician regarding the use of this medicine in children. Special care may be needed. Overdosage: If you think you have taken too much of this medicine contact a poison control center or emergency room at once. NOTE: This medicine is only for you. Do not share this medicine with others. What if I miss a dose? It is important not to miss your dose. Call your doctor or health care professional if you are unable to keep an appointment. What may interact with this medicine? Interactions have not been studied. Give your health care provider a list of all the medicines, herbs, non-prescription drugs, or dietary supplements you use. Also tell them if you smoke, drink alcohol, or use illegal drugs. Some items may interact with your medicine. This list may not describe all possible interactions. Give your health care provider a list of all the medicines, herbs, non-prescription drugs, or dietary supplements you use. Also tell them if you smoke, drink alcohol, or  use illegal drugs. Some items may interact with your medicine. What should I watch for while using this medicine? Your condition will be monitored carefully while you are receiving this medicine. You may need blood work done while you are taking this medicine. Do not become pregnant while taking this medicine or for 4 months after stopping it. Women should inform their doctor if they wish to become pregnant or think they might be pregnant. There is a potential for serious side effects to an unborn child. Talk to your health care professional or pharmacist for more information. Do not breast-feed an infant while taking this medicine or for 4 months after the last dose. What side effects may I notice from receiving this medicine? Side effects that you should report to your doctor or health care professional as soon as possible: -allergic reactions like skin rash, itching or hives, swelling of the face, lips, or tongue -bloody or black, tarry stools -breathing problems -change in the amount of urine -changes in vision -chest pain -chills -dark urine -dizziness or feeling faint or lightheaded -fast or irregular heartbeat -fever -flushing -hair loss -muscle pain -muscle weakness -persistent headache -signs and symptoms of high blood sugar such as dizziness; dry mouth; dry skin; fruity breath; nausea; stomach pain; increased hunger or thirst; increased urination -signs and symptoms of liver injury like dark urine, light-colored stools, loss of appetite, nausea, right upper belly pain, yellowing of the eyes or skin -stomach pain -weight loss Side effects that usually do not require medical attention (Report these to your doctor or health care professional if they continue or are bothersome.):constipation -cough -diarrhea -joint pain -  tiredness This list may not describe all possible side effects. Call your doctor for medical advice about side effects. You may report side effects to FDA at  1-800-FDA-1088. Where should I keep my medicine? This drug is given in a hospital or clinic and will not be stored at home. NOTE: This sheet is a summary. It may not cover all possible information. If you have questions about this medicine, talk to your doctor, pharmacist, or health care provider.    2016, Elsevier/Gold Standard. (2014-12-03 17:24:19)

## 2016-08-10 ENCOUNTER — Inpatient Hospital Stay: Payer: Medicare Other

## 2016-08-10 ENCOUNTER — Ambulatory Visit
Admission: RE | Admit: 2016-08-10 | Discharge: 2016-08-10 | Disposition: A | Discharge status: Home / Self Care | Payer: Medicare Other | Source: Ambulatory Visit | Attending: Radiation Oncology | Admitting: Radiation Oncology

## 2016-08-10 DIAGNOSIS — K219 Gastro-esophageal reflux disease without esophagitis: Secondary | ICD-10-CM | POA: Diagnosis not present

## 2016-08-10 DIAGNOSIS — Z79899 Other long term (current) drug therapy: Secondary | ICD-10-CM | POA: Diagnosis not present

## 2016-08-10 DIAGNOSIS — R739 Hyperglycemia, unspecified: Secondary | ICD-10-CM | POA: Diagnosis not present

## 2016-08-10 DIAGNOSIS — Z510 Encounter for antineoplastic radiation therapy: Secondary | ICD-10-CM | POA: Diagnosis not present

## 2016-08-10 DIAGNOSIS — M818 Other osteoporosis without current pathological fracture: Secondary | ICD-10-CM | POA: Diagnosis not present

## 2016-08-10 DIAGNOSIS — I10 Essential (primary) hypertension: Secondary | ICD-10-CM | POA: Diagnosis not present

## 2016-08-10 DIAGNOSIS — D649 Anemia, unspecified: Secondary | ICD-10-CM | POA: Diagnosis not present

## 2016-08-10 DIAGNOSIS — M1990 Unspecified osteoarthritis, unspecified site: Secondary | ICD-10-CM | POA: Diagnosis not present

## 2016-08-10 DIAGNOSIS — E7800 Pure hypercholesterolemia, unspecified: Secondary | ICD-10-CM | POA: Diagnosis not present

## 2016-08-10 DIAGNOSIS — E559 Vitamin D deficiency, unspecified: Secondary | ICD-10-CM | POA: Diagnosis not present

## 2016-08-10 DIAGNOSIS — K449 Diaphragmatic hernia without obstruction or gangrene: Secondary | ICD-10-CM | POA: Diagnosis not present

## 2016-08-10 DIAGNOSIS — E039 Hypothyroidism, unspecified: Secondary | ICD-10-CM | POA: Diagnosis not present

## 2016-08-10 DIAGNOSIS — Z803 Family history of malignant neoplasm of breast: Secondary | ICD-10-CM | POA: Diagnosis not present

## 2016-08-10 DIAGNOSIS — J45909 Unspecified asthma, uncomplicated: Secondary | ICD-10-CM | POA: Diagnosis not present

## 2016-08-10 DIAGNOSIS — C3412 Malignant neoplasm of upper lobe, left bronchus or lung: Secondary | ICD-10-CM | POA: Diagnosis not present

## 2016-08-10 DIAGNOSIS — Z87891 Personal history of nicotine dependence: Secondary | ICD-10-CM | POA: Diagnosis not present

## 2016-08-10 DIAGNOSIS — K5790 Diverticulosis of intestine, part unspecified, without perforation or abscess without bleeding: Secondary | ICD-10-CM | POA: Diagnosis not present

## 2016-08-11 ENCOUNTER — Ambulatory Visit: Payer: Medicare Other

## 2016-08-13 DIAGNOSIS — J449 Chronic obstructive pulmonary disease, unspecified: Secondary | ICD-10-CM | POA: Diagnosis not present

## 2016-08-18 DIAGNOSIS — J449 Chronic obstructive pulmonary disease, unspecified: Secondary | ICD-10-CM | POA: Diagnosis not present

## 2016-08-20 ENCOUNTER — Telehealth: Payer: Self-pay | Admitting: *Deleted

## 2016-08-20 NOTE — Telephone Encounter (Signed)
Per Ana, use Hydrocortisone cream and if no better call back Monday.

## 2016-08-20 NOTE — Telephone Encounter (Signed)
I do not believe that Alice Delgado is a patient of mine.  I would appreciate it, if you could pass this along to the appropriate provider(s).  Thanks.

## 2016-08-20 NOTE — Telephone Encounter (Signed)
Requesting gas and food card Also has some bills she needs to be paid. Advised to bring bills in and that I would check into a gas and food voucher for her.   Reports that she has a rash in the radiation field, completed XRT 1 - 2 weeks ago. Itching and scratching it and causing scabs. Rash is bumpy and on both front and back on left side. Pleas advise

## 2016-08-23 ENCOUNTER — Inpatient Hospital Stay: Payer: Medicare Other | Attending: Internal Medicine | Admitting: Internal Medicine

## 2016-08-23 VITALS — BP 116/77 | HR 130 | Temp 98.3°F | Resp 18 | Wt 123.0 lb

## 2016-08-23 DIAGNOSIS — Y842 Radiological procedure and radiotherapy as the cause of abnormal reaction of the patient, or of later complication, without mention of misadventure at the time of the procedure: Secondary | ICD-10-CM | POA: Insufficient documentation

## 2016-08-23 DIAGNOSIS — K5900 Constipation, unspecified: Secondary | ICD-10-CM | POA: Insufficient documentation

## 2016-08-23 DIAGNOSIS — N189 Chronic kidney disease, unspecified: Secondary | ICD-10-CM

## 2016-08-23 DIAGNOSIS — J449 Chronic obstructive pulmonary disease, unspecified: Secondary | ICD-10-CM

## 2016-08-23 DIAGNOSIS — C3412 Malignant neoplasm of upper lobe, left bronchus or lung: Secondary | ICD-10-CM | POA: Diagnosis not present

## 2016-08-23 DIAGNOSIS — J90 Pleural effusion, not elsewhere classified: Secondary | ICD-10-CM | POA: Insufficient documentation

## 2016-08-23 DIAGNOSIS — J9811 Atelectasis: Secondary | ICD-10-CM | POA: Diagnosis not present

## 2016-08-23 DIAGNOSIS — Z803 Family history of malignant neoplasm of breast: Secondary | ICD-10-CM | POA: Insufficient documentation

## 2016-08-23 DIAGNOSIS — Z79899 Other long term (current) drug therapy: Secondary | ICD-10-CM | POA: Diagnosis not present

## 2016-08-23 DIAGNOSIS — R21 Rash and other nonspecific skin eruption: Secondary | ICD-10-CM

## 2016-08-23 DIAGNOSIS — Z7901 Long term (current) use of anticoagulants: Secondary | ICD-10-CM | POA: Insufficient documentation

## 2016-08-23 DIAGNOSIS — Z923 Personal history of irradiation: Secondary | ICD-10-CM | POA: Insufficient documentation

## 2016-08-23 DIAGNOSIS — I129 Hypertensive chronic kidney disease with stage 1 through stage 4 chronic kidney disease, or unspecified chronic kidney disease: Secondary | ICD-10-CM | POA: Insufficient documentation

## 2016-08-23 DIAGNOSIS — E039 Hypothyroidism, unspecified: Secondary | ICD-10-CM | POA: Diagnosis not present

## 2016-08-23 DIAGNOSIS — M1990 Unspecified osteoarthritis, unspecified site: Secondary | ICD-10-CM | POA: Insufficient documentation

## 2016-08-23 DIAGNOSIS — E559 Vitamin D deficiency, unspecified: Secondary | ICD-10-CM | POA: Insufficient documentation

## 2016-08-23 DIAGNOSIS — Z8051 Family history of malignant neoplasm of kidney: Secondary | ICD-10-CM | POA: Diagnosis not present

## 2016-08-23 DIAGNOSIS — Z87891 Personal history of nicotine dependence: Secondary | ICD-10-CM | POA: Insufficient documentation

## 2016-08-23 DIAGNOSIS — I700 Atherosclerosis of aorta: Secondary | ICD-10-CM | POA: Diagnosis not present

## 2016-08-23 DIAGNOSIS — Z800 Family history of malignant neoplasm of digestive organs: Secondary | ICD-10-CM | POA: Diagnosis not present

## 2016-08-23 DIAGNOSIS — E7800 Pure hypercholesterolemia, unspecified: Secondary | ICD-10-CM | POA: Insufficient documentation

## 2016-08-23 DIAGNOSIS — Z5112 Encounter for antineoplastic immunotherapy: Secondary | ICD-10-CM | POA: Diagnosis not present

## 2016-08-23 DIAGNOSIS — K219 Gastro-esophageal reflux disease without esophagitis: Secondary | ICD-10-CM | POA: Diagnosis not present

## 2016-08-23 DIAGNOSIS — D510 Vitamin B12 deficiency anemia due to intrinsic factor deficiency: Secondary | ICD-10-CM | POA: Diagnosis not present

## 2016-08-23 DIAGNOSIS — Z9981 Dependence on supplemental oxygen: Secondary | ICD-10-CM | POA: Diagnosis not present

## 2016-08-23 DIAGNOSIS — E871 Hypo-osmolality and hyponatremia: Secondary | ICD-10-CM | POA: Insufficient documentation

## 2016-08-23 MED ORDER — TRIAMCINOLONE ACETONIDE 0.5 % EX OINT: 1.0000 "application " | TOPICAL_OINTMENT | Freq: Two times a day (BID) | CUTANEOUS | 0 refills | Status: DC

## 2016-08-23 NOTE — Assessment & Plan Note (Addendum)
#  Squamous cell carcinoma of the left upper lobe with atelectasis left upper lung; and also contralateral lung nodules- stage IV lung cancer. S/p RT.   #  Systemic therapy- immunotherapy was again discussed; patient not too keen on systemic therapy. Discussed re: keytruda especially given PDL-1 -90%; good chance for response. Pt will decide on treatment based on the results of the CT scan.  # worsening SOB- recommend CT ASAP. She is also interested in pulmonary referral [facility]. We will await CT scan.  # constipation- fluids; miralax; stool softner.   # Skin rash- sec to RT; recommend aquaphor;   # Follow up few days after CT scan; keytruda infusion/ labs.

## 2016-08-23 NOTE — Progress Notes (Signed)
[place] NOTE  Patient Care Team: Lavera Guise, MD as PCP - General (Internal Medicine) Wellington Hampshire, MD as Consulting Physician (Cardiology)  CHIEF COMPLAINTS/PURPOSE OF CONSULTATION: Lung cancer  #  Oncology History   # SEP 2017- SQUAMOUS CELL CA [s/p Bronch Dr.S Khan];PET - LEFT UPPER LOBE MASS- left upper lung collpase; mediastinal LN; Contralateral Right nodules [x3];STAGE IV [contralateral lung nodules]   # PDL-1- 90% [Keytruda]  # Hx of smoking/ COPD/ CKD [creat ~ 1.5]     Primary cancer of left upper lobe of lung ([place])   07/06/2016 Initial Diagnosis    Primary cancer of left upper lobe of lung ([facility])       HISTORY OF PRESENTING ILLNESS:  Alice Delgado 82 y.o.  female with newly diagnosed squamous cell lung cancer stage IV; currently Is currently finished radiation.  Patient noted to have redness/rash posteriorly in the trunk; and anteriorly- within the radiation portal.   She is noted to have progressive shortness of breath leading up to 4 L of oxygen around-the-clock.  No significant hemoptysis. Positive for cough. No fevers-especially the mornings.  No bone pain. No headaches. No hoarseness of voice. Denies any unusual tingling and numbness. No worsening headaches.  ROS: A complete 10 point review of system is done which is negative except mentioned above in history of present illness  MEDICAL HISTORY:  Past Medical History:  Diagnosis Date  . Asthma   . Diverticulosis   . GERD (gastroesophageal reflux disease)   . Hiatal hernia   . Hypercholesterolemia   . Hyperglycemia   . Hypertension   . Hypothyroidism   . Osteoarthritis    lumbar disc dz, cervical disc dz, hands  . Osteoporosis    intolerance to fosamax, evista and miacalcin  . Peptic ulcer disease   . Pernicious anemia   . Vitamin D deficiency     SURGICAL HISTORY: Past Surgical History:  Procedure Laterality Date  . ABDOMINAL HYSTERECTOMY     with benign  ovarian tumor removed.    Marland Kitchen FLEXIBLE BRONCHOSCOPY N/A 06/16/2016   Procedure: FLEXIBLE BRONCHOSCOPY;  Surgeon: Allyne Gee, MD;  Location: [facility] ORS;  Service: Pulmonary;  Laterality: N/A;  . LUMBAR FUSION      SOCIAL HISTORY:Retired Network engineer. Lives in [place]- 2 daughters; quit smoking in 1985. No alcohol. Social History   Social History  . Marital status: Widowed    Spouse name: N/A  . Number of children: 2  . Years of education: N/A   Occupational History  . Not on file.   Social History Main Topics  . Smoking status: Former Smoker    Years: 15.00    Types: Cigarettes  . Smokeless tobacco: Never Used  . Alcohol use No  . Drug use: No  . Sexual activity: Not on file   Other Topics Concern  . Not on file   Social History Narrative  . No narrative on file    FAMILY HISTORY: Family History  Problem Relation Age of Onset  . Heart disease Father     myocardial infarction  . Heart attack Father   . Cancer Mother     cancer of lymph nodes and kidney  . Breast cancer Sister   . Colon cancer Neg Hx     ALLERGIES:  is allergic to penicillins; augmentin [amoxicillin-pot clavulanate]; evista [raloxifene]; fosamax [alendronate sodium]; lipitor [atorvastatin]; miralax [polyethylene glycol]; and zetia [ezetimibe].  MEDICATIONS:  Current Outpatient Prescriptions  Medication Sig Dispense Refill  .  albuterol (PROAIR HFA) 108 (90 BASE) MCG/ACT inhaler Inhale 1 puff into the lungs every 6 (six) hours as needed for wheezing or shortness of breath.    . Azelastine HCl 0.15 % SOLN Place into the nose as needed.    Marland Kitchen BREO ELLIPTA 100-25 MCG/INH AEPB INL 1 PUFF PO D  4  . cyanocobalamin (,VITAMIN B-12,) 1000 MCG/ML injection Inject 1 mL (1,000 mcg total) into the muscle every 30 (thirty) days. 10 mL 1  . esomeprazole (NEXIUM) 40 MG capsule Take 40 mg by mouth daily at 12 noon.    Marland Kitchen HYDROcodone-acetaminophen (VICODIN) 5-325 mg TABS tablet One tablet q day to bid prn 40 tablet 0  .  ipratropium-albuterol (DUONEB) 0.5-2.5 (3) MG/3ML SOLN Take 3 mLs by nebulization as needed.    . montelukast (SINGULAIR) 10 MG tablet Take 10 mg by mouth at bedtime.    . nebivolol (BYSTOLIC) 5 MG tablet Take 5 mg by mouth daily.    . nitroGLYCERIN (NITROSTAT) 0.4 MG SL tablet Place 1 tablet (0.4 mg total) under the tongue every 5 (five) minutes as needed for chest pain. 25 tablet 2  . spironolactone (ALDACTONE) 25 MG tablet TAKE 1 TABLET BY MOUTH DAILY 30 tablet 3  . sucralfate (CARAFATE) 1 g tablet Take 1 tablet (1 g total) by mouth 3 (three) times daily before meals. 90 tablet 3  . apixaban (ELIQUIS) 2.5 MG TABS tablet Take 1 tablet (2.5 mg total) by mouth 2 (two) times daily. 60 tablet 3  . diltiazem (CARDIZEM CD) 180 MG 24 hr capsule Take 1 capsule (180 mg total) by mouth daily. 30 capsule 3  . triamcinolone ointment (KENALOG) 0.5 % Apply 1 application topically 2 (two) times daily. 30 g 0   No current facility-administered medications for this visit.       Marland Kitchen  PHYSICAL EXAMINATION: ECOG PERFORMANCE STATUS: 1 - Symptomatic but completely ambulatory  Vitals:   08/23/16 1515  BP: 116/77  Pulse: (!) 130  Resp: 18  Temp: 98.3 F (36.8 C)   Filed Weights   08/23/16 1515  Weight: 123 lb (55.8 kg)    GENERAL: Well-nourished well-developed; Alert, no distress and comfortable.  With a daughters.She is in a wheelchair. She is on 4 L of oxygen. EYES: no pallor or icterus OROPHARYNX: no thrush or ulceration; good dentition  NECK: supple, no masses felt LYMPH:  no palpable lymphadenopathy in the cervical, axillary or inguinal regions LUNGS: Decreased breath sounds on the left side compared to the right.  HEART/CVS: regular rate & rhythm and no murmurs; No lower extremity edema ABDOMEN: abdomen soft, non-tender and normal bowel sounds Musculoskeletal:no cyanosis of digits and no clubbing  PSYCH: alert & oriented x 3 with fluent speech NEURO: no focal motor/sensory deficits SKIN:   Maculopapular rash noted in the posterior trunk/anterior chest wall- port of radiation  LABORATORY DATA:  I have reviewed the data as listed Lab Results  Component Value Date   WBC 6.5 07/30/2016   HGB 11.8 (L) 07/30/2016   HCT 36.3 07/30/2016   MCV 81.1 07/30/2016   PLT 252 07/30/2016    Recent Labs  06/10/16 1128 07/12/16 1420  NA  --  131*  K  --  4.0  CL  --  99*  CO2  --  25  GLUCOSE  --  116*  BUN  --  18  CREATININE 1.48* 1.31*  CALCIUM  --  9.2  GFRNONAA 31* 36*  GFRAA 36* 42*  PROT  --  7.2  ALBUMIN  --  4.1  AST  --  16  ALT  --  8*  ALKPHOS  --  73  BILITOT  --  0.6    RADIOGRAPHIC STUDIES: I have personally reviewed the radiological images as listed and agreed with the findings in the report. No results found.  ASSESSMENT & PLAN:   Primary cancer of left upper lobe of lung ([place]) # Squamous cell carcinoma of the left upper lobe with atelectasis left upper lung; and also contralateral lung nodules- stage IV lung cancer. S/p RT.   #  Systemic therapy- immunotherapy was again discussed; patient not too keen on systemic therapy. Discussed re: keytruda especially given PDL-1 -90%; good chance for response. Pt will decide on treatment based on the results of the CT scan.  # worsening SOB- recommend CT ASAP. She is also interested in pulmonary referral [facility]. We will await CT scan.  # constipation- fluids; miralax; stool softner.   # Skin rash- sec to RT; recommend aquaphor;   # Follow up few days after CT scan; keytruda infusion/ labs.    Cammie Sickle, MD 08/24/2016 5:57 PM

## 2016-08-23 NOTE — Progress Notes (Signed)
Patient is here for follow up, she would like you to look at rash, she has night sweats, constipation, weakness and fatigue. She has trouble sleeping, also asking if she needs to see another pulmonologist.

## 2016-08-24 ENCOUNTER — Telehealth: Payer: Self-pay | Admitting: Internal Medicine

## 2016-08-24 ENCOUNTER — Ambulatory Visit (INDEPENDENT_AMBULATORY_CARE_PROVIDER_SITE_OTHER): Payer: Medicare Other | Admitting: Cardiovascular Disease

## 2016-08-24 ENCOUNTER — Ambulatory Visit: Payer: Medicare Other | Admitting: Cardiovascular Disease

## 2016-08-24 ENCOUNTER — Encounter: Payer: Self-pay | Admitting: Cardiovascular Disease

## 2016-08-24 VITALS — BP 116/74 | HR 144 | Ht 61.0 in | Wt 122.5 lb

## 2016-08-24 DIAGNOSIS — R002 Palpitations: Secondary | ICD-10-CM | POA: Diagnosis not present

## 2016-08-24 DIAGNOSIS — I4892 Unspecified atrial flutter: Secondary | ICD-10-CM | POA: Diagnosis not present

## 2016-08-24 DIAGNOSIS — I10 Essential (primary) hypertension: Secondary | ICD-10-CM

## 2016-08-24 MED ORDER — APIXABAN 2.5 MG PO TABS: 2.5000 mg | ORAL_TABLET | Freq: Two times a day (BID) | ORAL | 3 refills | Status: DC

## 2016-08-24 MED ORDER — DILTIAZEM HCL ER COATED BEADS 180 MG PO CP24: 180.0000 mg | ORAL_CAPSULE | Freq: Every day | ORAL | 3 refills | Status: DC

## 2016-08-24 NOTE — Patient Instructions (Addendum)
Medication Instructions:  Your physician has recommended you make the following change in your medication:  STOP taking amlodipine STOP taking hydralazine START taking eliquis 2.'5mg'$  twice daily START taking diltiazam ER '180mg'$  once daily   Labwork: none  Testing/Procedures: Your physician has requested that you have an echocardiogram. Echocardiography is a painless test that uses sound waves to create images of your heart. It provides your doctor with information about the size and shape of your heart and how well your heart's chambers and valves are working. This procedure takes approximately one hour. There are no restrictions for this procedure.    Follow-Up: Your physician recommends that you schedule a follow-up appointment with Dr. Fletcher Anon after echo appt   Any Other Special Instructions Will Be Listed Below (If Applicable).     If you need a refill on your cardiac medications before your next appointment, please call your pharmacy.  Echocardiogram An echocardiogram, or echocardiography, uses sound waves (ultrasound) to produce an image of your heart. The echocardiogram is simple, painless, obtained within a short period of time, and offers valuable information to your health care provider. The images from an echocardiogram can provide information such as:  Evidence of coronary artery disease (CAD).  Heart size.  Heart muscle function.  Heart valve function.  Aneurysm detection.  Evidence of a past heart attack.  Fluid buildup around the heart.  Heart muscle thickening.  Assess heart valve function. LET [facility] CARE PROVIDER KNOW ABOUT:  Any allergies you have.  All medicines you are taking, including vitamins, herbs, eye drops, creams, and over-the-counter medicines.  Previous problems you or members of your family have had with the use of anesthetics.  Any blood disorders you have.  Previous surgeries you have had.  Medical conditions you  have.  Possibility of pregnancy, if this applies. BEFORE THE PROCEDURE  No special preparation is needed. Eat and drink normally.  PROCEDURE   In order to produce an image of your heart, gel will be applied to your chest and a wand-like tool (transducer) will be moved over your chest. The gel will help transmit the sound waves from the transducer. The sound waves will harmlessly bounce off your heart to allow the heart images to be captured in real-time motion. These images will then be recorded.  You may need an IV to receive a medicine that improves the quality of the pictures. AFTER THE PROCEDURE You may return to your normal schedule including diet, activities, and medicines, unless your health care provider tells you otherwise.   This information is not intended to replace advice given to you by your health care provider. Make sure you discuss any questions you have with your health care provider.   Document Released: 10/01/2000 Document Revised: 10/25/2014 Document Reviewed: 06/11/2013 Elsevier Interactive Patient Education [employer].

## 2016-08-24 NOTE — Progress Notes (Signed)
Cardiology Office Note   Date:  08/24/2016   ID:  Alice Delgado, DOB 1929/04/22, MRN [id number]  PCP:  Alice Guise, MD  Cardiologist:   Kathlyn Sacramento, MD   Chief Complaint  Patient presents with  . other    C/o rapid heart rate and sob. Meds reviewed verbally with pt.      History of Present Illness: Alice Delgado is a 82 y.o. female who presents for evaluation of tachycardia. She has been followed by me for refractory hypertension and pulmonary hypertension on echocardiogram. She has chronic medical conditions that include hypertension, hyperlipidemia, chronic back pain, hypothyroidism and asthma.  she reports prolonged history of hypertension which has been difficult to control recently mostly due to intolerance to multiple medications.  ACE inhibitors/ARB caused cough. Hydrochlorothiazide caused hyponatremia and hypokalemia. Hydralazine worsened lower extremity edema.  Renal artery duplex  showed no evidence of renal artery stenosis.  Echocardiogram in April 2017 showed normal LV systolic function, grade 1 diastolic dysfunction, mild mitral regurgitation, moderate tricuspid regurgitation with moderate to severe pulmonary hypertension with an estimated systolic pulmonary pressure of 63 mmHg with moderate right ventricular hypertrophy. She was most recently seen in July and was in normal sinus rhythm. Since her last visit, she was diagnosed with stage IV squamous cell cancer of the lung. She received radiation therapy and might be undergoing chemotherapy in the near future. She developed tachycardia which according to family member probably started in August. Her heart rate has been ranging from 140-150 bpm. She has experienced gradual increase in shortness of breath leading to the need for oxygen therapy. Her blood pressure has been running low compared to have average.   Past Medical History:  Diagnosis Date  . Asthma   . Diverticulosis   . GERD (gastroesophageal  reflux disease)   . Hiatal hernia   . Hypercholesterolemia   . Hyperglycemia   . Hypertension   . Hypothyroidism   . Osteoarthritis    lumbar disc dz, cervical disc dz, hands  . Osteoporosis    intolerance to fosamax, evista and miacalcin  . Peptic ulcer disease   . Pernicious anemia   . Vitamin D deficiency     Past Surgical History:  Procedure Laterality Date  . ABDOMINAL HYSTERECTOMY     with benign ovarian tumor removed.    Marland Kitchen FLEXIBLE BRONCHOSCOPY N/A 06/16/2016   Procedure: FLEXIBLE BRONCHOSCOPY;  Surgeon: Allyne Gee, MD;  Location: [facility] ORS;  Service: Pulmonary;  Laterality: N/A;  . LUMBAR FUSION       Current Outpatient Prescriptions  Medication Sig Dispense Refill  . albuterol (PROAIR HFA) 108 (90 BASE) MCG/ACT inhaler Inhale 1 puff into the lungs every 6 (six) hours as needed for wheezing or shortness of breath.    Marland Kitchen amLODipine (NORVASC) 5 MG tablet Take 5 mg by mouth daily.    . Azelastine HCl 0.15 % SOLN Place into the nose as needed.    Marland Kitchen BREO ELLIPTA 100-25 MCG/INH AEPB INL 1 PUFF PO D  4  . cyanocobalamin (,VITAMIN B-12,) 1000 MCG/ML injection Inject 1 mL (1,000 mcg total) into the muscle every 30 (thirty) days. 10 mL 1  . esomeprazole (NEXIUM) 40 MG capsule Take 40 mg by mouth daily at 12 noon.    . hydrALAZINE (APRESOLINE) 50 MG tablet Take 1 tablet (50 mg total) by mouth 3 (three) times daily. 90 tablet 5  . HYDROcodone-acetaminophen (VICODIN) 5-325 mg TABS tablet One tablet q day to bid prn  40 tablet 0  . ipratropium-albuterol (DUONEB) 0.5-2.5 (3) MG/3ML SOLN Take 3 mLs by nebulization as needed.    . montelukast (SINGULAIR) 10 MG tablet Take 10 mg by mouth at bedtime.    . nebivolol (BYSTOLIC) 5 MG tablet Take 5 mg by mouth daily.    . nitroGLYCERIN (NITROSTAT) 0.4 MG SL tablet Place 1 tablet (0.4 mg total) under the tongue every 5 (five) minutes as needed for chest pain. 25 tablet 2  . spironolactone (ALDACTONE) 25 MG tablet TAKE 1 TABLET BY MOUTH DAILY 30  tablet 3  . sucralfate (CARAFATE) 1 g tablet Take 1 tablet (1 g total) by mouth 3 (three) times daily before meals. 90 tablet 3  . triamcinolone ointment (KENALOG) 0.5 % Apply 1 application topically 2 (two) times daily. 30 g 0   No current facility-administered medications for this visit.     Allergies:   Penicillins; Augmentin [amoxicillin-pot clavulanate]; Evista [raloxifene]; Fosamax [alendronate sodium]; Lipitor [atorvastatin]; Miralax [polyethylene glycol]; and Zetia [ezetimibe]    Social History:  The patient  reports that she has quit smoking. Her smoking use included Cigarettes. She quit after 15.00 years of use. She has never used smokeless tobacco. She reports that she does not drink alcohol or use drugs.   Family History:  The patient's family history includes Breast cancer in her sister; Cancer in her mother; Heart attack in her father; Heart disease in her father.    ROS:  Please see the history of present illness.   Otherwise, review of systems are positive for none.   All other systems are reviewed and negative.    PHYSICAL EXAM: VS:  BP 116/74   Pulse (!) 144   Ht '5\' 1"'$  (1.549 m)   Wt 122 lb 8 oz (55.6 kg)   BMI 23.15 kg/m  , BMI Body mass index is 23.15 kg/m. GEN: Well nourished, well developed, in no acute distress  HEENT: normal  Neck: no JVD, carotid bruits, or masses Cardiac: RRR and tachycardic; no murmurs, rubs, or gallops,no edema  Respiratory:  clear to auscultation bilaterally, normal work of breathing GI: soft, nontender, nondistended, + BS MS: no deformity or atrophy  Skin: warm and dry, no rash Neuro:  Strength and sensation are intact Psych: euthymic mood, full affect   EKG:  EKG is  ordered today. The EKG showed atrial flutter with 2-1 AV block and a heart rate 145 bpm.  Recent Labs: 07/12/2016: ALT 8; BUN 18; Creatinine, Ser 1.31; Potassium 4.0; Sodium 131 07/22/2016: TSH 4.450 07/30/2016: Hemoglobin 11.8; Platelets 252    Lipid Panel      Component Value Date/Time   CHOL 202 (H) 11/19/2013 1108   TRIG 199.0 (H) 11/19/2013 1108   HDL 44.40 11/19/2013 1108   CHOLHDL 5 11/19/2013 1108   VLDL 39.8 11/19/2013 1108   LDLCALC 122 (H) 05/29/2013 0811   LDLDIRECT 131.1 11/19/2013 1108      Wt Readings from Last 3 Encounters:  08/24/16 122 lb 8 oz (55.6 kg)  08/23/16 123 lb (55.8 kg)  08/09/16 124 lb (56.2 kg)      Other studies Reviewed: Additional studies/ records that were reviewed today include:  Oncology notes regarding recent diagnosis of lung cancer  ASSESSMENT AND PLAN:  1. Atrial flutter with rapid ventricular response: This is a new diagnosis and it looks like she has been in this since August. She previously had bradycardia. We need to control her ventricular rate. Thus, I discontinued amlodipine and hydralazine and started her  on diltiazem extended release 180 mg once daily. CHADS VASc score is 4. Thus, I elected to start anticoagulation with low dose Eliquis (given age and weight). If she goes on chemotherapy, we have to closely monitor her hemoglobin and platelet count.  2. Exertional dyspnea: This has worsened significantly over the last few months which certainly could be due to her lung cancer. However, she is at risk for tachycardia-induced cardiomyopathy. I requested an echocardiogram for evaluation.  3. Essential hypertension:  Blood pressure has been running low could be due to tachycardia. I discontinued hydralazine and amlodipine.     Disposition:   FU with me in 1 month  Signed,  Kathlyn Sacramento, MD  08/24/2016 3:24 PM    [facility]

## 2016-08-24 NOTE — Telephone Encounter (Signed)
She said her mom has great difficulty going to any appointments before 12 noon because of her breathing issues and anxiety. She wants to know if they can move her appt on 13th to after lunch. Dr. Sharmaine Base schedule is full so we would need to double-book or move someone. Please advise.

## 2016-08-24 NOTE — Telephone Encounter (Signed)
Ok to sch. @ 130pm

## 2016-08-25 ENCOUNTER — Ambulatory Visit: Payer: Medicare Other

## 2016-08-25 ENCOUNTER — Telehealth: Payer: Self-pay | Admitting: *Deleted

## 2016-08-25 NOTE — Telephone Encounter (Signed)
Pt required PA for Eliquis 2.5 mg tablet. PA has been approved through [employer]  08/25/16-10/17/17 PA #[id number]

## 2016-08-26 ENCOUNTER — Telehealth: Payer: Self-pay | Admitting: Internal Medicine

## 2016-08-26 ENCOUNTER — Ambulatory Visit
Admission: RE | Admit: 2016-08-26 | Discharge: 2016-08-26 | Disposition: A | Discharge status: Home / Self Care | Payer: Medicare Other | Source: Ambulatory Visit | Attending: Internal Medicine | Admitting: Internal Medicine

## 2016-08-26 DIAGNOSIS — I700 Atherosclerosis of aorta: Secondary | ICD-10-CM | POA: Diagnosis not present

## 2016-08-26 DIAGNOSIS — I2510 Atherosclerotic heart disease of native coronary artery without angina pectoris: Secondary | ICD-10-CM | POA: Diagnosis not present

## 2016-08-26 DIAGNOSIS — J90 Pleural effusion, not elsewhere classified: Secondary | ICD-10-CM | POA: Diagnosis not present

## 2016-08-26 DIAGNOSIS — C3412 Malignant neoplasm of upper lobe, left bronchus or lung: Secondary | ICD-10-CM | POA: Insufficient documentation

## 2016-08-26 DIAGNOSIS — R918 Other nonspecific abnormal finding of lung field: Secondary | ICD-10-CM | POA: Diagnosis not present

## 2016-08-26 NOTE — Telephone Encounter (Signed)
Spoke to the patient regarding the results of the CT scan- recommend ultrasound guided thoracentesis. Also reviwed the note from cardiology.   # Instructed to hold Eliquis- tonight; and tomorrow morning.  # Plan left-sided ultrasound-guided thoracentesis; orders in.   Heather- please schedule for Friday 11/10; and INFORM Pt. Thx

## 2016-08-27 ENCOUNTER — Telehealth: Payer: Self-pay | Admitting: Internal Medicine

## 2016-08-27 ENCOUNTER — Telehealth: Payer: Self-pay | Admitting: Cardiovascular Disease

## 2016-08-27 ENCOUNTER — Ambulatory Visit
Admission: RE | Admit: 2016-08-27 | Discharge: 2016-08-27 | Disposition: A | Discharge status: Home / Self Care | Payer: Medicare Other | Source: Ambulatory Visit | Attending: Internal Medicine | Admitting: Internal Medicine

## 2016-08-27 ENCOUNTER — Ambulatory Visit
Admission: RE | Admit: 2016-08-27 | Discharge: 2016-08-27 | Disposition: A | Discharge status: Home / Self Care | Payer: Medicare Other | Source: Ambulatory Visit | Attending: Interventional Radiology | Admitting: Interventional Radiology

## 2016-08-27 DIAGNOSIS — I700 Atherosclerosis of aorta: Secondary | ICD-10-CM | POA: Insufficient documentation

## 2016-08-27 DIAGNOSIS — R590 Localized enlarged lymph nodes: Secondary | ICD-10-CM | POA: Diagnosis not present

## 2016-08-27 DIAGNOSIS — R918 Other nonspecific abnormal finding of lung field: Secondary | ICD-10-CM | POA: Insufficient documentation

## 2016-08-27 DIAGNOSIS — Z9889 Other specified postprocedural states: Secondary | ICD-10-CM | POA: Diagnosis not present

## 2016-08-27 DIAGNOSIS — J90 Pleural effusion, not elsewhere classified: Secondary | ICD-10-CM

## 2016-08-27 NOTE — Telephone Encounter (Signed)
Decrease Cardizem ER to 120 mg once daily.

## 2016-08-27 NOTE — Procedures (Signed)
Successful US guided left sided thoracentesis yielding 500 L of serous pleural fluid.   EBL: None No immediate complications.  Ronny Bacon, MD Pager #: [phone]

## 2016-08-27 NOTE — Telephone Encounter (Signed)
Pt daughter calling stating  Pt was placed on 180 mg of Cardizem 1 a day  And patient has a sensitive stomach And now she is Taking something for naseua and xanax for the pain Wanted to see if we can start on a lower dose and work her way back up to the 180 mg  Please advise.

## 2016-08-27 NOTE — Telephone Encounter (Signed)
msg sent to scheduling team to sch. Thoracentesis today.

## 2016-08-27 NOTE — Telephone Encounter (Signed)
Spoke to patient's daughter; okay to resume eliquis tomorrow. Also discussed regarding symptoms/chest Ct scan results. Follow up as planned.

## 2016-08-27 NOTE — Telephone Encounter (Signed)
S/w pt daughter, Alice Delgado, who reports pt experienced nausea and abdominal pain after taking one dose of cardizem. Cardizem '180mg'$  and eliquis 2.'5mg'$  prescribed at 11/7 OV for aflutter.  She has taken eliquis as prescribed w/no difficulty. She did holdThursday PM and Friday AM dose d/t  procedure to remove fluid from her lung today.  Her first dose of cardizem was Thursday afternoon as there was difficulties with insurance approval. She has not taken it today and is beginning to feel a little better. Daughter would like to know if lower dose of cardizem could be prescribed then up titrate as tolerated. Forward to MD

## 2016-08-30 ENCOUNTER — Inpatient Hospital Stay (HOSPITAL_BASED_OUTPATIENT_CLINIC_OR_DEPARTMENT_OTHER): Payer: Medicare Other | Admitting: Internal Medicine

## 2016-08-30 ENCOUNTER — Telehealth: Payer: Self-pay | Admitting: *Deleted

## 2016-08-30 ENCOUNTER — Inpatient Hospital Stay: Payer: Medicare Other

## 2016-08-30 ENCOUNTER — Other Ambulatory Visit: Payer: Self-pay

## 2016-08-30 VITALS — BP 122/85 | HR 122 | Temp 98.4°F | Resp 18 | Wt 122.0 lb

## 2016-08-30 DIAGNOSIS — K5900 Constipation, unspecified: Secondary | ICD-10-CM | POA: Diagnosis not present

## 2016-08-30 DIAGNOSIS — K219 Gastro-esophageal reflux disease without esophagitis: Secondary | ICD-10-CM

## 2016-08-30 DIAGNOSIS — I700 Atherosclerosis of aorta: Secondary | ICD-10-CM | POA: Diagnosis not present

## 2016-08-30 DIAGNOSIS — I129 Hypertensive chronic kidney disease with stage 1 through stage 4 chronic kidney disease, or unspecified chronic kidney disease: Secondary | ICD-10-CM | POA: Diagnosis not present

## 2016-08-30 DIAGNOSIS — Z9981 Dependence on supplemental oxygen: Secondary | ICD-10-CM

## 2016-08-30 DIAGNOSIS — E871 Hypo-osmolality and hyponatremia: Secondary | ICD-10-CM

## 2016-08-30 DIAGNOSIS — Z79899 Other long term (current) drug therapy: Secondary | ICD-10-CM

## 2016-08-30 DIAGNOSIS — N189 Chronic kidney disease, unspecified: Secondary | ICD-10-CM | POA: Diagnosis not present

## 2016-08-30 DIAGNOSIS — C3412 Malignant neoplasm of upper lobe, left bronchus or lung: Secondary | ICD-10-CM

## 2016-08-30 DIAGNOSIS — E559 Vitamin D deficiency, unspecified: Secondary | ICD-10-CM | POA: Diagnosis not present

## 2016-08-30 DIAGNOSIS — E039 Hypothyroidism, unspecified: Secondary | ICD-10-CM | POA: Diagnosis not present

## 2016-08-30 DIAGNOSIS — J90 Pleural effusion, not elsewhere classified: Secondary | ICD-10-CM | POA: Diagnosis not present

## 2016-08-30 DIAGNOSIS — J449 Chronic obstructive pulmonary disease, unspecified: Secondary | ICD-10-CM

## 2016-08-30 DIAGNOSIS — D510 Vitamin B12 deficiency anemia due to intrinsic factor deficiency: Secondary | ICD-10-CM | POA: Diagnosis not present

## 2016-08-30 DIAGNOSIS — Z923 Personal history of irradiation: Secondary | ICD-10-CM | POA: Diagnosis not present

## 2016-08-30 DIAGNOSIS — Z87891 Personal history of nicotine dependence: Secondary | ICD-10-CM

## 2016-08-30 DIAGNOSIS — R21 Rash and other nonspecific skin eruption: Secondary | ICD-10-CM | POA: Diagnosis not present

## 2016-08-30 DIAGNOSIS — M1990 Unspecified osteoarthritis, unspecified site: Secondary | ICD-10-CM | POA: Diagnosis not present

## 2016-08-30 DIAGNOSIS — J9811 Atelectasis: Secondary | ICD-10-CM

## 2016-08-30 DIAGNOSIS — E7800 Pure hypercholesterolemia, unspecified: Secondary | ICD-10-CM | POA: Diagnosis not present

## 2016-08-30 DIAGNOSIS — Z5112 Encounter for antineoplastic immunotherapy: Secondary | ICD-10-CM | POA: Diagnosis not present

## 2016-08-30 DIAGNOSIS — Z800 Family history of malignant neoplasm of digestive organs: Secondary | ICD-10-CM | POA: Diagnosis not present

## 2016-08-30 DIAGNOSIS — Z7901 Long term (current) use of anticoagulants: Secondary | ICD-10-CM

## 2016-08-30 DIAGNOSIS — Z8051 Family history of malignant neoplasm of kidney: Secondary | ICD-10-CM | POA: Diagnosis not present

## 2016-08-30 LAB — COMPREHENSIVE METABOLIC PANEL
ALK PHOS: 70 U/L (ref 38–126)
ALT: 9 U/L — ABNORMAL LOW (ref 14–54)
ANION GAP: 8 (ref 5–15)
AST: 20 U/L (ref 15–41)
Albumin: 3.4 g/dL — ABNORMAL LOW (ref 3.5–5.0)
BILIRUBIN TOTAL: 0.4 mg/dL (ref 0.3–1.2)
BUN: 17 mg/dL (ref 6–20)
CALCIUM: 8.8 mg/dL — AB (ref 8.9–10.3)
CO2: 25 mmol/L (ref 22–32)
CREATININE: 1.32 mg/dL — AB (ref 0.44–1.00)
Chloride: 94 mmol/L — ABNORMAL LOW (ref 101–111)
GFR calc non Af Amer: 36 mL/min — ABNORMAL LOW (ref >60)
GFR, EST AFRICAN AMERICAN: 41 mL/min — AB (ref >60)
Glucose, Bld: 110 mg/dL — ABNORMAL HIGH (ref 65–99)
Potassium: 3.8 mmol/L (ref 3.5–5.1)
Sodium: 127 mmol/L — ABNORMAL LOW (ref 135–145)
TOTAL PROTEIN: 6.7 g/dL (ref 6.5–8.1)

## 2016-08-30 LAB — CBC WITH DIFFERENTIAL/PLATELET
Basophils Absolute: 0.1 10*3/uL (ref 0–0.1)
Basophils Relative: 1 %
Eosinophils Absolute: 0.2 10*3/uL (ref 0–0.7)
Eosinophils Relative: 4 %
HEMATOCRIT: 33.1 % — AB (ref 35.0–47.0)
HEMOGLOBIN: 11 g/dL — AB (ref 12.0–16.0)
LYMPHS ABS: 0.3 10*3/uL — AB (ref 1.0–3.6)
LYMPHS PCT: 5 %
MCH: 26 pg (ref 26.0–34.0)
MCHC: 33.1 g/dL (ref 32.0–36.0)
MCV: 78.5 fL — AB (ref 80.0–100.0)
MONOS PCT: 12 %
Monocytes Absolute: 0.6 10*3/uL (ref 0.2–0.9)
NEUTROS ABS: 3.8 10*3/uL (ref 1.4–6.5)
NEUTROS PCT: 78 %
Platelets: 215 10*3/uL (ref 150–440)
RBC: 4.22 MIL/uL (ref 3.80–5.20)
RDW: 14.8 % — ABNORMAL HIGH (ref 11.5–14.5)
WBC: 4.9 10*3/uL (ref 3.6–11.0)

## 2016-08-30 MED ORDER — DILTIAZEM HCL ER COATED BEADS 120 MG PO CP24: 120.0000 mg | ORAL_CAPSULE | Freq: Every day | ORAL | 0 refills | Status: DC

## 2016-08-30 MED ORDER — PEMBROLIZUMAB CHEMO INJECTION 100 MG/4ML
200.0000 mg | Freq: Once | INTRAVENOUS | Status: AC
  Administered 2016-08-30: 200 mg via INTRAVENOUS
  Filled 2016-08-30: qty 8

## 2016-08-30 MED ORDER — SODIUM CHLORIDE 0.9 % IV SOLN
Freq: Once | INTRAVENOUS | Status: AC
  Administered 2016-08-30: 15:00:00 via INTRAVENOUS
  Filled 2016-08-30: qty 1000

## 2016-08-30 NOTE — Progress Notes (Signed)
Patient is here for follow up  

## 2016-08-30 NOTE — Telephone Encounter (Signed)
S/w w/pt daughter, Cecille Rubin, who verbalized understanding of medication change. New prescription submitted to [facility], cardizem ER '120mg'$  qd

## 2016-08-30 NOTE — Progress Notes (Signed)
[facility] CONSULT NOTE  Patient Care Team: Fozia M Khan, MD as PCP - General (Internal Medicine) Muhammad A Arida, MD as Consulting Physician (Cardiology)  CHIEF COMPLAINTS/PURPOSE OF CONSULTATION: Lung cancer  #  Oncology History   # SEP 2017- SQUAMOUS CELL CA [s/p Bronch Dr.S Khan];PET - LEFT UPPER LOBE MASS- left upper lung collpase; mediastinal LN; Contralateral Right nodules [x3];STAGE IV [contralateral lung nodules]   # PDL-1- 90% [Keytruda]  # Hx of smoking/ COPD/ CKD [creat ~ 1.5]     Primary cancer of left upper lobe of lung (HCC)   07/06/2016 Initial Diagnosis    Primary cancer of left upper lobe of lung (HCC)       HISTORY OF PRESENTING ILLNESS:  Alice Delgado 82 y.o.  female with newly diagnosed squamous cell lung cancer stage IV; currently Is currently finished radiation; Here to review the results of her restaging CAT scan.  Patient had a thoracentesis of the left side with no significant improvement of her breathing. She continues to be on 3-4 L of oxygen  No significant hemoptysis. Positive for cough. No fevers-especially the mornings.  No bone pain. No headaches. No hoarseness of voice. Denies any unusual tingling and numbness. No worsening headaches.  ROS: A complete 10 point review of system is done which is negative except mentioned above in history of present illness  MEDICAL HISTORY:  Past Medical History:  Diagnosis Date  . Asthma   . Diverticulosis   . GERD (gastroesophageal reflux disease)   . Hiatal hernia   . Hypercholesterolemia   . Hyperglycemia   . Hypertension   . Hypothyroidism   . Osteoarthritis    lumbar disc dz, cervical disc dz, hands  . Osteoporosis    intolerance to fosamax, evista and miacalcin  . Peptic ulcer disease   . Pernicious anemia   . Vitamin D deficiency     SURGICAL HISTORY: Past Surgical History:  Procedure Laterality Date  . ABDOMINAL HYSTERECTOMY     with benign ovarian tumor removed.     . FLEXIBLE BRONCHOSCOPY N/A 06/16/2016   Procedure: FLEXIBLE BRONCHOSCOPY;  Surgeon: Saadat A Khan, MD;  Location: [facility] ORS;  Service: Pulmonary;  Laterality: N/A;  . LUMBAR FUSION      SOCIAL HISTORY:Retired secretary. Lives in [place]- 2 daughters; quit smoking in 1985. No alcohol. Social History   Social History  . Marital status: Widowed    Spouse name: N/A  . Number of children: 2  . Years of education: N/A   Occupational History  . Not on file.   Social History Main Topics  . Smoking status: Former Smoker    Years: 15.00    Types: Cigarettes  . Smokeless tobacco: Never Used  . Alcohol use No  . Drug use: No  . Sexual activity: Not on file   Other Topics Concern  . Not on file   Social History Narrative  . No narrative on file    FAMILY HISTORY: Family History  Problem Relation Age of Onset  . Heart disease Father     myocardial infarction  . Heart attack Father   . Cancer Mother     cancer of lymph nodes and kidney  . Breast cancer Sister   . Colon cancer Neg Hx     ALLERGIES:  is allergic to penicillins; augmentin [amoxicillin-pot clavulanate]; evista [raloxifene]; fosamax [alendronate sodium]; lipitor [atorvastatin]; miralax [polyethylene glycol]; and zetia [ezetimibe].  MEDICATIONS:  Current Outpatient Prescriptions  Medication Sig Dispense Refill  .   albuterol (PROAIR HFA) 108 (90 BASE) MCG/ACT inhaler Inhale 1 puff into the lungs every 6 (six) hours as needed for wheezing or shortness of breath.    Marland Kitchen apixaban (ELIQUIS) 2.5 MG TABS tablet Take 1 tablet (2.5 mg total) by mouth 2 (two) times daily. 60 tablet 3  . BREO ELLIPTA 100-25 MCG/INH AEPB INL 1 PUFF PO D  4  . cyanocobalamin (,VITAMIN B-12,) 1000 MCG/ML injection Inject 1 mL (1,000 mcg total) into the muscle every 30 (thirty) days. 10 mL 1  . diltiazem (CARDIZEM CD) 120 MG 24 hr capsule Take 1 capsule (120 mg total) by mouth daily. 30 capsule 0  . esomeprazole (NEXIUM) 40 MG capsule Take 40 mg by  mouth daily at 12 noon.    Marland Kitchen HYDROcodone-acetaminophen (VICODIN) 5-325 mg TABS tablet One tablet q day to bid prn 40 tablet 0  . ipratropium-albuterol (DUONEB) 0.5-2.5 (3) MG/3ML SOLN Take 3 mLs by nebulization as needed.    . montelukast (SINGULAIR) 10 MG tablet Take 10 mg by mouth at bedtime.    . nitroGLYCERIN (NITROSTAT) 0.4 MG SL tablet Place 1 tablet (0.4 mg total) under the tongue every 5 (five) minutes as needed for chest pain. 25 tablet 2  . spironolactone (ALDACTONE) 25 MG tablet TAKE 1 TABLET BY MOUTH DAILY 30 tablet 3  . triamcinolone ointment (KENALOG) 0.5 % Apply 1 application topically 2 (two) times daily. 30 g 0  . Azelastine HCl 0.15 % SOLN Place into the nose as needed.    . nebivolol (BYSTOLIC) 5 MG tablet Take 5 mg by mouth daily.    . sucralfate (CARAFATE) 1 g tablet Take 1 tablet (1 g total) by mouth 3 (three) times daily before meals. (Patient not taking: Reported on 08/30/2016) 90 tablet 3   No current facility-administered medications for this visit.       Marland Kitchen  PHYSICAL EXAMINATION: ECOG PERFORMANCE STATUS: 1 - Symptomatic but completely ambulatory  Vitals:   08/30/16 1342  BP: 122/85  Pulse: (!) 122  Resp: 18  Temp: 98.4 F (36.9 C)   Filed Weights   08/30/16 1342  Weight: 122 lb (55.3 kg)    GENERAL: Well-nourished well-developed; Alert, no distress and comfortable.  With a daughter.She is in a wheelchair. She is on 4 L of oxygen. EYES: no pallor or icterus OROPHARYNX: no thrush or ulceration; good dentition  NECK: supple, no masses felt LYMPH:  no palpable lymphadenopathy in the cervical, axillary or inguinal regions LUNGS: Decreased breath sounds on the left side compared to the right.  HEART/CVS: regular rate & rhythm and no murmurs; No lower extremity edema ABDOMEN: abdomen soft, non-tender and normal bowel sounds Musculoskeletal:no cyanosis of digits and no clubbing  PSYCH: alert & oriented x 3 with fluent speech NEURO: no focal motor/sensory  deficits  LABORATORY DATA:  I have reviewed the data as listed Lab Results  Component Value Date   WBC 4.9 08/30/2016   HGB 11.0 (L) 08/30/2016   HCT 33.1 (L) 08/30/2016   MCV 78.5 (L) 08/30/2016   PLT 215 08/30/2016    Recent Labs  06/10/16 1128 07/12/16 1420 08/30/16 1300  NA  --  131* 127*  K  --  4.0 3.8  CL  --  99* 94*  CO2  --  25 25  GLUCOSE  --  116* 110*  BUN  --  18 17  CREATININE 1.48* 1.31* 1.32*  CALCIUM  --  9.2 8.8*  GFRNONAA 31* 36* 36*  GFRAA 36* 42*  41*  PROT  --  7.2 6.7  ALBUMIN  --  4.1 3.4*  AST  --  16 20  ALT  --  8* 9*  ALKPHOS  --  73 70  BILITOT  --  0.6 0.4    RADIOGRAPHIC STUDIES: I have personally reviewed the radiological images as listed and agreed with the findings in the report. Dg Chest 1 View  Result Date: 08/27/2016 CLINICAL DATA:  Status post left-sided thoracentesis. EXAM: CHEST 1 VIEW COMPARISON:  Radiographs of June 08, 2016. CT scan of June 10, 2016. FINDINGS: Dextroscoliosis of thoracic spine is noted. Atherosclerosis of thoracic aorta is noted. No pneumothorax is noted. Increased left perihilar and basilar opacity is noted most consistent with worsening atelectasis or infiltrate secondary to left hilar adenopathy described on prior CT scan. Right lung is clear. Bony thorax is unremarkable. IMPRESSION: Aortic atherosclerosis. Increased left perihilar and basilar opacity is noted most consistent with worsening atelectasis or infiltrate secondary to left hilar adenopathy as noted on prior CT scan. Electronically Signed   By: Marijo Conception, M.D.   On: 08/27/2016 15:35   Ct Chest Wo Contrast  Result Date: 08/26/2016 CLINICAL DATA:  82 year old female with history of left-sided lung cancer status post radiation therapy (completed last week). No history of chemotherapy. Shortness of breath. EXAM: CT CHEST WITHOUT CONTRAST TECHNIQUE: Multidetector CT imaging of the chest was performed following the standard protocol without IV  contrast. COMPARISON:  PET-CT 06/14/2016.  Chest CT 06/10/2016. FINDINGS: Cardiovascular: Heart size is normal. Small amount of anterior pericardial fluid and/or thickening, unlikely to be of any hemodynamic significance at this time. No associated pericardial calcification. There is aortic atherosclerosis, as well as atherosclerosis of the great vessels of the mediastinum and the coronary arteries, including calcified atherosclerotic plaque in the left main, left anterior descending and right coronary arteries. Mediastinum/Nodes: No pathologically enlarged mediastinal lymph nodes. Fullness in the left hilar region inseparable from the previously demonstrated left upper lobe mass, which could partially represent left hilar lymphadenopathy. Esophagus is unremarkable in appearance. No axillary lymphadenopathy. Lungs/Pleura: Complete atelectasis of the left upper lobe centered around the previously diagnosed left upper lobe mass. This mass is difficult to discretely measure on today's noncontrast CT examination but appears likely slightly smaller than the prior examination estimated to measure approximately 6.0 x 6.1 cm (image 63 of series 2) as compared with 7.4 x 6.4 cm on prior study 06/06/2016. This mass continues to invade the mediastinum tracking along the anterior aspect of the left mainstem bronchus nearly to the level of the carina. Increasing moderate to large left-sided pleural effusion lying dependently. Enlargement of previously noted macrolobulated nodule with spiculated margins which currently measures 2.4 x 2.4 cm in the posterior aspect of the right upper lobe (image 42 of series 3). Adjacent right upper lobe nodule measuring 1.3 x 1.0 cm (image 52 of series 3) is similar to the prior examination, but appears to demonstrates some internal cavitation on today's study. There is also a very aggressive appearing 2.1 x 1.4 cm macrolobulated nodule with spiculated margins in the anterior aspect of the right  lower lobe abutting the major fissure (image 86 of series 3) which appears similar to the prior study, but also contains some internal cavitation on today's examination. No new nodules are noted. Some additional areas of bilateral apical nodular pleural parenchymal thickening are identified, similar to the prior study, likely areas of chronic scarring. No acute consolidative airspace disease. Upper Abdomen: Aortic atherosclerosis. Musculoskeletal: There  are no aggressive appearing lytic or blastic lesions noted in the visualized portions of the skeleton. IMPRESSION: 1. Today's study demonstrates a positive response to radiation therapy in the large left upper lobe perihilar mass. However, the previously noted left-sided pleural effusion has increased in size, and there has been some growth of one of the aggressive appearing right-sided pulmonary nodules in the right upper lobe near the apex. The other two previously noted right-sided nodules appear essentially unchanged compared to the prior study (with the exception of some interval development of internal cavitation). 2. Aortic atherosclerosis, in addition to left main and 3 vessel coronary artery disease. 3. Additional incidental findings, as above. Electronically Signed   By: Vinnie Langton M.D.   On: 08/26/2016 15:25   US Thoracentesis Asp Pleural Space W/img Guide  Result Date: 08/27/2016 INDICATION: Symptomatic left sided pleural effusion EXAM: US THORACENTESIS ASP PLEURAL SPACE W/IMG GUIDE COMPARISON:  Chest CT - 08/26/2016 MEDICATIONS: None. COMPLICATIONS: None immediate. TECHNIQUE: Informed written consent was obtained from the patient after a discussion of the risks, benefits and alternatives to treatment. A timeout was performed prior to the initiation of the procedure. Initial ultrasound scanning demonstrates a small to moderate-sized anechoic left-sided pleural effusion. The lower chest was prepped and draped in the usual sterile fashion. 1%  lidocaine was used for local anesthesia. An ultrasound image was saved for documentation purposes. An 8 Fr Safe-T-Centesis catheter was introduced. The thoracentesis was performed. The catheter was removed and a dressing was applied. The patient tolerated the procedure well without immediate post procedural complication. The patient was escorted to have an upright chest radiograph. FINDINGS: A total of approximately 450 cc of serous fluid was removed. IMPRESSION: Successful ultrasound-guided left sided thoracentesis yielding 450 cc of pleural fluid. Electronically Signed   By: Sandi Mariscal M.D.   On: 08/27/2016 16:13    ASSESSMENT & PLAN:   Primary cancer of left upper lobe of lung ([place]) # Squamous cell carcinoma of the left upper lobe with atelectasis left upper lung; and also contralateral lung nodules- stage IV lung cancer. S/p RT. November 2017 CT scan shows- improved left hilar mass; however increasing size of the contralateral lung lesions; increasing pleural effusion  # proceed with Systemic therapy- immunotherapy/ Keytruda #1 today.  was again discussed side effects/ mechasnim of action.   # Hyponatremia- ? Sec to [employer]; increasing salt intake. Sodium 127.  # worsening SOB- s/p thoracentesis- no significant improvement noted. Likely secondary to progressive malignancy atelectasis left upper lobe.   # Follow-up in 3 weeks cycle #2 of Keytruda/labs; ten-day labs  # # I reviewed the blood work- with the patient in detail; also reviewed the imaging independently [as summarized above]; and with the patient in detail.     Cammie Sickle, MD 08/30/2016 2:19 PM

## 2016-08-30 NOTE — Assessment & Plan Note (Addendum)
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 S/p RT. November 2017 CT scan shows- improved left hilar mass; however increasing size of the contralateral lung lesions; increasing pleural effusion  # proceed with Systemic therapy- immunotherapy/ Keytruda #1 today.  was again discussed side effects/ mechasnim of action.   # Hyponatremia- ? Sec to [employer]; increasing salt intake. Sodium 127.  # worsening SOB- s/p thoracentesis- no significant improvement noted. Likely secondary to progressive malignancy atelectasis left upper lobe.   # Follow-up in 3 weeks cycle #2 of Keytruda/labs; ten-day labs  # # I reviewed the blood work- with the patient in detail; also reviewed the imaging independently [as summarized above]; and with the patient in detail.

## 2016-08-30 NOTE — Telephone Encounter (Signed)
Patient made aware, spoke to her while in clinic ok to start back eliquis per Danley Danker.

## 2016-08-30 NOTE — Telephone Encounter (Signed)
Patient called wanting to know when she should resume her Eliquis. Message left on her machine to call back if needed.

## 2016-08-31 NOTE — Patient Instructions (Signed)
Pembrolizumab injection What is this medicine? PEMBROLIZUMAB (pem broe liz ue mab) is a monoclonal antibody. It is used to treat melanoma, head and neck cancer, Hodgkin lymphoma, and non-small cell lung cancer. COMMON BRAND NAME(S): Keytruda What should I tell my health care provider before I take this medicine? They need to know if you have any of these conditions: -diabetes -immune system problems -inflammatory bowel disease -liver disease -lung or breathing disease -lupus -organ transplant -an unusual or allergic reaction to pembrolizumab, other medicines, foods, dyes, or preservatives -pregnant or trying to get pregnant -breast-feeding How should I use this medicine? This medicine is for infusion into a vein. It is given by a health care professional in a hospital or clinic setting. A special MedGuide will be given to you before each treatment. Be sure to read this information carefully each time. Talk to your pediatrician regarding the use of this medicine in children. While this drug may be prescribed for selected conditions, precautions do apply. What if I miss a dose? It is important not to miss your dose. Call your doctor or health care professional if you are unable to keep an appointment. What may interact with this medicine? Interactions have not been studied. Give your health care provider a list of all the medicines, herbs, non-prescription drugs, or dietary supplements you use. Also tell them if you smoke, drink alcohol, or use illegal drugs. Some items may interact with your medicine. What should I watch for while using this medicine? Your condition will be monitored carefully while you are receiving this medicine. You may need blood work done while you are taking this medicine. Do not become pregnant while taking this medicine or for 4 months after stopping it. Women should inform their doctor if they wish to become pregnant or think they might be pregnant. There is a  potential for serious side effects to an unborn child. Talk to your health care professional or pharmacist for more information. Do not breast-feed an infant while taking this medicine or for 4 months after the last dose. What side effects may I notice from receiving this medicine? Side effects that you should report to your doctor or health care professional as soon as possible: -allergic reactions like skin rash, itching or hives, swelling of the face, lips, or tongue -bloody or black, tarry -breathing problems -changes in vision -chest pain -chills -constipation -cough -dizziness or feeling faint or lightheaded -fast or irregular heartbeat -fever -flushing -hair loss -low blood counts - this medicine may decrease the number of white blood cells, red blood cells and platelets. You may be at increased risk for infections and bleeding. -muscle pain -muscle weakness -persistent headache -signs and symptoms of high blood sugar such as dizziness; dry mouth; dry skin; fruity breath; nausea; stomach pain; increased hunger or thirst; increased urination -signs and symptoms of kidney injury like trouble passing urine or change in the amount of urine -signs and symptoms of liver injury like dark urine, light-colored stools, loss of appetite, nausea, right upper belly pain, yellowing of the eyes or skin -stomach pain -sweating -weight loss Side effects that usually do not require medical attention (report to your doctor or health care professional if they continue or are bothersome): -decreased appetite -diarrhea -tiredness Where should I keep my medicine? This drug is given in a hospital or clinic and will not be stored at home.  2017 Elsevier/Gold Standard (2016-01-05 19:28:44)

## 2016-09-02 ENCOUNTER — Inpatient Hospital Stay: Payer: Medicare Other

## 2016-09-03 ENCOUNTER — Telehealth: Payer: Self-pay | Admitting: *Deleted

## 2016-09-03 NOTE — Telephone Encounter (Signed)
Per Dr B, advise patient to go to the ER. I spoke with Claiborne Billings and she said she did noit think she will be able to get her to go, but she will try to convince her to go

## 2016-09-03 NOTE — Telephone Encounter (Signed)
Coughing frequently green sputum, Afebrile Feels like she cannot get air in so does not feel SVN is helping. HR 77-122 Sat 98% feels tight at sternum. Does not hear air movement in left lung, and  very little in right lung per daughter who is a CCU nurse.

## 2016-09-03 NOTE — Telephone Encounter (Signed)
-----   Message from Cephus Richer sent at 09/03/2016  2:31 PM EST ----- Contact: [phone] Pt not feeling well think she has bronchitis.

## 2016-09-08 ENCOUNTER — Inpatient Hospital Stay: Payer: Medicare Other

## 2016-09-08 DIAGNOSIS — R21 Rash and other nonspecific skin eruption: Secondary | ICD-10-CM | POA: Diagnosis not present

## 2016-09-08 DIAGNOSIS — E7800 Pure hypercholesterolemia, unspecified: Secondary | ICD-10-CM | POA: Diagnosis not present

## 2016-09-08 DIAGNOSIS — Z7901 Long term (current) use of anticoagulants: Secondary | ICD-10-CM | POA: Diagnosis not present

## 2016-09-08 DIAGNOSIS — K219 Gastro-esophageal reflux disease without esophagitis: Secondary | ICD-10-CM | POA: Diagnosis not present

## 2016-09-08 DIAGNOSIS — K5900 Constipation, unspecified: Secondary | ICD-10-CM | POA: Diagnosis not present

## 2016-09-08 DIAGNOSIS — J9811 Atelectasis: Secondary | ICD-10-CM | POA: Diagnosis not present

## 2016-09-08 DIAGNOSIS — Z8051 Family history of malignant neoplasm of kidney: Secondary | ICD-10-CM | POA: Diagnosis not present

## 2016-09-08 DIAGNOSIS — Z79899 Other long term (current) drug therapy: Secondary | ICD-10-CM | POA: Diagnosis not present

## 2016-09-08 DIAGNOSIS — J449 Chronic obstructive pulmonary disease, unspecified: Secondary | ICD-10-CM | POA: Diagnosis not present

## 2016-09-08 DIAGNOSIS — E039 Hypothyroidism, unspecified: Secondary | ICD-10-CM | POA: Diagnosis not present

## 2016-09-08 DIAGNOSIS — Z5112 Encounter for antineoplastic immunotherapy: Secondary | ICD-10-CM | POA: Diagnosis not present

## 2016-09-08 DIAGNOSIS — Z9981 Dependence on supplemental oxygen: Secondary | ICD-10-CM | POA: Diagnosis not present

## 2016-09-08 DIAGNOSIS — J90 Pleural effusion, not elsewhere classified: Secondary | ICD-10-CM | POA: Diagnosis not present

## 2016-09-08 DIAGNOSIS — Z87891 Personal history of nicotine dependence: Secondary | ICD-10-CM | POA: Diagnosis not present

## 2016-09-08 DIAGNOSIS — I700 Atherosclerosis of aorta: Secondary | ICD-10-CM | POA: Diagnosis not present

## 2016-09-08 DIAGNOSIS — Z800 Family history of malignant neoplasm of digestive organs: Secondary | ICD-10-CM | POA: Diagnosis not present

## 2016-09-08 DIAGNOSIS — Z923 Personal history of irradiation: Secondary | ICD-10-CM | POA: Diagnosis not present

## 2016-09-08 DIAGNOSIS — E559 Vitamin D deficiency, unspecified: Secondary | ICD-10-CM | POA: Diagnosis not present

## 2016-09-08 DIAGNOSIS — C3412 Malignant neoplasm of upper lobe, left bronchus or lung: Secondary | ICD-10-CM

## 2016-09-08 DIAGNOSIS — D510 Vitamin B12 deficiency anemia due to intrinsic factor deficiency: Secondary | ICD-10-CM | POA: Diagnosis not present

## 2016-09-08 DIAGNOSIS — N189 Chronic kidney disease, unspecified: Secondary | ICD-10-CM | POA: Diagnosis not present

## 2016-09-08 DIAGNOSIS — E871 Hypo-osmolality and hyponatremia: Secondary | ICD-10-CM | POA: Diagnosis not present

## 2016-09-08 DIAGNOSIS — M1990 Unspecified osteoarthritis, unspecified site: Secondary | ICD-10-CM | POA: Diagnosis not present

## 2016-09-08 DIAGNOSIS — I129 Hypertensive chronic kidney disease with stage 1 through stage 4 chronic kidney disease, or unspecified chronic kidney disease: Secondary | ICD-10-CM | POA: Diagnosis not present

## 2016-09-08 LAB — CBC WITH DIFFERENTIAL/PLATELET
BASOS ABS: 0 10*3/uL (ref 0–0.1)
BASOS PCT: 1 %
EOS PCT: 8 %
Eosinophils Absolute: 0.4 10*3/uL (ref 0–0.7)
HCT: 35 % (ref 35.0–47.0)
Hemoglobin: 11.6 g/dL — ABNORMAL LOW (ref 12.0–16.0)
Lymphocytes Relative: 6 %
Lymphs Abs: 0.3 10*3/uL — ABNORMAL LOW (ref 1.0–3.6)
MCH: 25.6 pg — ABNORMAL LOW (ref 26.0–34.0)
MCHC: 33 g/dL (ref 32.0–36.0)
MCV: 77.6 fL — AB (ref 80.0–100.0)
MONO ABS: 0.7 10*3/uL (ref 0.2–0.9)
Monocytes Relative: 15 %
Neutro Abs: 3.5 10*3/uL (ref 1.4–6.5)
Neutrophils Relative %: 70 %
PLATELETS: 244 10*3/uL (ref 150–440)
RBC: 4.51 MIL/uL (ref 3.80–5.20)
RDW: 14.6 % — AB (ref 11.5–14.5)
WBC: 4.9 10*3/uL (ref 3.6–11.0)

## 2016-09-08 LAB — BASIC METABOLIC PANEL
Anion gap: 10 (ref 5–15)
BUN: 21 mg/dL — AB (ref 6–20)
CHLORIDE: 92 mmol/L — AB (ref 101–111)
CO2: 24 mmol/L (ref 22–32)
Calcium: 8.9 mg/dL (ref 8.9–10.3)
Creatinine, Ser: 1.46 mg/dL — ABNORMAL HIGH (ref 0.44–1.00)
GFR calc Af Amer: 37 mL/min — ABNORMAL LOW (ref >60)
GFR calc non Af Amer: 32 mL/min — ABNORMAL LOW (ref >60)
Glucose, Bld: 106 mg/dL — ABNORMAL HIGH (ref 65–99)
POTASSIUM: 4 mmol/L (ref 3.5–5.1)
SODIUM: 126 mmol/L — AB (ref 135–145)

## 2016-09-13 ENCOUNTER — Ambulatory Visit
Admission: RE | Admit: 2016-09-13 | Discharge: 2016-09-13 | Disposition: A | Discharge status: Home / Self Care | Payer: Medicare Other | Source: Ambulatory Visit | Attending: Radiation Oncology | Admitting: Radiation Oncology

## 2016-09-13 ENCOUNTER — Encounter: Payer: Self-pay | Admitting: Radiation Oncology

## 2016-09-13 VITALS — BP 138/68 | HR 60 | Temp 98.6°F | Resp 22 | Wt 115.3 lb

## 2016-09-13 DIAGNOSIS — Z9981 Dependence on supplemental oxygen: Secondary | ICD-10-CM | POA: Diagnosis not present

## 2016-09-13 DIAGNOSIS — Z923 Personal history of irradiation: Secondary | ICD-10-CM | POA: Diagnosis not present

## 2016-09-13 DIAGNOSIS — J9811 Atelectasis: Secondary | ICD-10-CM | POA: Diagnosis not present

## 2016-09-13 DIAGNOSIS — C7801 Secondary malignant neoplasm of right lung: Secondary | ICD-10-CM | POA: Insufficient documentation

## 2016-09-13 DIAGNOSIS — C3412 Malignant neoplasm of upper lobe, left bronchus or lung: Secondary | ICD-10-CM | POA: Diagnosis not present

## 2016-09-13 DIAGNOSIS — J449 Chronic obstructive pulmonary disease, unspecified: Secondary | ICD-10-CM | POA: Diagnosis not present

## 2016-09-13 DIAGNOSIS — J90 Pleural effusion, not elsewhere classified: Secondary | ICD-10-CM | POA: Diagnosis not present

## 2016-09-13 NOTE — Progress Notes (Signed)
Radiation Oncology Follow up Note  Name: Alice Delgado   Date:   09/13/2016 MRN:  [id number] DOB: May 16, 1929    This 82 y.o. female presents to the clinic today for one-month follow-up status post radiation therapy for squamous cell carcinoma the left upper lobe with atelectasis.  REFERRING PROVIDER: Lavera Guise, MD  HPI: Patient is a 82 year old female now 1 month out having completed radiation therapy to her left upper lobe for atelectasis secondary to squamous cell carcinoma.. She also has known mediastinal adenopathy and contralateral right lung nodules. She recently had thoracentesis of her left side with no significant improvement of her breathing status. Repeat CT scan on December 9 showed positive response to radiation therapy with clearing of the atelectasis in her left upper lobe however there was increase in left-sided pleural effusion necessitating the thoracentesis. There is also some increased growth in the right upper lobe nodule near the apex although this is still less than 2 cm in size. She's quite frail still on nasal oxygen. She may need another thoracentesis according to her pulmonologist. She is currently on [place] and tolerating that well.  COMPLICATIONS OF TREATMENT: none  FOLLOW UP COMPLIANCE: keeps appointments   PHYSICAL EXAM:  BP 138/68   Pulse 60   Temp 98.6 F (37 C)   Resp (!) 22   Wt 115 lb 4.8 oz (52.3 kg)   BMI 21.79 kg/m  Frail elderly female in NAD. On nasal oxygen. Well-developed well-nourished patient in NAD. HEENT reveals PERLA, EOMI, discs not visualized.  Oral cavity is clear. No oral mucosal lesions are identified. Neck is clear without evidence of cervical or supraclavicular adenopathy. Lungs are clear to A&P. Cardiac examination is essentially unremarkable with regular rate and rhythm without murmur rub or thrill. Abdomen is benign with no organomegaly or masses noted. Motor sensory and DTR levels are equal and symmetric in the upper and  lower extremities. Cranial nerves II through XII are grossly intact. Proprioception is intact. No peripheral adenopathy or edema is identified. No motor or sensory levels are noted. Crude visual fields are within normal range.  RADIOLOGY RESULTS: Recent CT scan reviewed compatible above-stated findings  PLAN: At the present time she stable. Will leave it up to medical oncology decide whether she needs further thoracentesis although the pleural effusion does not look that significant. She is an excellent response to radiation the left upper lobe. And not back concerned about her right upper lobe nodule at this point although always can handle that with SB RT should that become progressive. I've asked to see her back in 4 months. We'll reevaluate her any time should medical oncology feel that is necessary. Patient and daughter both comprehend my treatment plan well.  I would like to take this opportunity to thank you for allowing me to participate in the care of your patient.Armstead Peaks., MD

## 2016-09-15 NOTE — Telephone Encounter (Signed)
Telephone encounter.

## 2016-09-17 DIAGNOSIS — J449 Chronic obstructive pulmonary disease, unspecified: Secondary | ICD-10-CM | POA: Diagnosis not present

## 2016-09-20 ENCOUNTER — Ambulatory Visit
Admission: RE | Admit: 2016-09-20 | Discharge: 2016-09-20 | Disposition: A | Discharge status: Home / Self Care | Payer: Medicare Other | Source: Ambulatory Visit | Attending: Internal Medicine | Admitting: Internal Medicine

## 2016-09-20 ENCOUNTER — Inpatient Hospital Stay: Payer: Medicare Other | Admitting: *Deleted

## 2016-09-20 ENCOUNTER — Other Ambulatory Visit: Payer: Self-pay

## 2016-09-20 ENCOUNTER — Inpatient Hospital Stay: Payer: Medicare Other

## 2016-09-20 ENCOUNTER — Inpatient Hospital Stay: Payer: Medicare Other | Attending: Internal Medicine

## 2016-09-20 ENCOUNTER — Other Ambulatory Visit: Payer: Self-pay | Admitting: Cardiovascular Disease

## 2016-09-20 VITALS — BP 124/76 | HR 112 | Temp 98.0°F | Wt 113.2 lb

## 2016-09-20 DIAGNOSIS — M1990 Unspecified osteoarthritis, unspecified site: Secondary | ICD-10-CM | POA: Diagnosis not present

## 2016-09-20 DIAGNOSIS — I129 Hypertensive chronic kidney disease with stage 1 through stage 4 chronic kidney disease, or unspecified chronic kidney disease: Secondary | ICD-10-CM | POA: Insufficient documentation

## 2016-09-20 DIAGNOSIS — R0602 Shortness of breath: Secondary | ICD-10-CM | POA: Diagnosis not present

## 2016-09-20 DIAGNOSIS — Z9981 Dependence on supplemental oxygen: Secondary | ICD-10-CM | POA: Insufficient documentation

## 2016-09-20 DIAGNOSIS — E878 Other disorders of electrolyte and fluid balance, not elsewhere classified: Secondary | ICD-10-CM | POA: Insufficient documentation

## 2016-09-20 DIAGNOSIS — E7800 Pure hypercholesterolemia, unspecified: Secondary | ICD-10-CM | POA: Diagnosis not present

## 2016-09-20 DIAGNOSIS — E871 Hypo-osmolality and hyponatremia: Secondary | ICD-10-CM | POA: Insufficient documentation

## 2016-09-20 DIAGNOSIS — E785 Hyperlipidemia, unspecified: Secondary | ICD-10-CM | POA: Insufficient documentation

## 2016-09-20 DIAGNOSIS — I700 Atherosclerosis of aorta: Secondary | ICD-10-CM | POA: Diagnosis not present

## 2016-09-20 DIAGNOSIS — Z5112 Encounter for antineoplastic immunotherapy: Secondary | ICD-10-CM | POA: Diagnosis not present

## 2016-09-20 DIAGNOSIS — C3412 Malignant neoplasm of upper lobe, left bronchus or lung: Secondary | ICD-10-CM

## 2016-09-20 DIAGNOSIS — I4892 Unspecified atrial flutter: Secondary | ICD-10-CM | POA: Diagnosis not present

## 2016-09-20 DIAGNOSIS — Z87891 Personal history of nicotine dependence: Secondary | ICD-10-CM | POA: Insufficient documentation

## 2016-09-20 DIAGNOSIS — K219 Gastro-esophageal reflux disease without esophagitis: Secondary | ICD-10-CM | POA: Insufficient documentation

## 2016-09-20 DIAGNOSIS — Z79899 Other long term (current) drug therapy: Secondary | ICD-10-CM | POA: Insufficient documentation

## 2016-09-20 DIAGNOSIS — J90 Pleural effusion, not elsewhere classified: Secondary | ICD-10-CM | POA: Diagnosis not present

## 2016-09-20 DIAGNOSIS — R591 Generalized enlarged lymph nodes: Secondary | ICD-10-CM | POA: Diagnosis not present

## 2016-09-20 DIAGNOSIS — Z993 Dependence on wheelchair: Secondary | ICD-10-CM | POA: Insufficient documentation

## 2016-09-20 DIAGNOSIS — R05 Cough: Secondary | ICD-10-CM | POA: Diagnosis not present

## 2016-09-20 DIAGNOSIS — D510 Vitamin B12 deficiency anemia due to intrinsic factor deficiency: Secondary | ICD-10-CM | POA: Diagnosis not present

## 2016-09-20 DIAGNOSIS — Z8719 Personal history of other diseases of the digestive system: Secondary | ICD-10-CM | POA: Insufficient documentation

## 2016-09-20 DIAGNOSIS — E559 Vitamin D deficiency, unspecified: Secondary | ICD-10-CM | POA: Diagnosis not present

## 2016-09-20 DIAGNOSIS — R739 Hyperglycemia, unspecified: Secondary | ICD-10-CM | POA: Insufficient documentation

## 2016-09-20 DIAGNOSIS — C7801 Secondary malignant neoplasm of right lung: Secondary | ICD-10-CM | POA: Diagnosis not present

## 2016-09-20 DIAGNOSIS — N189 Chronic kidney disease, unspecified: Secondary | ICD-10-CM | POA: Diagnosis not present

## 2016-09-20 DIAGNOSIS — K449 Diaphragmatic hernia without obstruction or gangrene: Secondary | ICD-10-CM | POA: Insufficient documentation

## 2016-09-20 DIAGNOSIS — M810 Age-related osteoporosis without current pathological fracture: Secondary | ICD-10-CM | POA: Diagnosis not present

## 2016-09-20 DIAGNOSIS — R0789 Other chest pain: Secondary | ICD-10-CM | POA: Insufficient documentation

## 2016-09-20 DIAGNOSIS — J449 Chronic obstructive pulmonary disease, unspecified: Secondary | ICD-10-CM | POA: Diagnosis not present

## 2016-09-20 DIAGNOSIS — E039 Hypothyroidism, unspecified: Secondary | ICD-10-CM | POA: Insufficient documentation

## 2016-09-20 DIAGNOSIS — Z8711 Personal history of peptic ulcer disease: Secondary | ICD-10-CM | POA: Insufficient documentation

## 2016-09-20 LAB — COMPREHENSIVE METABOLIC PANEL
ALBUMIN: 3.8 g/dL (ref 3.5–5.0)
ALK PHOS: 83 U/L (ref 38–126)
ALT: 11 U/L — AB (ref 14–54)
ANION GAP: 9 (ref 5–15)
AST: 23 U/L (ref 15–41)
BUN: 23 mg/dL — ABNORMAL HIGH (ref 6–20)
CALCIUM: 8.8 mg/dL — AB (ref 8.9–10.3)
CHLORIDE: 92 mmol/L — AB (ref 101–111)
CO2: 25 mmol/L (ref 22–32)
CREATININE: 1.39 mg/dL — AB (ref 0.44–1.00)
GFR calc non Af Amer: 34 mL/min — ABNORMAL LOW (ref >60)
GFR, EST AFRICAN AMERICAN: 39 mL/min — AB (ref >60)
GLUCOSE: 147 mg/dL — AB (ref 65–99)
Potassium: 3.7 mmol/L (ref 3.5–5.1)
SODIUM: 126 mmol/L — AB (ref 135–145)
Total Bilirubin: 0.7 mg/dL (ref 0.3–1.2)
Total Protein: 7.2 g/dL (ref 6.5–8.1)

## 2016-09-20 LAB — CBC WITH DIFFERENTIAL/PLATELET
BASOS ABS: 0 10*3/uL (ref 0–0.1)
BASOS PCT: 1 %
EOS ABS: 0.4 10*3/uL (ref 0–0.7)
EOS PCT: 8 %
HCT: 36.6 % (ref 35.0–47.0)
HEMOGLOBIN: 11.9 g/dL — AB (ref 12.0–16.0)
Lymphocytes Relative: 4 %
Lymphs Abs: 0.2 10*3/uL — ABNORMAL LOW (ref 1.0–3.6)
MCH: 25.2 pg — ABNORMAL LOW (ref 26.0–34.0)
MCHC: 32.6 g/dL (ref 32.0–36.0)
MCV: 77.4 fL — ABNORMAL LOW (ref 80.0–100.0)
Monocytes Absolute: 0.6 10*3/uL (ref 0.2–0.9)
Monocytes Relative: 11 %
NEUTROS PCT: 76 %
Neutro Abs: 4.2 10*3/uL (ref 1.4–6.5)
PLATELETS: 241 10*3/uL (ref 150–440)
RBC: 4.73 MIL/uL (ref 3.80–5.20)
RDW: 15.4 % — ABNORMAL HIGH (ref 11.5–14.5)
WBC: 5.5 10*3/uL (ref 3.6–11.0)

## 2016-09-20 MED ORDER — SODIUM CHLORIDE 0.9 % IV SOLN
Freq: Once | INTRAVENOUS | Status: AC
  Administered 2016-09-20: 15:00:00 via INTRAVENOUS
  Filled 2016-09-20: qty 1000

## 2016-09-20 MED ORDER — PEMBROLIZUMAB CHEMO INJECTION 100 MG/4ML
200.0000 mg | Freq: Once | INTRAVENOUS | Status: AC
  Administered 2016-09-20: 200 mg via INTRAVENOUS
  Filled 2016-09-20: qty 8

## 2016-09-20 NOTE — Progress Notes (Unsigned)
Patient states that she has dark urine, which has been going on for a long time.  Patient given oxygen tank while in clinic to conserve her own tank.  Patient given 4 liters of oxygen.

## 2016-09-20 NOTE — Progress Notes (Unsigned)
[facility] CONSULT NOTE  Patient Care Team: Fozia M Khan, MD as PCP - General (Internal Medicine) Muhammad A Arida, MD as Consulting Physician (Cardiology)  CHIEF COMPLAINTS/PURPOSE OF CONSULTATION: Lung cancer  #  Oncology History   # SEP 2017- SQUAMOUS CELL CA [s/p Bronch Dr.S Khan];PET - LEFT UPPER LOBE MASS- left upper lung collpase; mediastinal LN; Contralateral Right nodules [x3];STAGE IV [contralateral lung nodules]   # PDL-1- 90% [Keytruda]  # Hx of smoking/ COPD/ CKD [creat ~ 1.5]     Primary cancer of left upper lobe of lung (HCC)   07/06/2016 Initial Diagnosis    Primary cancer of left upper lobe of lung (HCC)       HISTORY OF PRESENTING ILLNESS:  Alice Delgado 82 y.o.  female with newly diagnosed squamous cell lung cancer stage IV; currently Is currently finished radiation; Here to review the results of her restaging CAT scan.  Patient had a thoracentesis of the left side with no significant improvement of her breathing. She continues to be on 3-4 L of oxygen  No significant hemoptysis. Positive for cough. No fevers-especially the mornings.  No bone pain. No headaches. No hoarseness of voice. Denies any unusual tingling and numbness. No worsening headaches.  ROS: A complete 10 point review of system is done which is negative except mentioned above in history of present illness  MEDICAL HISTORY:  Past Medical History:  Diagnosis Date  . Asthma   . Diverticulosis   . GERD (gastroesophageal reflux disease)   . Hiatal hernia   . Hypercholesterolemia   . Hyperglycemia   . Hypertension   . Hypothyroidism   . Osteoarthritis    lumbar disc dz, cervical disc dz, hands  . Osteoporosis    intolerance to fosamax, evista and miacalcin  . Peptic ulcer disease   . Pernicious anemia   . Vitamin D deficiency     SURGICAL HISTORY: Past Surgical History:  Procedure Laterality Date  . ABDOMINAL HYSTERECTOMY     with benign ovarian tumor removed.     . FLEXIBLE BRONCHOSCOPY N/A 06/16/2016   Procedure: FLEXIBLE BRONCHOSCOPY;  Surgeon: Saadat A Khan, MD;  Location: [facility] ORS;  Service: Pulmonary;  Laterality: N/A;  . LUMBAR FUSION      SOCIAL HISTORY:Retired secretary. Lives in [place]- 2 daughters; quit smoking in 1985. No alcohol. Social History   Social History  . Marital status: Widowed    Spouse name: N/A  . Number of children: 2  . Years of education: N/A   Occupational History  . Not on file.   Social History Main Topics  . Smoking status: Former Smoker    Years: 15.00    Types: Cigarettes  . Smokeless tobacco: Never Used  . Alcohol use No  . Drug use: No  . Sexual activity: Not on file   Other Topics Concern  . Not on file   Social History Narrative  . No narrative on file    FAMILY HISTORY: Family History  Problem Relation Age of Onset  . Heart disease Father     myocardial infarction  . Heart attack Father   . Cancer Mother     cancer of lymph nodes and kidney  . Breast cancer Sister   . Colon cancer Neg Hx     ALLERGIES:  is allergic to penicillins; augmentin [amoxicillin-pot clavulanate]; evista [raloxifene]; fosamax [alendronate sodium]; lipitor [atorvastatin]; miralax [polyethylene glycol]; and zetia [ezetimibe].  MEDICATIONS:  Current Outpatient Prescriptions  Medication Sig Dispense Refill  .   albuterol (PROAIR HFA) 108 (90 BASE) MCG/ACT inhaler Inhale 1 puff into the lungs every 6 (six) hours as needed for wheezing or shortness of breath.    Marland Kitchen apixaban (ELIQUIS) 2.5 MG TABS tablet Take 1 tablet (2.5 mg total) by mouth 2 (two) times daily. 60 tablet 3  . Azelastine HCl 0.15 % SOLN Place into the nose as needed.    Marland Kitchen BREO ELLIPTA 100-25 MCG/INH AEPB INL 1 PUFF PO D  4  . CARTIA XT 120 MG 24 hr capsule TAKE ONE CAPSULE BY MOUTH DAILY 30 capsule 3  . cyanocobalamin (,VITAMIN B-12,) 1000 MCG/ML injection Inject 1 mL (1,000 mcg total) into the muscle every 30 (thirty) days. 10 mL 1  . esomeprazole  (NEXIUM) 40 MG capsule Take 40 mg by mouth daily at 12 noon.    Marland Kitchen HYDROcodone-acetaminophen (VICODIN) 5-325 mg TABS tablet One tablet q day to bid prn 40 tablet 0  . ipratropium-albuterol (DUONEB) 0.5-2.5 (3) MG/3ML SOLN Take 3 mLs by nebulization as needed.    . montelukast (SINGULAIR) 10 MG tablet Take 10 mg by mouth at bedtime.    . nebivolol (BYSTOLIC) 5 MG tablet Take 5 mg by mouth daily.    . nitroGLYCERIN (NITROSTAT) 0.4 MG SL tablet Place 1 tablet (0.4 mg total) under the tongue every 5 (five) minutes as needed for chest pain. 25 tablet 2  . ondansetron (ZOFRAN) 4 MG tablet TK 1 T PO BID PRF NAUSEA  1  . spironolactone (ALDACTONE) 25 MG tablet TAKE 1 TABLET BY MOUTH DAILY 30 tablet 3  . sucralfate (CARAFATE) 1 g tablet Take 1 tablet (1 g total) by mouth 3 (three) times daily before meals. (Patient not taking: Reported on 09/13/2016) 90 tablet 3  . triamcinolone ointment (KENALOG) 0.5 % Apply 1 application topically 2 (two) times daily. 30 g 0   No current facility-administered medications for this visit.       Marland Kitchen  PHYSICAL EXAMINATION: ECOG PERFORMANCE STATUS: 1 - Symptomatic but completely ambulatory  There were no vitals filed for this visit. There were no vitals filed for this visit.  GENERAL: Well-nourished well-developed; Alert, no distress and comfortable.  With a daughter.She is in a wheelchair. She is on 4 L of oxygen. EYES: no pallor or icterus OROPHARYNX: no thrush or ulceration; good dentition  NECK: supple, no masses felt LYMPH:  no palpable lymphadenopathy in the cervical, axillary or inguinal regions LUNGS: Decreased breath sounds on the left side compared to the right.  HEART/CVS: regular rate & rhythm and no murmurs; No lower extremity edema ABDOMEN: abdomen soft, non-tender and normal bowel sounds Musculoskeletal:no cyanosis of digits and no clubbing  PSYCH: alert & oriented x 3 with fluent speech NEURO: no focal motor/sensory deficits  LABORATORY DATA:  I  have reviewed the data as listed Lab Results  Component Value Date   WBC 4.9 09/08/2016   HGB 11.6 (L) 09/08/2016   HCT 35.0 09/08/2016   MCV 77.6 (L) 09/08/2016   PLT 244 09/08/2016    Recent Labs  07/12/16 1420 08/30/16 1300 09/08/16 1408  NA 131* 127* 126*  K 4.0 3.8 4.0  CL 99* 94* 92*  CO2 '25 25 24  '$ GLUCOSE 116* 110* 106*  BUN 18 17 21*  CREATININE 1.31* 1.32* 1.46*  CALCIUM 9.2 8.8* 8.9  GFRNONAA 36* 36* 32*  GFRAA 42* 41* 37*  PROT 7.2 6.7  --   ALBUMIN 4.1 3.4*  --   AST 16 20  --   ALT  8* 9*  --   ALKPHOS 73 70  --   BILITOT 0.6 0.4  --     RADIOGRAPHIC STUDIES: I have personally reviewed the radiological images as listed and agreed with the findings in the report. Dg Chest 1 View  Result Date: 08/27/2016 CLINICAL DATA:  Status post left-sided thoracentesis. EXAM: CHEST 1 VIEW COMPARISON:  Radiographs of June 08, 2016. CT scan of June 10, 2016. FINDINGS: Dextroscoliosis of thoracic spine is noted. Atherosclerosis of thoracic aorta is noted. No pneumothorax is noted. Increased left perihilar and basilar opacity is noted most consistent with worsening atelectasis or infiltrate secondary to left hilar adenopathy described on prior CT scan. Right lung is clear. Bony thorax is unremarkable. IMPRESSION: Aortic atherosclerosis. Increased left perihilar and basilar opacity is noted most consistent with worsening atelectasis or infiltrate secondary to left hilar adenopathy as noted on prior CT scan. Electronically Signed   By: Marijo Conception, M.D.   On: 08/27/2016 15:35   Ct Chest Wo Contrast  Result Date: 08/26/2016 CLINICAL DATA:  82 year old female with history of left-sided lung cancer status post radiation therapy (completed last week). No history of chemotherapy. Shortness of breath. EXAM: CT CHEST WITHOUT CONTRAST TECHNIQUE: Multidetector CT imaging of the chest was performed following the standard protocol without IV contrast. COMPARISON:  PET-CT 06/14/2016.   Chest CT 06/10/2016. FINDINGS: Cardiovascular: Heart size is normal. Small amount of anterior pericardial fluid and/or thickening, unlikely to be of any hemodynamic significance at this time. No associated pericardial calcification. There is aortic atherosclerosis, as well as atherosclerosis of the great vessels of the mediastinum and the coronary arteries, including calcified atherosclerotic plaque in the left main, left anterior descending and right coronary arteries. Mediastinum/Nodes: No pathologically enlarged mediastinal lymph nodes. Fullness in the left hilar region inseparable from the previously demonstrated left upper lobe mass, which could partially represent left hilar lymphadenopathy. Esophagus is unremarkable in appearance. No axillary lymphadenopathy. Lungs/Pleura: Complete atelectasis of the left upper lobe centered around the previously diagnosed left upper lobe mass. This mass is difficult to discretely measure on today's noncontrast CT examination but appears likely slightly smaller than the prior examination estimated to measure approximately 6.0 x 6.1 cm (image 63 of series 2) as compared with 7.4 x 6.4 cm on prior study 06/06/2016. This mass continues to invade the mediastinum tracking along the anterior aspect of the left mainstem bronchus nearly to the level of the carina. Increasing moderate to large left-sided pleural effusion lying dependently. Enlargement of previously noted macrolobulated nodule with spiculated margins which currently measures 2.4 x 2.4 cm in the posterior aspect of the right upper lobe (image 42 of series 3). Adjacent right upper lobe nodule measuring 1.3 x 1.0 cm (image 52 of series 3) is similar to the prior examination, but appears to demonstrates some internal cavitation on today's study. There is also a very aggressive appearing 2.1 x 1.4 cm macrolobulated nodule with spiculated margins in the anterior aspect of the right lower lobe abutting the major fissure  (image 86 of series 3) which appears similar to the prior study, but also contains some internal cavitation on today's examination. No new nodules are noted. Some additional areas of bilateral apical nodular pleural parenchymal thickening are identified, similar to the prior study, likely areas of chronic scarring. No acute consolidative airspace disease. Upper Abdomen: Aortic atherosclerosis. Musculoskeletal: There are no aggressive appearing lytic or blastic lesions noted in the visualized portions of the skeleton. IMPRESSION: 1. Today's study demonstrates a positive response  to radiation therapy in the large left upper lobe perihilar mass. However, the previously noted left-sided pleural effusion has increased in size, and there has been some growth of one of the aggressive appearing right-sided pulmonary nodules in the right upper lobe near the apex. The other two previously noted right-sided nodules appear essentially unchanged compared to the prior study (with the exception of some interval development of internal cavitation). 2. Aortic atherosclerosis, in addition to left main and 3 vessel coronary artery disease. 3. Additional incidental findings, as above. Electronically Signed   By: Vinnie Langton M.D.   On: 08/26/2016 15:25   US Thoracentesis Asp Pleural Space W/img Guide  Result Date: 08/27/2016 INDICATION: Symptomatic left sided pleural effusion EXAM: US THORACENTESIS ASP PLEURAL SPACE W/IMG GUIDE COMPARISON:  Chest CT - 08/26/2016 MEDICATIONS: None. COMPLICATIONS: None immediate. TECHNIQUE: Informed written consent was obtained from the patient after a discussion of the risks, benefits and alternatives to treatment. A timeout was performed prior to the initiation of the procedure. Initial ultrasound scanning demonstrates a small to moderate-sized anechoic left-sided pleural effusion. The lower chest was prepped and draped in the usual sterile fashion. 1% lidocaine was used for local anesthesia.  An ultrasound image was saved for documentation purposes. An 8 Fr Safe-T-Centesis catheter was introduced. The thoracentesis was performed. The catheter was removed and a dressing was applied. The patient tolerated the procedure well without immediate post procedural complication. The patient was escorted to have an upright chest radiograph. FINDINGS: A total of approximately 450 cc of serous fluid was removed. IMPRESSION: Successful ultrasound-guided left sided thoracentesis yielding 450 cc of pleural fluid. Electronically Signed   By: Sandi Mariscal M.D.   On: 08/27/2016 16:13    ASSESSMENT & PLAN:   No problem-specific Assessment & Plan notes found for this encounter.    Cammie Sickle, MD 09/20/2016 1:30 PM

## 2016-09-20 NOTE — Assessment & Plan Note (Addendum)
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 S/p RT. November 2017 CT scan shows- improved left hilar mass; however increasing size of the contralateral lung lesions; increasing pleural effusion [s/p thora]  S/P keytruda cycle #1.   # Afib on eliquis. HOLD for thoracentesis x2 days; re-start   # Hyponatremia- ? Sec to [employer]; increasing salt intake. Sodium 126. .  # worsening SOB- s/p thoracentesis- no significant improvement noted. Likely secondary to progressive malignancy atelectasis left upper lobe.   # Follow-up in 3 weeks cycle #3 of Keytruda/labs;

## 2016-09-22 ENCOUNTER — Other Ambulatory Visit: Payer: Self-pay

## 2016-09-22 ENCOUNTER — Encounter: Payer: Self-pay | Admitting: Cardiology

## 2016-09-22 ENCOUNTER — Ambulatory Visit (INDEPENDENT_AMBULATORY_CARE_PROVIDER_SITE_OTHER): Payer: Medicare Other

## 2016-09-22 ENCOUNTER — Encounter: Payer: Self-pay | Admitting: Internal Medicine

## 2016-09-22 ENCOUNTER — Telehealth: Payer: Self-pay | Admitting: *Deleted

## 2016-09-22 DIAGNOSIS — I4892 Unspecified atrial flutter: Secondary | ICD-10-CM | POA: Diagnosis not present

## 2016-09-22 MED ORDER — DILTIAZEM HCL ER COATED BEADS 120 MG PO CP24: 120.0000 mg | ORAL_CAPSULE | Freq: Two times a day (BID) | ORAL | 3 refills | Status: DC

## 2016-09-22 NOTE — Telephone Encounter (Signed)
Spoke with daughter Cecille Rubin. Reassured daughter that thoracentesis not required as not sufficient pleural fluid to perform procedure.  Advised daughter to have pt resume eliquis

## 2016-09-22 NOTE — Progress Notes (Signed)
Echo completed.  HR was 140 bpm throughout exam.  Discussed with Dr. Saunders Revel (Echo reader), who further discussed with Dr. Fletcher Anon.

## 2016-09-22 NOTE — Telephone Encounter (Signed)
-----   Message from Cammie Sickle, MD sent at 09/22/2016  8:55 AM EST ----- Please inform pt that I spoke to radiology personally; not much fluid seen on the CXR; will go head and cancel thoracentesis. She can resume Elquis as ordered.

## 2016-09-22 NOTE — Progress Notes (Signed)
I was alerted by echo tech that patient was tachycardic with heart rate of ~140 bpm and BP ~110/60 (similar to clinic visit on 08/24/16).  She has noted mild swelling of her feet and fatigue at times, though she is unsure if this is related to heart rate or her ongoing lung cancer treatment.  She was started on apixaban on 08/24/16 but held at least 2 doses this week due to planned thoracentesis.  However, due to minimal fluid, thoracentesis was not performed.  Given continued tachycardia and incomplete anticoagulation over the last month, we will increase diltiazem from 120 mg daily to 120 mg BID (patient did not tolerate 180 mg daily due to GI upset).  The patient should restart apixaban 2.5 mg daily (dosed based on age and weight) and follow-up with Dr. Fletcher Anon next week as previously scheduled (09/27/16).  I will forward this to Dr. Fletcher Anon for his review and additional recommendations, as needed.  Nelva Bush, MD [facility] Pager: [phone]

## 2016-09-23 ENCOUNTER — Ambulatory Visit: Admission: RE | Admit: 2016-09-23 | Payer: Medicare Other | Source: Ambulatory Visit

## 2016-09-23 NOTE — Progress Notes (Signed)
Thanks!    Chris.

## 2016-09-27 ENCOUNTER — Ambulatory Visit: Payer: Medicare Other | Admitting: Cardiovascular Disease

## 2016-10-07 ENCOUNTER — Ambulatory Visit (INDEPENDENT_AMBULATORY_CARE_PROVIDER_SITE_OTHER): Payer: Medicare Other | Admitting: Cardiovascular Disease

## 2016-10-07 ENCOUNTER — Telehealth: Payer: Self-pay | Admitting: Cardiovascular Disease

## 2016-10-07 ENCOUNTER — Encounter: Payer: Self-pay | Admitting: Cardiovascular Disease

## 2016-10-07 VITALS — BP 120/60 | HR 148 | Ht 61.0 in | Wt 114.8 lb

## 2016-10-07 DIAGNOSIS — I10 Essential (primary) hypertension: Secondary | ICD-10-CM

## 2016-10-07 DIAGNOSIS — I4892 Unspecified atrial flutter: Secondary | ICD-10-CM

## 2016-10-07 MED ORDER — METOPROLOL TARTRATE 50 MG PO TABS
50.0000 mg | ORAL_TABLET | Freq: Two times a day (BID) | ORAL | 3 refills | Status: DC
Start: 2016-10-07 — End: 2017-02-01

## 2016-10-07 NOTE — Progress Notes (Signed)
Cardiology Office Note   Date:  10/07/2016   ID:  Alice Delgado, DOB [birth year], MRN [id number]  PCP:  Einar Pheasant, MD  Cardiologist:   Kathlyn Sacramento, MD   Chief Complaint  Patient presents with  . other    F/u echo C/O SOB. Meds reviewed verbally with pt.      History of Present Illness: Alice Delgado is a 82 y.o. female who Is here today for a follow-up visit regarding atrial flutter.  She has chronic medical conditions that include hypertension, hyperlipidemia, chronic back pain, hypothyroidism and asthma.   She is known to have refractory hypertension with negative workup for secondary hypertension.  She was diagnosed with stage IV lung cancer and currently getting chemotherapy. She was noted to be tachycardic and was found to have an atrial flutter with 2-1 AV block. During last visit, I switched amlodipine and hydralazine to diltiazem and started her on anticoagulation with Eliquis. She did not tolerate higher doses of diltiazem due to nausea. Echocardiogram fortunately showed normal LV systolic function with mild to moderate tricuspid regurgitation. She continues to be tachycardic with dizziness. Occasional chest pain and shortness of breath.   Past Medical History:  Diagnosis Date  . Asthma   . Diverticulosis   . GERD (gastroesophageal reflux disease)   . Hiatal hernia   . Hypercholesterolemia   . Hyperglycemia   . Hypertension   . Hypothyroidism   . Osteoarthritis    lumbar disc dz, cervical disc dz, hands  . Osteoporosis    intolerance to fosamax, evista and miacalcin  . Peptic ulcer disease   . Pernicious anemia   . Vitamin D deficiency     Past Surgical History:  Procedure Laterality Date  . ABDOMINAL HYSTERECTOMY     with benign ovarian tumor removed.    Marland Kitchen FLEXIBLE BRONCHOSCOPY N/A 06/16/2016   Procedure: FLEXIBLE BRONCHOSCOPY;  Surgeon: Allyne Gee, MD;  Location: [facility] ORS;  Service: Pulmonary;  Laterality: N/A;  . LUMBAR FUSION        Current Outpatient Prescriptions  Medication Sig Dispense Refill  . albuterol (PROAIR HFA) 108 (90 BASE) MCG/ACT inhaler Inhale 1 puff into the lungs every 6 (six) hours as needed for wheezing or shortness of breath.    Marland Kitchen apixaban (ELIQUIS) 2.5 MG TABS tablet Take 1 tablet (2.5 mg total) by mouth 2 (two) times daily. 60 tablet 3  . BREO ELLIPTA 100-25 MCG/INH AEPB INL 1 PUFF PO D  4  . cyanocobalamin (,VITAMIN B-12,) 1000 MCG/ML injection Inject 1 mL (1,000 mcg total) into the muscle every 30 (thirty) days. 10 mL 1  . diltiazem (CARTIA XT) 120 MG 24 hr capsule Take 1 capsule (120 mg total) by mouth 2 (two) times daily. 30 capsule 3  . HYDROcodone-acetaminophen (VICODIN) 5-325 mg TABS tablet One tablet q day to bid prn 40 tablet 0  . ipratropium-albuterol (DUONEB) 0.5-2.5 (3) MG/3ML SOLN Take 3 mLs by nebulization as needed.    . montelukast (SINGULAIR) 10 MG tablet Take 10 mg by mouth at bedtime.    . nitroGLYCERIN (NITROSTAT) 0.4 MG SL tablet Place 1 tablet (0.4 mg total) under the tongue every 5 (five) minutes as needed for chest pain. 25 tablet 2  . ondansetron (ZOFRAN) 4 MG tablet TK 1 T PO BID PRF NAUSEA  1  . spironolactone (ALDACTONE) 25 MG tablet TAKE 1 TABLET BY MOUTH DAILY 30 tablet 3  . triamcinolone ointment (KENALOG) 0.5 % Apply 1 application topically 2 (two)  times daily. 30 g 0   No current facility-administered medications for this visit.     Allergies:   Penicillins; Augmentin [amoxicillin-pot clavulanate]; Evista [raloxifene]; Fosamax [alendronate sodium]; Lipitor [atorvastatin]; Miralax [polyethylene glycol]; and Zetia [ezetimibe]    Social History:  The patient  reports that she has quit smoking. Her smoking use included Cigarettes. She quit after 15.00 years of use. She has never used smokeless tobacco. She reports that she does not drink alcohol or use drugs.   Family History:  The patient's family history includes Breast cancer in her sister; Cancer in her  mother; Heart attack in her father; Heart disease in her father.    ROS:  Please see the history of present illness.   Otherwise, review of systems are positive for none.   All other systems are reviewed and negative.    PHYSICAL EXAM: VS:  BP 120/60 (BP Location: Right Arm, Patient Position: Sitting, Cuff Size: Normal)   Pulse (!) 148   Ht '5\' 1"'$  (1.549 m)   Wt 114 lb 12 oz (52.1 kg)   BMI 21.68 kg/m  , BMI Body mass index is 21.68 kg/m. GEN: Well nourished, well developed, in no acute distress  HEENT: normal  Neck: no JVD, carotid bruits, or masses Cardiac: RRR and tachycardic; no murmurs, rubs, or gallops,no edema  Respiratory:  clear to auscultation bilaterally, normal work of breathing GI: soft, nontender, nondistended, + BS MS: no deformity or atrophy  Skin: warm and dry, no rash Neuro:  Strength and sensation are intact Psych: euthymic mood, full affect   EKG:  EKG is  ordered today. The EKG showed atrial flutter with 2-1 AV block and a heart rate 151 bpm.  Recent Labs: 07/22/2016: TSH 4.450 09/20/2016: ALT 11; BUN 23; Creatinine, Ser 1.39; Hemoglobin 11.9; Platelets 241; Potassium 3.7; Sodium 126    Lipid Panel    Component Value Date/Time   CHOL 202 (H) 11/19/2013 1108   TRIG 199.0 (H) 11/19/2013 1108   HDL 44.40 11/19/2013 1108   CHOLHDL 5 11/19/2013 1108   VLDL 39.8 11/19/2013 1108   LDLCALC 122 (H) 05/29/2013 0811   LDLDIRECT 131.1 11/19/2013 1108      Wt Readings from Last 3 Encounters:  10/07/16 114 lb 12 oz (52.1 kg)  09/20/16 113 lb 4 oz (51.4 kg)  09/13/16 115 lb 4.8 oz (52.3 kg)        ASSESSMENT AND PLAN:  1. Atrial flutter with rapid ventricular response:  She is not tolerating higher doses of diltiazem due to nausea. Thus, I elected to switch her to metoprolol tartrate 50 mg twice daily. Continue anticoagulation with low dose Eliquis considering her age and weight. Follow-up in 2 weeks. This ventricular rate continues to be high, we can  consider anticoagulation given that she has not missed any doses of Eliquis in the last 2 weeks. Previously, Eliquis was held for few days for thoracentesis. Given her lung cancer diagnosis, I would like to avoid cardioversion if at all possible.  2. Exertional dyspnea:  Likely due to tachycardia. Fortunately, echocardiogram showed no evidence of tachycardia-induced cardiomyopathy.  3. Essential hypertension:   Blood pressure is controlled.   Disposition:   FU with me in 2 weeks  Signed,  Kathlyn Sacramento, MD  10/07/2016 2:20 PM    [place]

## 2016-10-07 NOTE — Telephone Encounter (Signed)
Medication Samples have been provided to the patient.  Drug name: Eliquis       Strength: '5mg'$         Qty: 2 boxes  LOT: [id number]  Exp.Date: 10/2018  Dosing instructions: one tablet twice daily  The patient has been instructed regarding the correct time, dose, and frequency of taking this medication, including desired effects and most common side effects.   Georgiana Shore 4:15 PM 10/07/2016   Samples at front desk. Pt notified and will pick up tomorrow

## 2016-10-07 NOTE — Patient Instructions (Signed)
Medication Instructions:  Your physician has recommended you make the following change in your medication:  STOP taking diltiazem  START taking metoprolol '50mg'$  twice daily   Labwork: none  Testing/Procedures: none  Follow-Up: Your physician recommends that you schedule a follow-up appointment in: 2 weeks with Dr. Fletcher Anon.    Any Other Special Instructions Will Be Listed Below (If Applicable).     If you need a refill on your cardiac medications before your next appointment, please call your pharmacy.

## 2016-10-08 ENCOUNTER — Ambulatory Visit (INDEPENDENT_AMBULATORY_CARE_PROVIDER_SITE_OTHER): Payer: Medicare Other | Admitting: Internal Medicine

## 2016-10-08 ENCOUNTER — Encounter: Payer: Self-pay | Admitting: Internal Medicine

## 2016-10-08 DIAGNOSIS — C3412 Malignant neoplasm of upper lobe, left bronchus or lung: Secondary | ICD-10-CM | POA: Diagnosis not present

## 2016-10-08 DIAGNOSIS — E119 Type 2 diabetes mellitus without complications: Secondary | ICD-10-CM

## 2016-10-08 DIAGNOSIS — R634 Abnormal weight loss: Secondary | ICD-10-CM | POA: Diagnosis not present

## 2016-10-08 DIAGNOSIS — N183 Chronic kidney disease, stage 3 unspecified: Secondary | ICD-10-CM

## 2016-10-08 DIAGNOSIS — E7800 Pure hypercholesterolemia, unspecified: Secondary | ICD-10-CM | POA: Diagnosis not present

## 2016-10-08 DIAGNOSIS — R000 Tachycardia, unspecified: Secondary | ICD-10-CM

## 2016-10-08 DIAGNOSIS — I10 Essential (primary) hypertension: Secondary | ICD-10-CM

## 2016-10-08 MED ORDER — MONTELUKAST SODIUM 10 MG PO TABS: 10.0000 mg | ORAL_TABLET | Freq: Every day | ORAL | 1 refills | Status: DC

## 2016-10-08 MED ORDER — CYANOCOBALAMIN 1000 MCG/ML IJ SOLN
1000.0000 ug | INTRAMUSCULAR | 1 refills | Status: AC
Start: 2016-10-08 — End: ?

## 2016-10-08 NOTE — Progress Notes (Signed)
Pre visit review using our clinic review tool, if applicable. No additional management support is needed unless otherwise documented below in the visit note. 

## 2016-10-08 NOTE — Progress Notes (Addendum)
Patient ID: SANAIYAH KIRCHHOFF, female   DOB: [birth year], 82 y.o.   MRN: [id number]   Subjective:    Patient ID: CHARDAI GANGEMI, female    DOB: 1929/03/22, 82 y.o.   MRN: [id number]  HPI  Patient here to reestablish care.  I have not seen her since 2015.  Since her last visit here, she has been diagnosed with squamous cell lung cancer - stage IV.  She also has a history of hypercholesterolemia, hypertension and hypothyroidism.  Was also recently found to have atrial flutter.  Followed by cardiology.  On eliquis.  She is accompanied by her two daughters.  History obtained from all three of them.  Increased stress with her current diagnosis and treatment.  Plans to f/u with oncology 10/15/16.  Currently on palliative treatment with Keytruda.  Due for third cycle 10/15/16.  She is followed by Dr Janese Banks and Dr Rogue Bussing.  She is eating.  Has lost weight.  No vomiting.  Bowels stable.  States her blood pressure has been doing much better.  She has been followed by Dr Fletcher Anon.  ECHO with normal LV function.  Had mild to moderate tricuspid regurgitation.  Continues to be tachycardic.  Just saw Dr Fletcher Anon yesterday.  Heart rate a little better today.  Had nausea with diltiazem.  He just changed her to metoprolol.  Just started last night.     Past Medical History:  Diagnosis Date  . Asthma   . Diverticulosis   . GERD (gastroesophageal reflux disease)   . Hiatal hernia   . Hypercholesterolemia   . Hyperglycemia   . Hypertension   . Hypothyroidism   . Osteoarthritis    lumbar disc dz, cervical disc dz, hands  . Osteoporosis    intolerance to fosamax, evista and miacalcin  . Peptic ulcer disease   . Pernicious anemia   . Vitamin D deficiency    Past Surgical History:  Procedure Laterality Date  . ABDOMINAL HYSTERECTOMY     with benign ovarian tumor removed.    Marland Kitchen FLEXIBLE BRONCHOSCOPY N/A 06/16/2016   Procedure: FLEXIBLE BRONCHOSCOPY;  Surgeon: Allyne Gee, MD;  Location: [facility] ORS;  Service:  Pulmonary;  Laterality: N/A;  . LUMBAR FUSION     Family History  Problem Relation Age of Onset  . Heart disease Father     myocardial infarction  . Heart attack Father   . Cancer Mother     cancer of lymph nodes and kidney  . Breast cancer Sister   . Colon cancer Neg Hx    Social History   Social History  . Marital status: Widowed    Spouse name: N/A  . Number of children: 2  . Years of education: N/A   Social History Main Topics  . Smoking status: Former Smoker    Years: 15.00    Types: Cigarettes  . Smokeless tobacco: Never Used  . Alcohol use No  . Drug use: No  . Sexual activity: Not Asked   Other Topics Concern  . None   Social History Narrative  . None    Outpatient Encounter Prescriptions as of 10/08/2016  Medication Sig  . albuterol (PROAIR HFA) 108 (90 BASE) MCG/ACT inhaler Inhale 1 puff into the lungs every 6 (six) hours as needed for wheezing or shortness of breath.  Marland Kitchen apixaban (ELIQUIS) 2.5 MG TABS tablet Take 1 tablet (2.5 mg total) by mouth 2 (two) times daily.  Marland Kitchen BREO ELLIPTA 100-25 MCG/INH AEPB INL 1 PUFF PO D  .  cyanocobalamin (,VITAMIN B-12,) 1000 MCG/ML injection Inject 1 mL (1,000 mcg total) into the muscle every 30 (thirty) days.  Marland Kitchen HYDROcodone-acetaminophen (VICODIN) 5-325 mg TABS tablet One tablet q day to bid prn  . ipratropium-albuterol (DUONEB) 0.5-2.5 (3) MG/3ML SOLN Take 3 mLs by nebulization as needed.  . metoprolol (LOPRESSOR) 50 MG tablet Take 1 tablet (50 mg total) by mouth 2 (two) times daily.  . montelukast (SINGULAIR) 10 MG tablet Take 1 tablet (10 mg total) by mouth at bedtime.  . nitroGLYCERIN (NITROSTAT) 0.4 MG SL tablet Place 1 tablet (0.4 mg total) under the tongue every 5 (five) minutes as needed for chest pain.  Marland Kitchen ondansetron (ZOFRAN) 4 MG tablet TK 1 T PO BID PRF NAUSEA  . spironolactone (ALDACTONE) 25 MG tablet TAKE 1 TABLET BY MOUTH DAILY  . triamcinolone ointment (KENALOG) 0.5 % Apply 1 application topically 2 (two)  times daily.  . [DISCONTINUED] cyanocobalamin (,VITAMIN B-12,) 1000 MCG/ML injection Inject 1 mL (1,000 mcg total) into the muscle every 30 (thirty) days.  . [DISCONTINUED] montelukast (SINGULAIR) 10 MG tablet Take 10 mg by mouth at bedtime.   No facility-administered encounter medications on file as of 10/08/2016.     Review of Systems  Constitutional: Positive for fatigue.       She is eating.  Weight stable recently.  Has lost a lot of weight since her visit with me.    HENT: Negative for congestion and sinus pain.   Respiratory: Negative for chest tightness.        Breathing overall stable recently.    Cardiovascular: Negative for chest pain and leg swelling.       Increased heart rate.    Gastrointestinal: Negative for abdominal pain, diarrhea, nausea and vomiting.  Genitourinary: Negative for difficulty urinating and dysuria.  Musculoskeletal: Negative for joint swelling and myalgias.  Skin: Negative for color change and rash.  Neurological: Negative for headaches.  Psychiatric/Behavioral: Negative for agitation and dysphoric mood.       Objective:    Physical Exam  Constitutional: She appears well-developed. No distress.  HENT:  Nose: Nose normal.  Mouth/Throat: Oropharynx is clear and moist.  Neck: Neck supple. No thyromegaly present.  Cardiovascular:  Increased heart rate - 112.    Pulmonary/Chest: Breath sounds normal. No respiratory distress. She has no wheezes.  Abdominal: Soft. Bowel sounds are normal. There is no tenderness.  Musculoskeletal: She exhibits no edema or tenderness.  Lymphadenopathy:    She has no cervical adenopathy.  Skin: No rash noted. No erythema.  Psychiatric: She has a normal mood and affect. Her behavior is normal.    BP 114/76   Pulse (!) 123   Temp 97.3 F (36.3 C) (Oral)   Ht 5' 1"  (1.549 m)   Wt 114 lb (51.7 kg)   SpO2 98%   BMI 21.54 kg/m  Wt Readings from Last 3 Encounters:  10/15/16 115 lb 11.2 oz (52.5 kg)  10/08/16 114  lb (51.7 kg)  10/07/16 114 lb 12 oz (52.1 kg)     Lab Results  Component Value Date   WBC 6.9 10/15/2016   HGB 11.6 (L) 10/15/2016   HCT 35.2 10/15/2016   PLT 268 10/15/2016   GLUCOSE 113 (H) 10/15/2016   CHOL 202 (H) 11/19/2013   TRIG 199.0 (H) 11/19/2013   HDL 44.40 11/19/2013   LDLDIRECT 131.1 11/19/2013   LDLCALC 122 (H) 05/29/2013   ALT 10 (L) 10/15/2016   AST 17 10/15/2016   NA 130 (L) 10/15/2016  K 3.8 10/15/2016   CL 99 (L) 10/15/2016   CREATININE 1.29 (H) 10/15/2016   BUN 19 10/15/2016   CO2 25 10/15/2016   TSH 4.450 07/22/2016   HGBA1C 6.4 11/13/2013    Dg Chest 2 View  Result Date: 09/20/2016 CLINICAL DATA:  Active cough and shortness of breath. History of lung malignancy as well as asthma -COPD. Assess volume of pleural effusion. EXAM: CHEST  2 VIEW COMPARISON:  Portable chest x-ray of August 27, 2016 and CT scan of the chest of August 26, 2016. FINDINGS: There is a small left pleural effusion. The volume appears less than that seen on the previous study. There is persistent volume loss on the left. Perihilar density on the left is stable. The right lung is hyperinflated and clear. The left heart border is obscured. The right heart border appears normal. There is calcification in the wall of the thoracic aorta. There is S shaped thoracolumbar scoliosis which is stable. IMPRESSION: COPD. There has not been significant re-accumulation of pleural fluid on the left. Small mild is present. There is persistent prominence of the soft tissues of the left perihilar region anteriorly consistent with known left upper lobe malignancy. Thoracic aortic atherosclerosis. Electronically Signed   By: David  Martinique M.D.   On: 09/20/2016 16:52       Assessment & Plan:   Problem List Items Addressed This Visit    CKD (chronic kidney disease), stage III    Creatinine 1.39 on last check 09/20/16.  Follow.        Diabetes mellitus ([place])    Follow met b and a1c.        Essential  hypertension    Blood pressure doing well on current regimen.  Follow pressures.  Follow metabolic panel.        Hypercholesterolemia    Follow lipid panel.        Primary cancer of left upper lobe of lung [facility])    Seeing oncology.  Planning for third cycle keytruda 10/15/16.        Relevant Orders   Ambulatory referral to Pulmonology   Tachycardia    Just saw Dr Fletcher Anon yesterday.  Placed on metoprolol.  Heart rate as outlined.  Follow.  Echo as outlined.        Weight loss    Weight down a significant amount.  Stable from recent checks.  Encourage increased po intake.  Follow.            Einar Pheasant, MD

## 2016-10-12 ENCOUNTER — Ambulatory Visit: Payer: Medicare Other

## 2016-10-12 ENCOUNTER — Ambulatory Visit: Payer: Medicare Other | Admitting: Oncology

## 2016-10-12 ENCOUNTER — Other Ambulatory Visit: Payer: Medicare Other

## 2016-10-12 NOTE — Addendum Note (Signed)
Addended by: Othelia Pulling C on: 10/12/2016 08:00 AM   Modules accepted: Orders

## 2016-10-13 DIAGNOSIS — J449 Chronic obstructive pulmonary disease, unspecified: Secondary | ICD-10-CM | POA: Diagnosis not present

## 2016-10-15 ENCOUNTER — Inpatient Hospital Stay: Payer: Medicare Other

## 2016-10-15 ENCOUNTER — Inpatient Hospital Stay (HOSPITAL_BASED_OUTPATIENT_CLINIC_OR_DEPARTMENT_OTHER): Payer: Medicare Other | Admitting: Oncology

## 2016-10-15 VITALS — BP 115/67 | HR 75 | Temp 98.8°F | Resp 20 | Wt 115.7 lb

## 2016-10-15 DIAGNOSIS — M810 Age-related osteoporosis without current pathological fracture: Secondary | ICD-10-CM | POA: Diagnosis not present

## 2016-10-15 DIAGNOSIS — C3412 Malignant neoplasm of upper lobe, left bronchus or lung: Secondary | ICD-10-CM

## 2016-10-15 DIAGNOSIS — E559 Vitamin D deficiency, unspecified: Secondary | ICD-10-CM

## 2016-10-15 DIAGNOSIS — J90 Pleural effusion, not elsewhere classified: Secondary | ICD-10-CM | POA: Diagnosis not present

## 2016-10-15 DIAGNOSIS — I129 Hypertensive chronic kidney disease with stage 1 through stage 4 chronic kidney disease, or unspecified chronic kidney disease: Secondary | ICD-10-CM

## 2016-10-15 DIAGNOSIS — J449 Chronic obstructive pulmonary disease, unspecified: Secondary | ICD-10-CM

## 2016-10-15 DIAGNOSIS — R0789 Other chest pain: Secondary | ICD-10-CM | POA: Diagnosis not present

## 2016-10-15 DIAGNOSIS — I4892 Unspecified atrial flutter: Secondary | ICD-10-CM | POA: Diagnosis not present

## 2016-10-15 DIAGNOSIS — Z8711 Personal history of peptic ulcer disease: Secondary | ICD-10-CM

## 2016-10-15 DIAGNOSIS — M1990 Unspecified osteoarthritis, unspecified site: Secondary | ICD-10-CM | POA: Diagnosis not present

## 2016-10-15 DIAGNOSIS — K449 Diaphragmatic hernia without obstruction or gangrene: Secondary | ICD-10-CM | POA: Diagnosis not present

## 2016-10-15 DIAGNOSIS — E878 Other disorders of electrolyte and fluid balance, not elsewhere classified: Secondary | ICD-10-CM | POA: Diagnosis not present

## 2016-10-15 DIAGNOSIS — Z87891 Personal history of nicotine dependence: Secondary | ICD-10-CM

## 2016-10-15 DIAGNOSIS — D510 Vitamin B12 deficiency anemia due to intrinsic factor deficiency: Secondary | ICD-10-CM

## 2016-10-15 DIAGNOSIS — Z8719 Personal history of other diseases of the digestive system: Secondary | ICD-10-CM | POA: Diagnosis not present

## 2016-10-15 DIAGNOSIS — R591 Generalized enlarged lymph nodes: Secondary | ICD-10-CM

## 2016-10-15 DIAGNOSIS — E871 Hypo-osmolality and hyponatremia: Secondary | ICD-10-CM

## 2016-10-15 DIAGNOSIS — I700 Atherosclerosis of aorta: Secondary | ICD-10-CM | POA: Diagnosis not present

## 2016-10-15 DIAGNOSIS — K219 Gastro-esophageal reflux disease without esophagitis: Secondary | ICD-10-CM

## 2016-10-15 DIAGNOSIS — Z993 Dependence on wheelchair: Secondary | ICD-10-CM

## 2016-10-15 DIAGNOSIS — Z9981 Dependence on supplemental oxygen: Secondary | ICD-10-CM

## 2016-10-15 DIAGNOSIS — E785 Hyperlipidemia, unspecified: Secondary | ICD-10-CM | POA: Diagnosis not present

## 2016-10-15 DIAGNOSIS — R739 Hyperglycemia, unspecified: Secondary | ICD-10-CM

## 2016-10-15 DIAGNOSIS — N189 Chronic kidney disease, unspecified: Secondary | ICD-10-CM

## 2016-10-15 DIAGNOSIS — E7800 Pure hypercholesterolemia, unspecified: Secondary | ICD-10-CM | POA: Diagnosis not present

## 2016-10-15 DIAGNOSIS — E039 Hypothyroidism, unspecified: Secondary | ICD-10-CM

## 2016-10-15 DIAGNOSIS — C7801 Secondary malignant neoplasm of right lung: Secondary | ICD-10-CM

## 2016-10-15 DIAGNOSIS — Z5112 Encounter for antineoplastic immunotherapy: Secondary | ICD-10-CM | POA: Diagnosis not present

## 2016-10-15 DIAGNOSIS — Z79899 Other long term (current) drug therapy: Secondary | ICD-10-CM

## 2016-10-15 LAB — CBC WITH DIFFERENTIAL/PLATELET
BASOS ABS: 0 10*3/uL (ref 0–0.1)
BASOS PCT: 1 %
EOS PCT: 8 %
Eosinophils Absolute: 0.6 10*3/uL (ref 0–0.7)
HEMATOCRIT: 35.2 % (ref 35.0–47.0)
Hemoglobin: 11.6 g/dL — ABNORMAL LOW (ref 12.0–16.0)
Lymphocytes Relative: 6 %
Lymphs Abs: 0.4 10*3/uL — ABNORMAL LOW (ref 1.0–3.6)
MCH: 25.6 pg — ABNORMAL LOW (ref 26.0–34.0)
MCHC: 32.9 g/dL (ref 32.0–36.0)
MCV: 77.7 fL — ABNORMAL LOW (ref 80.0–100.0)
MONO ABS: 0.7 10*3/uL (ref 0.2–0.9)
Monocytes Relative: 10 %
NEUTROS ABS: 5.3 10*3/uL (ref 1.4–6.5)
Neutrophils Relative %: 75 %
PLATELETS: 268 10*3/uL (ref 150–440)
RBC: 4.53 MIL/uL (ref 3.80–5.20)
RDW: 16.8 % — AB (ref 11.5–14.5)
WBC: 6.9 10*3/uL (ref 3.6–11.0)

## 2016-10-15 LAB — COMPREHENSIVE METABOLIC PANEL
ALBUMIN: 3.1 g/dL — AB (ref 3.5–5.0)
ALT: 10 U/L — AB (ref 14–54)
AST: 17 U/L (ref 15–41)
Alkaline Phosphatase: 86 U/L (ref 38–126)
Anion gap: 6 (ref 5–15)
BUN: 19 mg/dL (ref 6–20)
CHLORIDE: 99 mmol/L — AB (ref 101–111)
CO2: 25 mmol/L (ref 22–32)
CREATININE: 1.29 mg/dL — AB (ref 0.44–1.00)
Calcium: 8.6 mg/dL — ABNORMAL LOW (ref 8.9–10.3)
GFR calc Af Amer: 43 mL/min — ABNORMAL LOW (ref >60)
GFR calc non Af Amer: 37 mL/min — ABNORMAL LOW (ref >60)
GLUCOSE: 113 mg/dL — AB (ref 65–99)
POTASSIUM: 3.8 mmol/L (ref 3.5–5.1)
Sodium: 130 mmol/L — ABNORMAL LOW (ref 135–145)
Total Bilirubin: 0.4 mg/dL (ref 0.3–1.2)
Total Protein: 6.3 g/dL — ABNORMAL LOW (ref 6.5–8.1)

## 2016-10-15 MED ORDER — SODIUM CHLORIDE 0.9 % IV SOLN
INTRAVENOUS | Status: AC
  Administered 2016-10-15: 15:00:00 via INTRAVENOUS
  Filled 2016-10-15: qty 1000

## 2016-10-15 MED ORDER — SODIUM CHLORIDE 0.9 % IV SOLN
Freq: Once | INTRAVENOUS | Status: DC
  Filled 2016-10-15: qty 1000

## 2016-10-15 MED ORDER — SODIUM CHLORIDE 0.9 % IV SOLN
200.0000 mg | Freq: Once | INTRAVENOUS | Status: AC
  Administered 2016-10-15: 200 mg via INTRAVENOUS
  Filled 2016-10-15: qty 8

## 2016-10-15 NOTE — Progress Notes (Signed)
Patient is here for follow up, she is doing well, she does mention pain in the left side of her ribcage, describes it as aggravating and constant 5/10 on pain scale.

## 2016-10-15 NOTE — Progress Notes (Signed)
Hematology/Oncology Consult note [facility]  [phone] [phone]  Patient Care Team: Einar Pheasant, MD as PCP - General (Internal Medicine) Wellington Hampshire, MD as Consulting Physician (Cardiology)   Name of the patient: Alice Delgado  [id number]  April 23, 1929   Date of visit: 10/15/16  Diagnosis- stage IV lung cancer  Chief complaint/ Reason for visit- on treatment assessment prior to cycle #3 of keytruda  Heme/Onc history:   # SEP 2017- SQUAMOUS CELL CA [s/p Bronch Dr.S Khan];PET - LEFT UPPER LOBE MASS- left upper lung collpase; mediastinal LN; Contralateral Right nodules [x3];STAGE IV [contralateral lung nodules]   # PDL-1- 90% [Keytruda]  # Hx of smoking/ COPD/ CKD [creat ~ 1.5]     Patient is a multiple medical problems including hypertension, hyperlipidemia hypothyroidism. She was also recently found to have atrial flutter and follows up with cardiology. She was started on anticoagulation with eliquis.  Interval history- Patient reports some aching pain over her left chest wall which began since her radiation. Denies any falls or injuries. Denies any pain on inspiration.  ECOG PS- 2 Pain scale- 3 Opioid associated constipation- no  Review of systems- Review of Systems  Constitutional: Negative for chills, fever, malaise/fatigue and weight loss.  HENT: Negative for congestion, ear discharge and nosebleeds.   Eyes: Negative for blurred vision.  Respiratory: Positive for shortness of breath. Negative for cough, hemoptysis, sputum production and wheezing.   Cardiovascular: Negative for chest pain, palpitations, orthopnea and claudication.  Gastrointestinal: Negative for abdominal pain, blood in stool, constipation, diarrhea, heartburn, melena, nausea and vomiting.  Genitourinary: Negative for dysuria, flank pain, frequency, hematuria and urgency.  Musculoskeletal: Negative for back pain, joint pain and myalgias.    Skin: Negative for rash.  Neurological: Negative for dizziness, tingling, focal weakness, seizures, weakness and headaches.  Endo/Heme/Allergies: Does not bruise/bleed easily.  Psychiatric/Behavioral: Negative for depression and suicidal ideas. The patient does not have insomnia.      Current treatment- Keytruda   Allergies  Allergen Reactions  . Penicillins Anaphylaxis  . Augmentin [Amoxicillin-Pot Clavulanate]   . Evista [Raloxifene]     Leg discomfort   . Fosamax [Alendronate Sodium]     Gi intolerance   . Lipitor [Atorvastatin]     Bloating and GI upset  . Miralax [Polyethylene Glycol]     Question of hives   . Zetia [Ezetimibe]      Past Medical History:  Diagnosis Date  . Asthma   . Diverticulosis   . GERD (gastroesophageal reflux disease)   . Hiatal hernia   . Hypercholesterolemia   . Hyperglycemia   . Hypertension   . Hypothyroidism   . Osteoarthritis    lumbar disc dz, cervical disc dz, hands  . Osteoporosis    intolerance to fosamax, evista and miacalcin  . Peptic ulcer disease   . Pernicious anemia   . Vitamin D deficiency      Past Surgical History:  Procedure Laterality Date  . ABDOMINAL HYSTERECTOMY     with benign ovarian tumor removed.    Marland Kitchen FLEXIBLE BRONCHOSCOPY N/A 06/16/2016   Procedure: FLEXIBLE BRONCHOSCOPY;  Surgeon: Allyne Gee, MD;  Location: [facility] ORS;  Service: Pulmonary;  Laterality: N/A;  . LUMBAR FUSION      Social History   Social History  . Marital status: Widowed    Spouse name: N/A  . Number of children: 2  . Years of education: N/A   Occupational History  . Not on file.  Social History Main Topics  . Smoking status: Former Smoker    Years: 15.00    Types: Cigarettes  . Smokeless tobacco: Never Used  . Alcohol use No  . Drug use: No  . Sexual activity: Not on file   Other Topics Concern  . Not on file   Social History Narrative  . No narrative on file    Family History  Problem Relation Age of Onset   . Heart disease Father     myocardial infarction  . Heart attack Father   . Cancer Mother     cancer of lymph nodes and kidney  . Breast cancer Sister   . Colon cancer Neg Hx      Current Outpatient Prescriptions:  .  albuterol (PROAIR HFA) 108 (90 BASE) MCG/ACT inhaler, Inhale 1 puff into the lungs every 6 (six) hours as needed for wheezing or shortness of breath., Disp: , Rfl:  .  apixaban (ELIQUIS) 2.5 MG TABS tablet, Take 1 tablet (2.5 mg total) by mouth 2 (two) times daily., Disp: 60 tablet, Rfl: 3 .  BREO ELLIPTA 100-25 MCG/INH AEPB, INL 1 PUFF PO D, Disp: , Rfl: 4 .  cyanocobalamin (,VITAMIN B-12,) 1000 MCG/ML injection, Inject 1 mL (1,000 mcg total) into the muscle every 30 (thirty) days., Disp: 10 mL, Rfl: 1 .  HYDROcodone-acetaminophen (VICODIN) 5-325 mg TABS tablet, One tablet q day to bid prn, Disp: 40 tablet, Rfl: 0 .  ipratropium-albuterol (DUONEB) 0.5-2.5 (3) MG/3ML SOLN, Take 3 mLs by nebulization as needed., Disp: , Rfl:  .  metoprolol (LOPRESSOR) 50 MG tablet, Take 1 tablet (50 mg total) by mouth 2 (two) times daily., Disp: 60 tablet, Rfl: 3 .  montelukast (SINGULAIR) 10 MG tablet, Take 1 tablet (10 mg total) by mouth at bedtime., Disp: 90 tablet, Rfl: 1 .  nitroGLYCERIN (NITROSTAT) 0.4 MG SL tablet, Place 1 tablet (0.4 mg total) under the tongue every 5 (five) minutes as needed for chest pain., Disp: 25 tablet, Rfl: 2 .  ondansetron (ZOFRAN) 4 MG tablet, TK 1 T PO BID PRF NAUSEA, Disp: , Rfl: 1 .  spironolactone (ALDACTONE) 25 MG tablet, TAKE 1 TABLET BY MOUTH DAILY, Disp: 30 tablet, Rfl: 3 .  triamcinolone ointment (KENALOG) 0.5 %, Apply 1 application topically 2 (two) times daily., Disp: 30 g, Rfl: 0  Physical exam:  Vitals:   10/15/16 1339  BP: 115/67  Pulse: 75  Resp: 20  Temp: 98.8 F (37.1 C)  TempSrc: Tympanic  SpO2: 97%  Weight: 115 lb 11.2 oz (52.5 kg)   Physical Exam  Constitutional: She is oriented to person, place, and time.  She is elderly and  frail. Sitting on a wheelchair and appears in no acute distress. She is on chronic home oxygen  HENT:  Head: Normocephalic and atraumatic.  Eyes: EOM are normal. Pupils are equal, round, and reactive to light.  Neck: Normal range of motion.  Cardiovascular: Normal rate, regular rhythm and normal heart sounds.   Pulmonary/Chest: Effort normal and breath sounds normal. She exhibits no tenderness.  Abdominal: Soft. Bowel sounds are normal.  Neurological: She is alert and oriented to person, place, and time.  Skin: Skin is warm and dry.     CMP Latest Ref Rng & Units 10/15/2016  Glucose 65 - 99 mg/dL 113(H)  BUN 6 - 20 mg/dL 19  Creatinine 0.44 - 1.00 mg/dL 1.29(H)  Sodium 135 - 145 mmol/L 130(L)  Potassium 3.5 - 5.1 mmol/L 3.8  Chloride 101 - 111  mmol/L 99(L)  CO2 22 - 32 mmol/L 25  Calcium 8.9 - 10.3 mg/dL 8.6(L)  Total Protein 6.5 - 8.1 g/dL 6.3(L)  Total Bilirubin 0.3 - 1.2 mg/dL 0.4  Alkaline Phos 38 - 126 U/L 86  AST 15 - 41 U/L 17  ALT 14 - 54 U/L 10(L)   CBC Latest Ref Rng & Units 10/15/2016  WBC 3.6 - 11.0 K/uL 6.9  Hemoglobin 12.0 - 16.0 g/dL 11.6(L)  Hematocrit 35.0 - 47.0 % 35.2  Platelets 150 - 440 K/uL 268      Dg Chest 2 View  Result Date: 09/20/2016 CLINICAL DATA:  Active cough and shortness of breath. History of lung malignancy as well as asthma -COPD. Assess volume of pleural effusion. EXAM: CHEST  2 VIEW COMPARISON:  Portable chest x-ray of August 27, 2016 and CT scan of the chest of August 26, 2016. FINDINGS: There is a small left pleural effusion. The volume appears less than that seen on the previous study. There is persistent volume loss on the left. Perihilar density on the left is stable. The right lung is hyperinflated and clear. The left heart border is obscured. The right heart border appears normal. There is calcification in the wall of the thoracic aorta. There is S shaped thoracolumbar scoliosis which is stable. IMPRESSION: COPD. There has not been  significant re-accumulation of pleural fluid on the left. Small mild is present. There is persistent prominence of the soft tissues of the left perihilar region anteriorly consistent with known left upper lobe malignancy. Thoracic aortic atherosclerosis. Electronically Signed   By: David  Martinique M.D.   On: 09/20/2016 16:52     Assessment and plan- Patient is a 82 y.o. female with a history of stage IV squamous cell carcinoma of the lung with bilateral pulmonary nodules currently on palliative treatment with Keytruda status post 2 cycles. Last cycle was on 09/20/2016. She is here for assessment prior to cycle #3  1. Counts are okay to proceed with cycle #3 of Keytruda as planned. She will follow-up with Dr. Rogue Bussing 3 weeks from now with CBC CMP prior to the next cycle   2. Hyponatremia/hypochloremia- will give 500 mL of normal saline over 1 hour after her treatment today  3. Left chest wall pain- no focal tenderness to palpation.  I did recommend getting a chest x-ray for further evaluation. Patient would like to hold off on that at this time and will let us know if her pain is any worse Visit Diagnosis 1. Primary cancer of left upper lobe of lung ([place])      Dr. Randa Evens, MD, MPH [facility] Pager- [phone] 10/15/2016 11:21 AM

## 2016-10-18 DIAGNOSIS — J449 Chronic obstructive pulmonary disease, unspecified: Secondary | ICD-10-CM | POA: Diagnosis not present

## 2016-10-19 ENCOUNTER — Ambulatory Visit: Payer: Medicare Other | Admitting: Internal Medicine

## 2016-10-19 ENCOUNTER — Encounter: Payer: Self-pay | Admitting: Internal Medicine

## 2016-10-19 ENCOUNTER — Telehealth: Payer: Self-pay | Admitting: Internal Medicine

## 2016-10-19 DIAGNOSIS — R000 Tachycardia, unspecified: Secondary | ICD-10-CM | POA: Insufficient documentation

## 2016-10-19 NOTE — Assessment & Plan Note (Signed)
Weight down a significant amount.  Stable from recent checks.  Encourage increased po intake.  Follow.

## 2016-10-19 NOTE — Assessment & Plan Note (Signed)
Seeing oncology.  Planning for third cycle keytruda 10/15/16.

## 2016-10-19 NOTE — Assessment & Plan Note (Signed)
Follow lipid panel.   

## 2016-10-19 NOTE — Telephone Encounter (Signed)
Lori lvm for her mom. She was questioning her mom's rx. She didn't say the name of her rx but she was saying that it was very expensive. She needs to refill it but didn't know if the pulmonologist that she is being referred to will change her rx. She would like to speak to someone about this. [phone]

## 2016-10-19 NOTE — Telephone Encounter (Signed)
Spoke with Cecille Rubin daughter it she states it was in regards to the referral to the pulmonologist.  She is aware of appointment.

## 2016-10-19 NOTE — Addendum Note (Signed)
Addended by: Alisa Graff on: 10/19/2016 12:40 PM   Modules accepted: Orders

## 2016-10-19 NOTE — Assessment & Plan Note (Signed)
Follow met b and a1c.

## 2016-10-19 NOTE — Assessment & Plan Note (Signed)
Blood pressure doing well on current regimen.  Follow pressures.  Follow metabolic panel.   

## 2016-10-19 NOTE — Telephone Encounter (Signed)
Leftvoicemail to call  

## 2016-10-19 NOTE — Assessment & Plan Note (Signed)
Creatinine 1.39 on last check 09/20/16.  Follow.

## 2016-10-19 NOTE — Assessment & Plan Note (Signed)
Just saw Dr Fletcher Anon yesterday.  Placed on metoprolol.  Heart rate as outlined.  Follow.  Echo as outlined.

## 2016-10-21 ENCOUNTER — Encounter: Payer: Self-pay | Admitting: Internal Medicine

## 2016-10-25 ENCOUNTER — Other Ambulatory Visit: Payer: Self-pay | Admitting: *Deleted

## 2016-10-25 ENCOUNTER — Telehealth: Payer: Self-pay | Admitting: Internal Medicine

## 2016-10-25 ENCOUNTER — Encounter: Payer: Self-pay | Admitting: Cardiovascular Disease

## 2016-10-25 ENCOUNTER — Ambulatory Visit
Admission: RE | Admit: 2016-10-25 | Discharge: 2016-10-25 | Disposition: A | Discharge status: Home / Self Care | Payer: Medicare Other | Source: Ambulatory Visit | Attending: Internal Medicine | Admitting: Internal Medicine

## 2016-10-25 ENCOUNTER — Other Ambulatory Visit: Payer: Self-pay | Admitting: Internal Medicine

## 2016-10-25 ENCOUNTER — Ambulatory Visit (INDEPENDENT_AMBULATORY_CARE_PROVIDER_SITE_OTHER): Payer: Medicare Other | Admitting: Cardiovascular Disease

## 2016-10-25 ENCOUNTER — Telehealth: Payer: Self-pay | Admitting: *Deleted

## 2016-10-25 VITALS — BP 108/60 | HR 90 | Ht 67.0 in | Wt 114.0 lb

## 2016-10-25 DIAGNOSIS — I10 Essential (primary) hypertension: Secondary | ICD-10-CM

## 2016-10-25 DIAGNOSIS — J90 Pleural effusion, not elsewhere classified: Secondary | ICD-10-CM | POA: Insufficient documentation

## 2016-10-25 DIAGNOSIS — R0602 Shortness of breath: Secondary | ICD-10-CM | POA: Diagnosis not present

## 2016-10-25 DIAGNOSIS — I4892 Unspecified atrial flutter: Secondary | ICD-10-CM | POA: Diagnosis not present

## 2016-10-25 DIAGNOSIS — C3412 Malignant neoplasm of upper lobe, left bronchus or lung: Secondary | ICD-10-CM

## 2016-10-25 DIAGNOSIS — J441 Chronic obstructive pulmonary disease with (acute) exacerbation: Secondary | ICD-10-CM

## 2016-10-25 MED ORDER — LEVOFLOXACIN 250 MG PO TABS: ORAL_TABLET | ORAL | 0 refills | Status: DC

## 2016-10-25 MED ORDER — HYDROXYZINE HCL 25 MG PO TABS: 25.0000 mg | ORAL_TABLET | Freq: Three times a day (TID) | ORAL | 0 refills | Status: DC | PRN

## 2016-10-25 NOTE — Telephone Encounter (Signed)
Contacted daughter Cecille Rubin. Explained that cxr demonstrated signs of pneumonia. She is aware that the cardiologist gave patient an antibotic-Levaquin. I explained that Dr. Rogue Bussing would like pt to have a ct scan before next visit next week.  I explained that scheduling team will contact to her tomorrow with an appointment.

## 2016-10-25 NOTE — Telephone Encounter (Signed)
Spoke with Dr. Rogue Bussing Patient's symptoms. md gave v/o to send pt for chest xray stat. Will evaluate the results and contact daughter back regarding the plan. Daughter Cecille Rubin contacted and she will bring her mom for cxr this afternoon.

## 2016-10-25 NOTE — Progress Notes (Signed)
Cardiology Office Note   Date:  10/25/2016   ID:  Alice Delgado, DOB Jul 28, 1929, MRN [id number]  PCP:  Einar Pheasant, MD  Cardiologist:   Kathlyn Sacramento, MD   Chief Complaint  Patient presents with  . other    2 week follow up. Meds reviewed by the pt. verbally. Pt. c/o shortness of breath. Pt. just had a chest x-ray from the oncologist.       History of Present Illness: Alice Delgado is a 83 y.o. female who Is here today for a follow-up visit regarding atrial flutter.  She has chronic medical conditions that include hypertension, hyperlipidemia, chronic back pain, hypothyroidism and asthma.   She is known to have refractory hypertension with negative workup for secondary hypertension.  She was diagnosed with stage IV lung cancer and currently getting palliative chemotherapy.  She was diagnosed with atrial flutter with RVR few months ago. She did not tolerate diltiazem very well due to nausea. Echocardiogram fortunately showed normal LV systolic function with mild to moderate tricuspid regurgitation. During last visit, I switched diltiazem to metoprolol with improvement in heart rate since then. She reports worsening dyspnea with cough productive of yellowish sputum. She had a chest x-ray done today which showed possible right-sided pneumonia with small pleural effusion.  Past Medical History:  Diagnosis Date  . Asthma   . Diverticulosis   . GERD (gastroesophageal reflux disease)   . Hiatal hernia   . Hypercholesterolemia   . Hyperglycemia   . Hypertension   . Hypothyroidism   . Osteoarthritis    lumbar disc dz, cervical disc dz, hands  . Osteoporosis    intolerance to fosamax, evista and miacalcin  . Peptic ulcer disease   . Pernicious anemia   . Vitamin D deficiency     Past Surgical History:  Procedure Laterality Date  . ABDOMINAL HYSTERECTOMY     with benign ovarian tumor removed.    Marland Kitchen FLEXIBLE BRONCHOSCOPY N/A 06/16/2016   Procedure: FLEXIBLE  BRONCHOSCOPY;  Surgeon: Allyne Gee, MD;  Location: [facility] ORS;  Service: Pulmonary;  Laterality: N/A;  . LUMBAR FUSION       Current Outpatient Prescriptions  Medication Sig Dispense Refill  . albuterol (PROAIR HFA) 108 (90 BASE) MCG/ACT inhaler Inhale 1 puff into the lungs every 6 (six) hours as needed for wheezing or shortness of breath.    Marland Kitchen apixaban (ELIQUIS) 2.5 MG TABS tablet Take 1 tablet (2.5 mg total) by mouth 2 (two) times daily. 60 tablet 3  . BREO ELLIPTA 100-25 MCG/INH AEPB INL 1 PUFF PO D  4  . cyanocobalamin (,VITAMIN B-12,) 1000 MCG/ML injection Inject 1 mL (1,000 mcg total) into the muscle every 30 (thirty) days. 10 mL 1  . HYDROcodone-acetaminophen (VICODIN) 5-325 mg TABS tablet One tablet q day to bid prn 40 tablet 0  . ipratropium-albuterol (DUONEB) 0.5-2.5 (3) MG/3ML SOLN Take 3 mLs by nebulization as needed.    . metoprolol (LOPRESSOR) 50 MG tablet Take 1 tablet (50 mg total) by mouth 2 (two) times daily. 60 tablet 3  . montelukast (SINGULAIR) 10 MG tablet Take 1 tablet (10 mg total) by mouth at bedtime. 90 tablet 1  . nitroGLYCERIN (NITROSTAT) 0.4 MG SL tablet Place 1 tablet (0.4 mg total) under the tongue every 5 (five) minutes as needed for chest pain. 25 tablet 2  . ondansetron (ZOFRAN) 4 MG tablet TK 1 T PO BID PRF NAUSEA  1  . spironolactone (ALDACTONE) 25 MG tablet TAKE 1  TABLET BY MOUTH DAILY 30 tablet 3  . triamcinolone ointment (KENALOG) 0.5 % Apply 1 application topically 2 (two) times daily. 30 g 0   No current facility-administered medications for this visit.     Allergies:   Penicillins; Augmentin [amoxicillin-pot clavulanate]; Evista [raloxifene]; Fosamax [alendronate sodium]; Lipitor [atorvastatin]; Miralax [polyethylene glycol]; and Zetia [ezetimibe]    Social History:  The patient  reports that she has quit smoking. Her smoking use included Cigarettes. She quit after 15.00 years of use. She has never used smokeless tobacco. She reports that she does  not drink alcohol or use drugs.   Family History:  The patient's family history includes Breast cancer in her sister; Cancer in her mother; Heart attack in her father; Heart disease in her father.    ROS:  Please see the history of present illness.   Otherwise, review of systems are positive for none.   All other systems are reviewed and negative.    PHYSICAL EXAM: VS:  BP 108/60 (BP Location: Left Arm, Patient Position: Sitting, Cuff Size: Normal)   Pulse 90   Ht '5\' 7"'$  (1.702 m)   Wt 114 lb (51.7 kg)   BMI 17.85 kg/m  , BMI Body mass index is 17.85 kg/m. GEN: Well nourished, well developed, in no acute distress  HEENT: normal  Neck: no JVD, carotid bruits, or masses Cardiac: Irregularly irregular; no murmurs, rubs, or gallops,no edema  Respiratory:  Bronchial breath sounds on the right side with diminished breath sounds, normal work of breathing GI: soft, nontender, nondistended, + BS MS: no deformity or atrophy  Skin: warm and dry, no rash Neuro:  Strength and sensation are intact Psych: euthymic mood, full affect   EKG:  EKG is  ordered today. The EKG showed atrial flutter with  variable AV conduction. Ventricular rate is 90 bpm.  Recent Labs: 07/22/2016: TSH 4.450 10/15/2016: ALT 10; BUN 19; Creatinine, Ser 1.29; Hemoglobin 11.6; Platelets 268; Potassium 3.8; Sodium 130    Lipid Panel    Component Value Date/Time   CHOL 202 (H) 11/19/2013 1108   TRIG 199.0 (H) 11/19/2013 1108   HDL 44.40 11/19/2013 1108   CHOLHDL 5 11/19/2013 1108   VLDL 39.8 11/19/2013 1108   LDLCALC 122 (H) 05/29/2013 0811   LDLDIRECT 131.1 11/19/2013 1108      Wt Readings from Last 3 Encounters:  10/25/16 114 lb (51.7 kg)  10/15/16 115 lb 11.2 oz (52.5 kg)  10/08/16 114 lb (51.7 kg)        ASSESSMENT AND PLAN:  1. Atrial flutter : Ventricular rate is no more controlled after switching to metoprolol. She is on anticoagulation with low dose Eliquis given her age and weight. Given her  significant lung disease, I'm hesitant to proceed with cardioversion. Ventricular rate is now reasonably controlled and I think it's reasonable to continue with rate control.  2.  Pneumonia: The patient is having worsening dyspnea with productive cough and chest x-ray findings suggestive of pneumonia. I prescribed Levaquin 250 mg once daily with 500 mg to be taken the first day. Her creatinine clearance is 26.  3. Essential hypertension:   Blood pressure is controlled.   Disposition:   FU with me in one month  Signed,  Kathlyn Sacramento, MD  10/25/2016 3:30 PM    [place]

## 2016-10-25 NOTE — Telephone Encounter (Signed)
-----   Message from Cammie Sickle, MD sent at 10/25/2016  4:32 PM EST ----- Please inform patient that I reviewed the chest x-ray concerning for pneumonia; and looks like she has been started on Levaquin by cardiology. Agree.  Recommend CT scan of the chest non contrast- few days before the next visit on the 19th Jan. I ordered the CT scan.

## 2016-10-25 NOTE — Telephone Encounter (Signed)
Can Dr. B look into this? It is his patient. If he would like me to order it I can do it.   Thanks, Astrid Divine

## 2016-10-25 NOTE — Telephone Encounter (Signed)
Cecille Rubin said her mom has been coughing up yellow sputum and has shortness of breath. They would like for her to have a chest xray if Dr. B agrees. She is going to Dr. Fletcher Anon today and they were hoping she might be able to do it while they are at the hospital. Can you please call Cecille Rubin back to tell her whether or not Dr. B would like her to do this? Thanks. [phone].

## 2016-10-25 NOTE — Patient Instructions (Addendum)
Serum creatinine: 1.29 mg/dL High 10/15/16 1310 Estimated creatinine clearance: 26 mL/min   Medication Instructions:  Your physician has recommended you make the following change in your medication:  START taking levaquin '250mg'$ . On the first day, tabke '500mg'$  (2 tablets) followed by one tablet once daily until you have finished the prescription.  START taking hydroxyzine '25mg'$  every 8 hours as needed for itching.    Labwork: none  Testing/Procedures: none  Follow-Up: Your physician recommends that you schedule a follow-up appointment in: one month with Dr. Fletcher Anon.    Any Other Special Instructions Will Be Listed Below (If Applicable).     If you need a refill on your cardiac medications before your next appointment, please call your pharmacy.

## 2016-10-25 NOTE — Telephone Encounter (Signed)
Asking that a CXR be ordered as her breathing  Is worse and it was offered at last appt to be done if she got worse. She wants it done this afternoon

## 2016-10-26 ENCOUNTER — Telehealth: Payer: Self-pay | Admitting: Cardiovascular Disease

## 2016-10-26 NOTE — Telephone Encounter (Signed)
Samples of Eliquis 2.'5mg'$  were given to the patient, quantity 3 boxes, Lot Number [id number] Pt notified; samples at front desk for pick up

## 2016-11-02 ENCOUNTER — Ambulatory Visit: Payer: Medicare Other | Admitting: Internal Medicine

## 2016-11-02 ENCOUNTER — Ambulatory Visit: Payer: Medicare Other

## 2016-11-03 ENCOUNTER — Ambulatory Visit: Admission: RE | Admit: 2016-11-03 | Payer: Medicare Other | Source: Ambulatory Visit

## 2016-11-04 ENCOUNTER — Ambulatory Visit: Admission: RE | Admit: 2016-11-04 | Payer: Medicare Other | Source: Ambulatory Visit

## 2016-11-05 ENCOUNTER — Inpatient Hospital Stay: Payer: Medicare Other

## 2016-11-05 ENCOUNTER — Inpatient Hospital Stay: Payer: Medicare Other | Attending: Internal Medicine | Admitting: Internal Medicine

## 2016-11-05 ENCOUNTER — Ambulatory Visit
Admission: RE | Admit: 2016-11-05 | Discharge: 2016-11-05 | Disposition: A | Discharge status: Home / Self Care | Payer: Medicare Other | Source: Ambulatory Visit | Attending: Internal Medicine | Admitting: Internal Medicine

## 2016-11-05 ENCOUNTER — Other Ambulatory Visit: Payer: Medicare Other

## 2016-11-05 VITALS — BP 112/72 | HR 71 | Temp 97.9°F | Wt 115.5 lb

## 2016-11-05 DIAGNOSIS — E039 Hypothyroidism, unspecified: Secondary | ICD-10-CM | POA: Diagnosis not present

## 2016-11-05 DIAGNOSIS — C3412 Malignant neoplasm of upper lobe, left bronchus or lung: Secondary | ICD-10-CM

## 2016-11-05 DIAGNOSIS — Z9981 Dependence on supplemental oxygen: Secondary | ICD-10-CM | POA: Diagnosis not present

## 2016-11-05 DIAGNOSIS — J90 Pleural effusion, not elsewhere classified: Secondary | ICD-10-CM | POA: Diagnosis not present

## 2016-11-05 DIAGNOSIS — I7789 Other specified disorders of arteries and arterioles: Secondary | ICD-10-CM | POA: Diagnosis not present

## 2016-11-05 DIAGNOSIS — I700 Atherosclerosis of aorta: Secondary | ICD-10-CM | POA: Insufficient documentation

## 2016-11-05 DIAGNOSIS — Z90710 Acquired absence of both cervix and uterus: Secondary | ICD-10-CM | POA: Insufficient documentation

## 2016-11-05 DIAGNOSIS — E871 Hypo-osmolality and hyponatremia: Secondary | ICD-10-CM

## 2016-11-05 DIAGNOSIS — M1990 Unspecified osteoarthritis, unspecified site: Secondary | ICD-10-CM | POA: Insufficient documentation

## 2016-11-05 DIAGNOSIS — K449 Diaphragmatic hernia without obstruction or gangrene: Secondary | ICD-10-CM | POA: Insufficient documentation

## 2016-11-05 DIAGNOSIS — I129 Hypertensive chronic kidney disease with stage 1 through stage 4 chronic kidney disease, or unspecified chronic kidney disease: Secondary | ICD-10-CM | POA: Diagnosis not present

## 2016-11-05 DIAGNOSIS — K219 Gastro-esophageal reflux disease without esophagitis: Secondary | ICD-10-CM | POA: Diagnosis not present

## 2016-11-05 DIAGNOSIS — E7800 Pure hypercholesterolemia, unspecified: Secondary | ICD-10-CM | POA: Insufficient documentation

## 2016-11-05 DIAGNOSIS — M818 Other osteoporosis without current pathological fracture: Secondary | ICD-10-CM | POA: Diagnosis not present

## 2016-11-05 DIAGNOSIS — J45909 Unspecified asthma, uncomplicated: Secondary | ICD-10-CM | POA: Insufficient documentation

## 2016-11-05 DIAGNOSIS — Z803 Family history of malignant neoplasm of breast: Secondary | ICD-10-CM

## 2016-11-05 DIAGNOSIS — J449 Chronic obstructive pulmonary disease, unspecified: Secondary | ICD-10-CM | POA: Diagnosis not present

## 2016-11-05 DIAGNOSIS — I4891 Unspecified atrial fibrillation: Secondary | ICD-10-CM

## 2016-11-05 DIAGNOSIS — D510 Vitamin B12 deficiency anemia due to intrinsic factor deficiency: Secondary | ICD-10-CM | POA: Insufficient documentation

## 2016-11-05 DIAGNOSIS — N189 Chronic kidney disease, unspecified: Secondary | ICD-10-CM | POA: Insufficient documentation

## 2016-11-05 DIAGNOSIS — Z87891 Personal history of nicotine dependence: Secondary | ICD-10-CM | POA: Insufficient documentation

## 2016-11-05 DIAGNOSIS — I2510 Atherosclerotic heart disease of native coronary artery without angina pectoris: Secondary | ICD-10-CM | POA: Insufficient documentation

## 2016-11-05 DIAGNOSIS — Z923 Personal history of irradiation: Secondary | ICD-10-CM

## 2016-11-05 DIAGNOSIS — E559 Vitamin D deficiency, unspecified: Secondary | ICD-10-CM | POA: Diagnosis not present

## 2016-11-05 DIAGNOSIS — Z8711 Personal history of peptic ulcer disease: Secondary | ICD-10-CM

## 2016-11-05 DIAGNOSIS — Z8619 Personal history of other infectious and parasitic diseases: Secondary | ICD-10-CM | POA: Insufficient documentation

## 2016-11-05 DIAGNOSIS — Z5111 Encounter for antineoplastic chemotherapy: Secondary | ICD-10-CM | POA: Insufficient documentation

## 2016-11-05 DIAGNOSIS — R918 Other nonspecific abnormal finding of lung field: Secondary | ICD-10-CM | POA: Insufficient documentation

## 2016-11-05 DIAGNOSIS — Z8719 Personal history of other diseases of the digestive system: Secondary | ICD-10-CM

## 2016-11-05 DIAGNOSIS — J189 Pneumonia, unspecified organism: Secondary | ICD-10-CM | POA: Diagnosis not present

## 2016-11-05 DIAGNOSIS — Z800 Family history of malignant neoplasm of digestive organs: Secondary | ICD-10-CM

## 2016-11-05 DIAGNOSIS — Z79899 Other long term (current) drug therapy: Secondary | ICD-10-CM | POA: Insufficient documentation

## 2016-11-05 LAB — CBC WITH DIFFERENTIAL/PLATELET
BASOS ABS: 0.1 10*3/uL (ref 0–0.1)
Basophils Relative: 1 %
EOS PCT: 14 %
Eosinophils Absolute: 0.8 10*3/uL — ABNORMAL HIGH (ref 0–0.7)
HEMATOCRIT: 36.8 % (ref 35.0–47.0)
Hemoglobin: 12.2 g/dL (ref 12.0–16.0)
LYMPHS ABS: 0.5 10*3/uL — AB (ref 1.0–3.6)
LYMPHS PCT: 8 %
MCH: 25.9 pg — AB (ref 26.0–34.0)
MCHC: 33.1 g/dL (ref 32.0–36.0)
MCV: 78.2 fL — AB (ref 80.0–100.0)
MONO ABS: 0.7 10*3/uL (ref 0.2–0.9)
MONOS PCT: 13 %
NEUTROS ABS: 3.6 10*3/uL (ref 1.4–6.5)
Neutrophils Relative %: 64 %
PLATELETS: 231 10*3/uL (ref 150–440)
RBC: 4.7 MIL/uL (ref 3.80–5.20)
RDW: 18.1 % — AB (ref 11.5–14.5)
WBC: 5.7 10*3/uL (ref 3.6–11.0)

## 2016-11-05 LAB — COMPREHENSIVE METABOLIC PANEL
ALT: 8 U/L — ABNORMAL LOW (ref 14–54)
AST: 17 U/L (ref 15–41)
Albumin: 3.5 g/dL (ref 3.5–5.0)
Alkaline Phosphatase: 91 U/L (ref 38–126)
Anion gap: 8 (ref 5–15)
BILIRUBIN TOTAL: 0.5 mg/dL (ref 0.3–1.2)
BUN: 21 mg/dL — AB (ref 6–20)
CHLORIDE: 100 mmol/L — AB (ref 101–111)
CO2: 25 mmol/L (ref 22–32)
CREATININE: 1.14 mg/dL — AB (ref 0.44–1.00)
Calcium: 8.9 mg/dL (ref 8.9–10.3)
GFR calc non Af Amer: 43 mL/min — ABNORMAL LOW (ref >60)
GFR, EST AFRICAN AMERICAN: 49 mL/min — AB (ref >60)
Glucose, Bld: 95 mg/dL (ref 65–99)
POTASSIUM: 4.2 mmol/L (ref 3.5–5.1)
Sodium: 133 mmol/L — ABNORMAL LOW (ref 135–145)
TOTAL PROTEIN: 6.8 g/dL (ref 6.5–8.1)

## 2016-11-05 MED ORDER — SODIUM CHLORIDE 0.9 % IV SOLN
200.0000 mg | Freq: Once | INTRAVENOUS | Status: AC
  Administered 2016-11-05: 200 mg via INTRAVENOUS
  Filled 2016-11-05: qty 8

## 2016-11-05 MED ORDER — SODIUM CHLORIDE 0.9 % IV SOLN
Freq: Once | INTRAVENOUS | Status: AC
  Administered 2016-11-05: 15:00:00 via INTRAVENOUS
  Filled 2016-11-05: qty 1000

## 2016-11-05 NOTE — Progress Notes (Signed)
[facility] NOTE  Patient Care Team: Einar Pheasant, MD as PCP - General (Internal Medicine) Wellington Hampshire, MD as Consulting Physician (Cardiology)  CHIEF COMPLAINTS/PURPOSE OF CONSULTATION: Lung cancer  #  Oncology History   # SEP 2017- SQUAMOUS CELL CA [s/p Bronch Dr.S Khan];PET - LEFT UPPER LOBE MASS- left upper lung collpase; mediastinal LN; Contralateral Right nodules [x3];STAGE IV [contralateral lung nodules]   # PDL-1- 90% [Keytruda]  # Hx of smoking/ COPD/ CKD [creat ~ 1.5]     Primary cancer of left upper lobe of lung ([place])   07/06/2016 Initial Diagnosis    Primary cancer of left upper lobe of lung ([facility])       HISTORY OF PRESENTING ILLNESS:  Alice Delgado 83 y.o.  female with newly diagnosed squamous cell lung cancer stage IV; currently Is currently finished radiation; Here to review the results of her restaging CAT scan.  Patient was seen by Dr. Janese Banks last on Dec 28th for cycle 3 of Keytruda. She called several days later with shortness of breath and productive cough and yellow sputum production. Chest x-ray ordered indicating PNA. She continues to be on 3-4 L of oxygen  No significant hemoptysis. Positive for cough. No fevers-especially the mornings.  No bone pain. No headaches. No hoarseness of voice. Denies any unusual tingling and numbness. No worsening headaches.  ROS: A complete 10 point review of system is done which is negative except mentioned above in history of present illness  MEDICAL HISTORY:  Past Medical History:  Diagnosis Date  . Asthma   . Diverticulosis   . GERD (gastroesophageal reflux disease)   . Hiatal hernia   . Hypercholesterolemia   . Hyperglycemia   . Hypertension   . Hypothyroidism   . Osteoarthritis    lumbar disc dz, cervical disc dz, hands  . Osteoporosis    intolerance to fosamax, evista and miacalcin  . Peptic ulcer disease   . Pernicious anemia   . Vitamin D deficiency     SURGICAL  HISTORY: Past Surgical History:  Procedure Laterality Date  . ABDOMINAL HYSTERECTOMY     with benign ovarian tumor removed.    Marland Kitchen FLEXIBLE BRONCHOSCOPY N/A 06/16/2016   Procedure: FLEXIBLE BRONCHOSCOPY;  Surgeon: Allyne Gee, MD;  Location: [facility] ORS;  Service: Pulmonary;  Laterality: N/A;  . LUMBAR FUSION      SOCIAL HISTORY:Retired Network engineer. Lives in [place]- 2 daughters; quit smoking in 1985. No alcohol. Social History   Social History  . Marital status: Widowed    Spouse name: N/A  . Number of children: 2  . Years of education: N/A   Occupational History  . Not on file.   Social History Main Topics  . Smoking status: Former Smoker    Years: 15.00    Types: Cigarettes  . Smokeless tobacco: Never Used  . Alcohol use No  . Drug use: No  . Sexual activity: Not on file   Other Topics Concern  . Not on file   Social History Narrative  . No narrative on file    FAMILY HISTORY: Family History  Problem Relation Age of Onset  . Heart disease Father     myocardial infarction  . Heart attack Father   . Cancer Mother     cancer of lymph nodes and kidney  . Breast cancer Sister   . Colon cancer Neg Hx     ALLERGIES:  is allergic to penicillins; augmentin [amoxicillin-pot clavulanate]; evista [raloxifene]; fosamax [alendronate sodium];  lipitor [atorvastatin]; miralax [polyethylene glycol]; and zetia [ezetimibe].  MEDICATIONS:  Current Outpatient Prescriptions  Medication Sig Dispense Refill  . albuterol (PROAIR HFA) 108 (90 BASE) MCG/ACT inhaler Inhale 1 puff into the lungs every 6 (six) hours as needed for wheezing or shortness of breath.    Marland Kitchen apixaban (ELIQUIS) 2.5 MG TABS tablet Take 1 tablet (2.5 mg total) by mouth 2 (two) times daily. 60 tablet 3  . BREO ELLIPTA 100-25 MCG/INH AEPB INL 1 PUFF PO D  4  . cyanocobalamin (,VITAMIN B-12,) 1000 MCG/ML injection Inject 1 mL (1,000 mcg total) into the muscle every 30 (thirty) days. 10 mL 1  . HYDROcodone-acetaminophen  (VICODIN) 5-325 mg TABS tablet One tablet q day to bid prn 40 tablet 0  . hydrOXYzine (ATARAX/VISTARIL) 25 MG tablet Take 1 tablet (25 mg total) by mouth 3 (three) times daily as needed for itching. 30 tablet 0  . ipratropium-albuterol (DUONEB) 0.5-2.5 (3) MG/3ML SOLN Take 3 mLs by nebulization as needed.    . metoprolol (LOPRESSOR) 50 MG tablet Take 1 tablet (50 mg total) by mouth 2 (two) times daily. 60 tablet 3  . montelukast (SINGULAIR) 10 MG tablet Take 1 tablet (10 mg total) by mouth at bedtime. 90 tablet 1  . nitroGLYCERIN (NITROSTAT) 0.4 MG SL tablet Place 1 tablet (0.4 mg total) under the tongue every 5 (five) minutes as needed for chest pain. 25 tablet 2  . ondansetron (ZOFRAN) 4 MG tablet TK 1 T PO BID PRF NAUSEA  1  . spironolactone (ALDACTONE) 25 MG tablet TAKE 1 TABLET BY MOUTH DAILY 30 tablet 3  . triamcinolone ointment (KENALOG) 0.5 % Apply 1 application topically 2 (two) times daily. 30 g 0   No current facility-administered medications for this visit.    Facility-Administered Medications Ordered in Other Visits  Medication Dose Route Frequency Provider Last Rate Last Dose  . pembrolizumab (KEYTRUDA) 200 mg in sodium chloride 0.9 % 50 mL chemo infusion  200 mg Intravenous Once Cammie Sickle, MD   Stopped at 11/05/16 1539      .  PHYSICAL EXAMINATION: ECOG PERFORMANCE STATUS: 1 - Symptomatic but completely ambulatory  Vitals:   11/05/16 1359  BP: 112/72  Pulse: 71  Temp: 97.9 F (36.6 C)   Filed Weights   11/05/16 1359  Weight: 115 lb 8 oz (52.4 kg)    GENERAL: Well-nourished well-developed; Alert, no distress and comfortable.  With a daughter.She is in a wheelchair. She is on 4 L of oxygen. EYES: no pallor or icterus OROPHARYNX: no thrush or ulceration; good dentition  NECK: supple, no masses felt LYMPH:  no palpable lymphadenopathy in the cervical, axillary or inguinal regions LUNGS: Decreased breath sounds on the left side compared to the right.   HEART/CVS: regular rate & rhythm and no murmurs; No lower extremity edema ABDOMEN: abdomen soft, non-tender and normal bowel sounds Musculoskeletal:no cyanosis of digits and no clubbing  PSYCH: alert & oriented x 3 with fluent speech NEURO: no focal motor/sensory deficits  LABORATORY DATA:  I have reviewed the data as listed Lab Results  Component Value Date   WBC 5.7 11/05/2016   HGB 12.2 11/05/2016   HCT 36.8 11/05/2016   MCV 78.2 (L) 11/05/2016   PLT 231 11/05/2016    Recent Labs  09/20/16 1323 10/15/16 1310 11/05/16 1329  NA 126* 130* 133*  K 3.7 3.8 4.2  CL 92* 99* 100*  CO2 25 25 25   GLUCOSE 147* 113* 95  BUN 23* 19 21*  CREATININE 1.39* 1.29* 1.14*  CALCIUM 8.8* 8.6* 8.9  GFRNONAA 34* 37* 43*  GFRAA 39* 43* 49*  PROT 7.2 6.3* 6.8  ALBUMIN 3.8 3.1* 3.5  AST 23 17 17   ALT 11* 10* 8*  ALKPHOS 83 86 91  BILITOT 0.7 0.4 0.5    RADIOGRAPHIC STUDIES: I have personally reviewed the radiological images as listed and agreed with the findings in the report. Dg Chest 2 View  Result Date: 10/25/2016 CLINICAL DATA:  Left lung malignancy with increase shortness of breath recently. Suspect pleural effusion. History of COPD. EXAM: CHEST  2 VIEW COMPARISON:  PA and lateral chest x-ray of September 20, 2016. FINDINGS: There is a small left pleural effusion which has increased in size. There is parenchymal density in the posterior aspect of the left lower lobe which is more conspicuous than in the past. There is subtle increased density in the right upper lobe. The heart is normal in size. The pulmonary vascularity is not engorged. There is calcification in the wall of the thoracic aorta. There is S shaped thoracolumbar scoliosis. IMPRESSION: Mild interval increase in the volume of the small right pleural effusion. New increased density in the superior segment of the left lower lobe is worrisome for recurrent malignancy though pneumonia could produce a similar picture. New increased  density in the right upper lobe consistent with pneumonia. Followup PA and lateral chest X-ray is recommended in 3-4 weeks following trial of antibiotic therapy to ensure resolution and exclude underlying malignancy. Electronically Signed   By: David  Martinique M.D.   On: 10/25/2016 14:39   Ct Chest Wo Contrast  Result Date: 11/05/2016 CLINICAL DATA:  Followup of pneumonia. Right-sided lung cancer diagnosed in August. Primary cancer of left upper lobe. EXAM: CT CHEST WITHOUT CONTRAST TECHNIQUE: Multidetector CT imaging of the chest was performed following the standard protocol without IV contrast. COMPARISON:  Plain film 10/25/2016. Most recent chest CT of 08/26/2016. PET of 06/14/2016. FINDINGS: Cardiovascular: Advanced aortic and branch vessel atherosclerosis. Mild cardiomegaly with multivessel Coronary artery atherosclerosis. Pulmonary artery enlargement, 3.3 cm outflow tract. Mediastinum/Nodes: No supraclavicular adenopathy. No mediastinal or definite hilar adenopathy, given limitations of unenhanced CT. Lungs/Pleura: Small left pleural effusion is slightly decreased since the prior CT. Biapical pleural-parenchymal scarring. Spiculated posterior right upper lobe lung nodule measures 2.6 x 1.8 cm on image 41/series 3. Felt to be similar to 2.4 x 2.4 cm on the prior. Immediately posterior lateral satellite 8 mm nodule is new. Superior segment right lower lobe spiculated nodule measures 2.1 x 1.2 cm on image 83/series 3. Compare 2.1 x 1.4 cm on the prior. Persistent minimal cavitation within. Posterior right upper lobe nodule measures 1.5 x 0.8 cm on image 49/series 3. Compare 1.3 x 1.0 cm on the prior. Decrease in soft tissue fullness about the left suprahilar region and medial left upper lobe. Measures on the order of 4.0 x 2.6 cm on image 59/series 2. Compare 6.1 x 6.0 cm on the prior. Improved aeration of left upper lobe endobronchial tree. The expansion of the previously collapsed medial left upper lobe. Mild  volume loss in the inferolateral left upper lobe remains. New left lower lobe patchy consolidation. Upper Abdomen: Normal imaged portions of the liver, spleen, stomach, pancreas, adrenal glands, kidneys. Advanced abdominal aortic atherosclerosis. Suspect gallstones. Musculoskeletal: Osteopenia. Subtle sclerosis involving the fifth anterior right rib is chronic and most likely related to remote trauma. S-shaped thoracolumbar spine curvature. IMPRESSION: 1. Further response to therapy of central left upper lobe lung mass.  2. New patchy left lower lobe consolidation. Favor infection. Sequelae of radiation therapy could look similar. 3. Primarily similar right-sided pulmonary nodules. A new satellite posterior right upper lobe nodule is likely indicative of metastatic disease. 4. Decrease in small left pleural effusion. 5. Probable cholelithiasis. 6. Pulmonary artery enlargement suggests pulmonary arterial hypertension. 7.  Coronary artery atherosclerosis. Aortic atherosclerosis. Electronically Signed   By: Abigail Miyamoto M.D.   On: 11/05/2016 14:53    ASSESSMENT & PLAN:   Primary cancer of left upper lobe of lung ([place]) # Squamous cell carcinoma of the left upper lobe with atelectasis left upper lung; and also contralateral lung nodules- stage IV lung cancer. S/p RT. November 2017 CT scan shows- improved left hilar mass; however increasing size of the contralateral lung lesions; increasing pleural effusion [s/p thora]  S/P keytruda cycle #3. CT scan-restaging pending today.   # proceed with cycle #4 of [place]. Lab reviewed; okay.   # SOB/PNA- Better today. Still on 3L continuous oxygen therapy. Chest X-ray 10/25/16 showed PNA. Treated with Levaquin.  Re-staging CT scan- family review shows left lower lobe pneumonia. Recommend holding off further antibiotics.  # Afib on eliquis.   # Hyponatremia- Improving  Sodium 133 today.  # Follow-up in 3 weeks cycle #5 of Keytruda/labs;    Faythe Casa, NP  I  personally interviewed and examined the patient. Agreed with the above plan of care. Patient/family questions were answered. Dr.Brahmanday MD    Cammie Sickle, MD 11/05/2016 3:26 PM

## 2016-11-05 NOTE — Assessment & Plan Note (Addendum)
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 S/p RT. November 2017 CT scan shows- improved left hilar mass; however increasing size of the contralateral lung lesions; increasing pleural effusion [s/p thora]  S/P keytruda cycle #3. CT scan-restaging pending today.   # proceed with cycle #4 of [place]. Lab reviewed; okay.   # SOB/PNA- Better today. Still on 3L continuous oxygen therapy. Chest X-ray 10/25/16 showed PNA. Treated with Levaquin.  Re-staging CT scan- family review shows left lower lobe pneumonia. Recommend holding off further antibiotics.  # Afib on eliquis.   # Hyponatremia- Improving  Sodium 133 today.  # Follow-up in 3 weeks cycle #5 of Keytruda/labs;

## 2016-11-05 NOTE — Progress Notes (Signed)
Patient here today for follow up.  Patient states that she still feels congestion in her chest from recent dx of pneumonia

## 2016-11-05 NOTE — Progress Notes (Signed)
[facility] NOTE  Patient Care Team: Einar Pheasant, MD as PCP - General (Internal Medicine) Wellington Hampshire, MD as Consulting Physician (Cardiology)  CHIEF COMPLAINTS/PURPOSE OF CONSULTATION: Lung cancer  #  Oncology History   # SEP 2017- SQUAMOUS CELL CA [s/p Bronch Dr.S Khan];PET - LEFT UPPER LOBE MASS- left upper lung collpase; mediastinal LN; Contralateral Right nodules [x3];STAGE IV [contralateral lung nodules]   # PDL-1- 90% [Keytruda]  # Hx of smoking/ COPD/ CKD [creat ~ 1.5]     Primary cancer of left upper lobe of lung ([place])   07/06/2016 Initial Diagnosis    Primary cancer of left upper lobe of lung ([facility])       HISTORY OF PRESENTING ILLNESS:  Alice Delgado 83 y.o.  female with newly diagnosed squamous cell lung cancer stage IV; currently Is currently finished radiation; Here to review the results of her restaging CAT scan.  Patient had a thoracentesis of the left side with no significant improvement of her breathing. She continues to be on 3-4 L of oxygen  No significant hemoptysis. Positive for cough. No fevers-especially the mornings.  No bone pain. No headaches. No hoarseness of voice. Denies any unusual tingling and numbness. No worsening headaches.  ROS: A complete 10 point review of system is done which is negative except mentioned above in history of present illness  MEDICAL HISTORY:  Past Medical History:  Diagnosis Date  . Asthma   . Diverticulosis   . GERD (gastroesophageal reflux disease)   . Hiatal hernia   . Hypercholesterolemia   . Hyperglycemia   . Hypertension   . Hypothyroidism   . Osteoarthritis    lumbar disc dz, cervical disc dz, hands  . Osteoporosis    intolerance to fosamax, evista and miacalcin  . Peptic ulcer disease   . Pernicious anemia   . Vitamin D deficiency     SURGICAL HISTORY: Past Surgical History:  Procedure Laterality Date  . ABDOMINAL HYSTERECTOMY     with benign ovarian tumor removed.     Marland Kitchen FLEXIBLE BRONCHOSCOPY N/A 06/16/2016   Procedure: FLEXIBLE BRONCHOSCOPY;  Surgeon: Allyne Gee, MD;  Location: [facility] ORS;  Service: Pulmonary;  Laterality: N/A;  . LUMBAR FUSION      SOCIAL HISTORY:Retired Network engineer. Lives in [place]- 2 daughters; quit smoking in 1985. No alcohol. Social History   Social History  . Marital status: Widowed    Spouse name: N/A  . Number of children: 2  . Years of education: N/A   Occupational History  . Not on file.   Social History Main Topics  . Smoking status: Former Smoker    Years: 15.00    Types: Cigarettes  . Smokeless tobacco: Never Used  . Alcohol use No  . Drug use: No  . Sexual activity: Not on file   Other Topics Concern  . Not on file   Social History Narrative  . No narrative on file    FAMILY HISTORY: Family History  Problem Relation Age of Onset  . Heart disease Father     myocardial infarction  . Heart attack Father   . Cancer Mother     cancer of lymph nodes and kidney  . Breast cancer Sister   . Colon cancer Neg Hx     ALLERGIES:  is allergic to penicillins; augmentin [amoxicillin-pot clavulanate]; evista [raloxifene]; fosamax [alendronate sodium]; lipitor [atorvastatin]; miralax [polyethylene glycol]; and zetia [ezetimibe].  MEDICATIONS:  Current Outpatient Prescriptions  Medication Sig Dispense Refill  .  albuterol (PROAIR HFA) 108 (90 BASE) MCG/ACT inhaler Inhale 1 puff into the lungs every 6 (six) hours as needed for wheezing or shortness of breath.    Marland Kitchen apixaban (ELIQUIS) 2.5 MG TABS tablet Take 1 tablet (2.5 mg total) by mouth 2 (two) times daily. 60 tablet 3  . BREO ELLIPTA 100-25 MCG/INH AEPB INL 1 PUFF PO D  4  . cyanocobalamin (,VITAMIN B-12,) 1000 MCG/ML injection Inject 1 mL (1,000 mcg total) into the muscle every 30 (thirty) days. 10 mL 1  . HYDROcodone-acetaminophen (VICODIN) 5-325 mg TABS tablet One tablet q day to bid prn 40 tablet 0  . hydrOXYzine (ATARAX/VISTARIL) 25 MG tablet Take 1 tablet  (25 mg total) by mouth 3 (three) times daily as needed for itching. 30 tablet 0  . ipratropium-albuterol (DUONEB) 0.5-2.5 (3) MG/3ML SOLN Take 3 mLs by nebulization as needed.    . metoprolol (LOPRESSOR) 50 MG tablet Take 1 tablet (50 mg total) by mouth 2 (two) times daily. 60 tablet 3  . montelukast (SINGULAIR) 10 MG tablet Take 1 tablet (10 mg total) by mouth at bedtime. 90 tablet 1  . nitroGLYCERIN (NITROSTAT) 0.4 MG SL tablet Place 1 tablet (0.4 mg total) under the tongue every 5 (five) minutes as needed for chest pain. 25 tablet 2  . ondansetron (ZOFRAN) 4 MG tablet TK 1 T PO BID PRF NAUSEA  1  . spironolactone (ALDACTONE) 25 MG tablet TAKE 1 TABLET BY MOUTH DAILY 30 tablet 3  . triamcinolone ointment (KENALOG) 0.5 % Apply 1 application topically 2 (two) times daily. 30 g 0   No current facility-administered medications for this visit.       Marland Kitchen  PHYSICAL EXAMINATION: ECOG PERFORMANCE STATUS: 1 - Symptomatic but completely ambulatory  There were no vitals filed for this visit. There were no vitals filed for this visit.  GENERAL: Well-nourished well-developed; Alert, no distress and comfortable.  With a daughter.She is in a wheelchair. She is on 4 L of oxygen. EYES: no pallor or icterus OROPHARYNX: no thrush or ulceration; good dentition  NECK: supple, no masses felt LYMPH:  no palpable lymphadenopathy in the cervical, axillary or inguinal regions LUNGS: Decreased breath sounds on the left side compared to the right.  HEART/CVS: regular rate & rhythm and no murmurs; No lower extremity edema ABDOMEN: abdomen soft, non-tender and normal bowel sounds Musculoskeletal:no cyanosis of digits and no clubbing  PSYCH: alert & oriented x 3 with fluent speech NEURO: no focal motor/sensory deficits  LABORATORY DATA:  I have reviewed the data as listed Lab Results  Component Value Date   WBC 5.7 11/05/2016   HGB 12.2 11/05/2016   HCT 36.8 11/05/2016   MCV 78.2 (L) 11/05/2016   PLT 231  11/05/2016    Recent Labs  08/30/16 1300 09/08/16 1408 09/20/16 1323 10/15/16 1310  NA 127* 126* 126* 130*  K 3.8 4.0 3.7 3.8  CL 94* 92* 92* 99*  CO2 25 24 25 25   GLUCOSE 110* 106* 147* 113*  BUN 17 21* 23* 19  CREATININE 1.32* 1.46* 1.39* 1.29*  CALCIUM 8.8* 8.9 8.8* 8.6*  GFRNONAA 36* 32* 34* 37*  GFRAA 41* 37* 39* 43*  PROT 6.7  --  7.2 6.3*  ALBUMIN 3.4*  --  3.8 3.1*  AST 20  --  23 17  ALT 9*  --  11* 10*  ALKPHOS 70  --  83 86  BILITOT 0.4  --  0.7 0.4    RADIOGRAPHIC STUDIES: I have personally  reviewed the radiological images as listed and agreed with the findings in the report. Dg Chest 2 View  Result Date: 10/25/2016 CLINICAL DATA:  Left lung malignancy with increase shortness of breath recently. Suspect pleural effusion. History of COPD. EXAM: CHEST  2 VIEW COMPARISON:  PA and lateral chest x-ray of September 20, 2016. FINDINGS: There is a small left pleural effusion which has increased in size. There is parenchymal density in the posterior aspect of the left lower lobe which is more conspicuous than in the past. There is subtle increased density in the right upper lobe. The heart is normal in size. The pulmonary vascularity is not engorged. There is calcification in the wall of the thoracic aorta. There is S shaped thoracolumbar scoliosis. IMPRESSION: Mild interval increase in the volume of the small right pleural effusion. New increased density in the superior segment of the left lower lobe is worrisome for recurrent malignancy though pneumonia could produce a similar picture. New increased density in the right upper lobe consistent with pneumonia. Followup PA and lateral chest X-ray is recommended in 3-4 weeks following trial of antibiotic therapy to ensure resolution and exclude underlying malignancy. Electronically Signed   By: David  Martinique M.D.   On: 10/25/2016 14:39    ASSESSMENT & PLAN:   Primary cancer of left upper lobe of lung ([place]) # Squamous cell carcinoma  of the left upper lobe with atelectasis left upper lung; and also contralateral lung nodules- stage IV lung cancer. S/p RT. November 2017 CT scan shows- improved left hilar mass; however increasing size of the contralateral lung lesions; increasing pleural effusion [s/p thora]  S/P keytruda cycle #1.   # Afib on eliquis. HOLD for thoracentesis x2 days; re-start   # Hyponatremia- ? Sec to [employer]; increasing salt intake. Sodium 126. .  # worsening SOB- s/p thoracentesis- no significant improvement noted. Likely secondary to progressive malignancy atelectasis left upper lobe.   # Follow-up in 3 weeks cycle #3 of Keytruda/labs;     Cammie Sickle, MD 11/05/2016 2:01 PM

## 2016-11-07 NOTE — Progress Notes (Signed)
[facility] Consultation      Assessment and Plan:  Left upper lobe non-small cell lung cancer, stage IV with metastases to the opposite lung. --Continue keytruda per oncology.  -Reviewed. CT chest images did not see evidence of pneumonia at this time, there does appear to be scarring/interstitial changes in the left lung which may be secondary to cancer versus  fibrosis from radiation.  Left pleural effusion. --Small to moderate pleural effusion.   COPD.  Atrial flutter.  -Discussed that any of her pulmonary meds can increase her heart rate, will need to monitor this.    Date: 11/07/2016  MRN# [id number] Alice Delgado April 27, 1929  Referring Physician:   ENJOLI Delgado is a 83 y.o. old female seen in consultation for chief complaint of:    Chief Complaint  Patient presents with  . Advice Only    ref Dr.Scott: SOB at all times: wheezing; prod cough w/yellow mucus:     HPI:   Alice Delgado 83 y.o.  female with newly diagnosed squamous cell lung cancer stage IV, s/p radiation to the LUL, and thoracentesis of left pleural effusion.   She has been having persistent dyspnea.   She notes that she has tightness in her chest with difficulty breathing, which has been present for the past 2 years, and it has been progressively worse. She is currently using 3L at home constant, and 4L with pulse dosing when leaving the home. She thinks that it helps with her breathing.   She is had a prescription for breo but has been out of it for about a week. She has nebs at home but has run out of it.  She last smoked in the mid 80's She lives with her daughter who is also present and gives part of history.   Review of CT chest images from 11/05/16; decrease in LUL atelectasis/mass, left hilar mass, and left pleural effusion. However there is new interstitial changes in the LLL.   Heme/Onc history:   # SEP 2017- SQUAMOUS CELL CA [s/p Bronch Dr.S Khan];PET - LEFT  UPPER LOBE MASS- left upper lung collpase; mediastinal LN; Contralateral Right nodules [x3];STAGE IV [contralateral lung nodules]   # PDL-1- 90% [Keytruda]  # Hx of smoking/ COPD/ CKD [creat ~ 1.5]      PMHX:   Past Medical History:  Diagnosis Date  . Asthma   . Diverticulosis   . GERD (gastroesophageal reflux disease)   . Hiatal hernia   . Hypercholesterolemia   . Hyperglycemia   . Hypertension   . Hypothyroidism   . Osteoarthritis    lumbar disc dz, cervical disc dz, hands  . Osteoporosis    intolerance to fosamax, evista and miacalcin  . Peptic ulcer disease   . Pernicious anemia   . Vitamin D deficiency    Surgical Hx:  Past Surgical History:  Procedure Laterality Date  . ABDOMINAL HYSTERECTOMY     with benign ovarian tumor removed.    Marland Kitchen FLEXIBLE BRONCHOSCOPY N/A 06/16/2016   Procedure: FLEXIBLE BRONCHOSCOPY;  Surgeon: Allyne Gee, MD;  Location: [facility] ORS;  Service: Pulmonary;  Laterality: N/A;  . LUMBAR FUSION     Family Hx:  Family History  Problem Relation Age of Onset  . Heart disease Father     myocardial infarction  . Heart attack Father   . Cancer Mother     cancer of lymph nodes and kidney  . Breast cancer Sister   . Colon cancer Neg Hx  Social Hx:   Social History  Substance Use Topics  . Smoking status: Former Smoker    Years: 15.00    Types: Cigarettes  . Smokeless tobacco: Never Used  . Alcohol use No   Medication:   Reviewed.     Allergies:  Penicillins; Augmentin [amoxicillin-pot clavulanate]; Evista [raloxifene]; Fosamax [alendronate sodium]; Lipitor [atorvastatin]; Miralax [polyethylene glycol]; and Zetia [ezetimibe]  Review of Systems: Gen:  Denies  fever, sweats, chills HEENT: Denies blurred vision, double vision. bleeds, sore throat Cvc:  No dizziness, chest pain. Resp:   Denies cough or sputum production, shortness of breath Gi: Denies swallowing difficulty, stomach pain. Gu:  Denies bladder incontinence, burning  urine Ext:   No Joint pain, stiffness. Skin: No skin rash,  hives  Endoc:  No polyuria, polydipsia. Psych: No depression, insomnia. Other:  All other systems were reviewed with the patient and were negative other that what is mentioned in the HPI.   Physical Examination:   VS: There were no vitals taken for this visit.  General Appearance: No distress  Neuro:without focal findings,  speech normal,  HEENT: PERRLA, EOM intact.   Pulmonary: normal breath sounds, No wheezing.  CardiovascularNormal S1,S2.  No m/r/g.   Abdomen: Benign, Soft, non-tender. Renal:  No costovertebral tenderness  GU:  No performed at this time. Endoc: No evident thyromegaly, no signs of acromegaly. Skin:   warm, no rashes, no ecchymosis  Extremities: normal, no cyanosis, clubbing.  Other findings:    LABORATORY PANEL:   CBC  Recent Labs Lab 11/05/16 1329  WBC 5.7  HGB 12.2  HCT 36.8  PLT 231   ------------------------------------------------------------------------------------------------------------------  Chemistries   Recent Labs Lab 11/05/16 1329  NA 133*  K 4.2  CL 100*  CO2 25  GLUCOSE 95  BUN 21*  CREATININE 1.14*  CALCIUM 8.9  AST 17  ALT 8*  ALKPHOS 91  BILITOT 0.5   ------------------------------------------------------------------------------------------------------------------  Cardiac Enzymes No results for input(s): TROPONINI in the last 168 hours. ------------------------------------------------------------  RADIOLOGY:  No results found.     Thank  you for the consultation and for allowing [facility] to assist in the care of your patient. Our recommendations are noted above.  Please contact us if we can be of further service.   Marda Stalker, MD.  Board Certified in Internal Medicine, Pulmonary Medicine, [place], and Sleep Medicine.  [facility] Office Number: [phone]  Patricia Pesa,  M.D.  Vilinda Boehringer, M.D.  Merton Border, M.D  11/07/2016

## 2016-11-08 ENCOUNTER — Ambulatory Visit (INDEPENDENT_AMBULATORY_CARE_PROVIDER_SITE_OTHER): Payer: Medicare Other | Admitting: Internal Medicine

## 2016-11-08 ENCOUNTER — Institutional Professional Consult (permissible substitution): Payer: Medicare Other | Admitting: Internal Medicine

## 2016-11-08 ENCOUNTER — Encounter: Payer: Self-pay | Admitting: Internal Medicine

## 2016-11-08 VITALS — BP 110/66 | HR 66

## 2016-11-08 DIAGNOSIS — J438 Other emphysema: Secondary | ICD-10-CM

## 2016-11-08 DIAGNOSIS — C3412 Malignant neoplasm of upper lobe, left bronchus or lung: Secondary | ICD-10-CM

## 2016-11-08 DIAGNOSIS — J90 Pleural effusion, not elsewhere classified: Secondary | ICD-10-CM

## 2016-11-08 DIAGNOSIS — J701 Chronic and other pulmonary manifestations due to radiation: Secondary | ICD-10-CM

## 2016-11-08 MED ORDER — ALBUTEROL SULFATE (2.5 MG/3ML) 0.083% IN NEBU: 2.5000 mg | INHALATION_SOLUTION | Freq: Two times a day (BID) | RESPIRATORY_TRACT | 12 refills | Status: AC

## 2016-11-08 MED ORDER — TIOTROPIUM BROMIDE MONOHYDRATE 18 MCG IN CAPS: 18.0000 ug | ORAL_CAPSULE | Freq: Every day | RESPIRATORY_TRACT | 5 refills | Status: DC

## 2016-11-08 MED ORDER — BREO ELLIPTA 100-25 MCG/INH IN AEPB: INHALATION_SPRAY | RESPIRATORY_TRACT | 4 refills | Status: DC

## 2016-11-08 NOTE — Addendum Note (Signed)
Addended by: Oscar La R on: 11/08/2016 03:22 PM   Modules accepted: Orders

## 2016-11-08 NOTE — Patient Instructions (Addendum)
--  Restart Breo 200 once daily, rinse mouth after use.   --Albuterol for nebulizer two times daily. You may use this more often if needed for dyspnea.   --Spiriva once daily.   --Keep an eye on your heart rate, if it goes up past 110 sustained, then stop nebulizer treatment.

## 2016-11-09 ENCOUNTER — Encounter: Payer: Self-pay | Admitting: *Deleted

## 2016-11-13 DIAGNOSIS — J449 Chronic obstructive pulmonary disease, unspecified: Secondary | ICD-10-CM | POA: Diagnosis not present

## 2016-11-18 ENCOUNTER — Encounter: Payer: Self-pay | Admitting: Internal Medicine

## 2016-11-18 ENCOUNTER — Telehealth: Payer: Self-pay | Admitting: Internal Medicine

## 2016-11-18 ENCOUNTER — Telehealth: Payer: Self-pay | Admitting: *Deleted

## 2016-11-18 DIAGNOSIS — J449 Chronic obstructive pulmonary disease, unspecified: Secondary | ICD-10-CM | POA: Diagnosis not present

## 2016-11-18 DIAGNOSIS — C3412 Malignant neoplasm of upper lobe, left bronchus or lung: Secondary | ICD-10-CM

## 2016-11-18 MED ORDER — AZITHROMYCIN 250 MG PO TABS: ORAL_TABLET | ORAL | 0 refills | Status: AC

## 2016-11-18 NOTE — Telephone Encounter (Signed)
Her breathing is more labored at times but not continuously. She wants to know if she should get another antibiotic?

## 2016-11-18 NOTE — Telephone Encounter (Signed)
You seen pt 11/08/16. Please see message below and advise.  Patient still has same feeling she had when she was sick with pneumonia.  Still has productive cough yellow sputum.  Patient has tightness in chest not sure if related to lung cancer or if this present illness.

## 2016-11-18 NOTE — Telephone Encounter (Signed)
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 Zpak sent to pharmacy and informed daughter if pt gets no better then she will have to make a f/u appt to be seen. Daughter verbalized understanding. Nothing further needed.

## 2016-11-18 NOTE — Telephone Encounter (Signed)
Advised to call pulmonologist regarding this as Dr Rogue Bussing is off this afternoon. She stated she will try , but still wants Dr B to know about and call her tomorrow

## 2016-11-18 NOTE — Telephone Encounter (Signed)
Patient still has same feeling she had when she was sick with pneumonia.  Still has productive cough yellow sputum.  Patient has tightness in chest not sure if related to lung cancer or if this present illness.  Please call daughter Alice Delgado [phone].

## 2016-11-18 NOTE — Telephone Encounter (Signed)
At last visit she was complaining of the same thing, her CT did not show pneumonia. I asked her to restart her Breo and nebulizer treatments which she had not been taking. Has she been taking these? If not she should start them as discussed at last visit.  If she is taking these and having NEW dyspnea from her last visit then we can send in a script for zpack.

## 2016-11-18 NOTE — Telephone Encounter (Signed)
Called to report that patient is having same symptoms as a few weeks ago when she had pneumonia, increased difficulty breathing productive cough yellow phlegm. Asking if you want to get CXR or start on abx. Please advise

## 2016-11-19 NOTE — Addendum Note (Signed)
Addended by: Sabino Gasser on: 11/19/2016 09:04 AM   Modules accepted: Orders

## 2016-11-19 NOTE — Telephone Encounter (Signed)
Dr. Rogue Bussing made aware of patient's concerns. reviewed chart. Patient now being tx under the care of pulmonology. Dr. Rogue Bussing stated that if he would approve a f/u chest xray. Orders entered. Triage made aware.

## 2016-11-26 ENCOUNTER — Other Ambulatory Visit: Payer: Medicare Other

## 2016-11-26 ENCOUNTER — Encounter: Payer: Self-pay | Admitting: Cardiovascular Disease

## 2016-11-26 ENCOUNTER — Ambulatory Visit (INDEPENDENT_AMBULATORY_CARE_PROVIDER_SITE_OTHER): Payer: Medicare Other | Admitting: Cardiovascular Disease

## 2016-11-26 ENCOUNTER — Ambulatory Visit: Payer: Medicare Other

## 2016-11-26 ENCOUNTER — Ambulatory Visit: Payer: Medicare Other | Admitting: Internal Medicine

## 2016-11-26 VITALS — BP 138/82 | HR 82 | Ht 61.0 in | Wt 119.5 lb

## 2016-11-26 DIAGNOSIS — I10 Essential (primary) hypertension: Secondary | ICD-10-CM | POA: Diagnosis not present

## 2016-11-26 DIAGNOSIS — I4892 Unspecified atrial flutter: Secondary | ICD-10-CM

## 2016-11-26 NOTE — Progress Notes (Signed)
Cardiology Office Note   Date:  11/26/2016   ID:  ZYIONNA PESCE, DOB November 21, 1928, MRN [id number]  PCP:  Einar Pheasant, MD  Cardiologist:   Kathlyn Sacramento, MD   Chief Complaint  Patient presents with  . other    1 month follow up. Meds reviewed by the pt. verbally. Pt. c/o shortness of breath but no more than usual.       History of Present Illness: Alice Delgado is a 83 y.o. female who Is here today for a follow-up visit regarding atrial flutter.  She has chronic medical conditions that include hypertension, hyperlipidemia, chronic back pain, hypothyroidism and asthma.   She is known to have refractory hypertension with negative workup for secondary hypertension.  She was diagnosed with stage IV lung cancer and currently getting palliative chemotherapy.  She was diagnosed with atrial flutter with RVR few months ago. She did not tolerate diltiazem very well due to nausea. Echocardiogram fortunately showed normal LV systolic function with mild to moderate tricuspid regurgitation.  Ventricular rate has been reasonably controlled on metoprolol 50 mg twice daily. Overall she feels better and she gained a few pounds with improved appetite. She continues to receive chemotherapy for lung cancer. No side effects with anticoagulation.  Past Medical History:  Diagnosis Date  . Asthma   . Diverticulosis   . GERD (gastroesophageal reflux disease)   . Hiatal hernia   . Hypercholesterolemia   . Hyperglycemia   . Hypertension   . Hypothyroidism   . Osteoarthritis    lumbar disc dz, cervical disc dz, hands  . Osteoporosis    intolerance to fosamax, evista and miacalcin  . Peptic ulcer disease   . Pernicious anemia   . Vitamin D deficiency     Past Surgical History:  Procedure Laterality Date  . ABDOMINAL HYSTERECTOMY     with benign ovarian tumor removed.    Marland Kitchen FLEXIBLE BRONCHOSCOPY N/A 06/16/2016   Procedure: FLEXIBLE BRONCHOSCOPY;  Surgeon: Allyne Gee, MD;  Location:  [facility] ORS;  Service: Pulmonary;  Laterality: N/A;  . LUMBAR FUSION       Current Outpatient Prescriptions  Medication Sig Dispense Refill  . albuterol (PROAIR HFA) 108 (90 BASE) MCG/ACT inhaler Inhale 1 puff into the lungs every 6 (six) hours as needed for wheezing or shortness of breath.    Marland Kitchen albuterol (PROVENTIL) (2.5 MG/3ML) 0.083% nebulizer solution Take 3 mLs (2.5 mg total) by nebulization 2 (two) times daily. DX: EMPHYSEMA DX CODE: J43.8 75 mL 12  . apixaban (ELIQUIS) 2.5 MG TABS tablet Take 1 tablet (2.5 mg total) by mouth 2 (two) times daily. 60 tablet 3  . BREO ELLIPTA 100-25 MCG/INH AEPB INHALE 1 PUFF BY MOUTH DAILY 30 each 4  . cyanocobalamin (,VITAMIN B-12,) 1000 MCG/ML injection Inject 1 mL (1,000 mcg total) into the muscle every 30 (thirty) days. 10 mL 1  . HYDROcodone-acetaminophen (VICODIN) 5-325 mg TABS tablet One tablet q day to bid prn 40 tablet 0  . hydrOXYzine (ATARAX/VISTARIL) 25 MG tablet Take 1 tablet (25 mg total) by mouth 3 (three) times daily as needed for itching. 30 tablet 0  . ipratropium-albuterol (DUONEB) 0.5-2.5 (3) MG/3ML SOLN Take 3 mLs by nebulization as needed.    . metoprolol (LOPRESSOR) 50 MG tablet Take 1 tablet (50 mg total) by mouth 2 (two) times daily. 60 tablet 3  . montelukast (SINGULAIR) 10 MG tablet Take 1 tablet (10 mg total) by mouth at bedtime. 90 tablet 1  . nitroGLYCERIN (  NITROSTAT) 0.4 MG SL tablet Place 1 tablet (0.4 mg total) under the tongue every 5 (five) minutes as needed for chest pain. 25 tablet 2  . ondansetron (ZOFRAN) 4 MG tablet TK 1 T PO BID PRF NAUSEA  1  . spironolactone (ALDACTONE) 25 MG tablet TAKE 1 TABLET BY MOUTH DAILY 30 tablet 3  . tiotropium (SPIRIVA) 18 MCG inhalation capsule Place 1 capsule (18 mcg total) into inhaler and inhale daily. 30 capsule 5  . triamcinolone ointment (KENALOG) 0.5 % Apply 1 application topically 2 (two) times daily. 30 g 0   No current facility-administered medications for this visit.      Allergies:   Penicillins; Augmentin [amoxicillin-pot clavulanate]; Evista [raloxifene]; Fosamax [alendronate sodium]; Lipitor [atorvastatin]; Miralax [polyethylene glycol]; and Zetia [ezetimibe]    Social History:  The patient  reports that she has quit smoking. Her smoking use included Cigarettes. She quit after 15.00 years of use. She has never used smokeless tobacco. She reports that she does not drink alcohol or use drugs.   Family History:  The patient's family history includes Breast cancer in her sister; Cancer in her mother; Heart attack in her father; Heart disease in her father.    ROS:  Please see the history of present illness.   Otherwise, review of systems are positive for none.   All other systems are reviewed and negative.    PHYSICAL EXAM: VS:  BP 138/82 (BP Location: Right Arm, Patient Position: Sitting, Cuff Size: Normal)   Pulse 82   Ht '5\' 1"'$  (1.549 m)   Wt 119 lb 8 oz (54.2 kg)   BMI 22.58 kg/m  , BMI Body mass index is 22.58 kg/m. GEN: Well nourished, well developed, in no acute distress  HEENT: normal  Neck: no JVD, carotid bruits, or masses Cardiac: Irregularly irregular; no murmurs, rubs, or gallops,no edema  Respiratory:  Diminished breath sounds at the right base otherwise clear. normal work of breathing GI: soft, nontender, nondistended, + BS MS: no deformity or atrophy  Skin: warm and dry, no rash Neuro:  Strength and sensation are intact Psych: euthymic mood, full affect   EKG:  EKG is  ordered today. The EKG showed atrial flutter with  variable AV conduction. Ventricular rate is 82 bpm  Recent Labs: 07/22/2016: TSH 4.450 11/05/2016: ALT 8; BUN 21; Creatinine, Ser 1.14; Hemoglobin 12.2; Platelets 231; Potassium 4.2; Sodium 133    Lipid Panel    Component Value Date/Time   CHOL 202 (H) 11/19/2013 1108   TRIG 199.0 (H) 11/19/2013 1108   HDL 44.40 11/19/2013 1108   CHOLHDL 5 11/19/2013 1108   VLDL 39.8 11/19/2013 1108   LDLCALC 122 (H)  05/29/2013 0811   LDLDIRECT 131.1 11/19/2013 1108      Wt Readings from Last 3 Encounters:  11/26/16 119 lb 8 oz (54.2 kg)  11/05/16 115 lb 8 oz (52.4 kg)  10/25/16 114 lb (51.7 kg)        ASSESSMENT AND PLAN:  1. Chronic atrial flutter : Ventricular rate is well controlled on metoprolol. She is on anticoagulation with low dose Eliquis given her age and weight. Given her significant lung disease, she is at high risk for moderate sedation and thus I'm going to avoid cardioversion.  3. Essential hypertension:   Blood pressure is controlled but is starting to go up. Monitor closely for now.   Disposition:   FU with me in 3 months  Signed,  Kathlyn Sacramento, MD  11/26/2016 2:37 PM  [facility]

## 2016-11-26 NOTE — Patient Instructions (Signed)
Medication Instructions: Continue same medications.   Labwork: None.   Procedures/Testing: None.   Follow-Up: 3 months with Dr. Ashlyne Olenick.   Any Additional Special Instructions Will Be Listed Below (If Applicable).     If you need a refill on your cardiac medications before your next appointment, please call your pharmacy.   

## 2016-11-30 ENCOUNTER — Other Ambulatory Visit: Payer: Self-pay | Admitting: Cardiovascular Disease

## 2016-12-01 ENCOUNTER — Inpatient Hospital Stay: Payer: Medicare Other

## 2016-12-01 ENCOUNTER — Inpatient Hospital Stay: Payer: Medicare Other | Attending: Internal Medicine | Admitting: Internal Medicine

## 2016-12-01 VITALS — BP 143/69 | HR 77 | Temp 99.2°F | Resp 16

## 2016-12-01 VITALS — BP 127/67 | HR 57 | Temp 97.8°F | Wt 119.5 lb

## 2016-12-01 DIAGNOSIS — K219 Gastro-esophageal reflux disease without esophagitis: Secondary | ICD-10-CM | POA: Diagnosis not present

## 2016-12-01 DIAGNOSIS — E7800 Pure hypercholesterolemia, unspecified: Secondary | ICD-10-CM | POA: Diagnosis not present

## 2016-12-01 DIAGNOSIS — Z880 Allergy status to penicillin: Secondary | ICD-10-CM | POA: Insufficient documentation

## 2016-12-01 DIAGNOSIS — I700 Atherosclerosis of aorta: Secondary | ICD-10-CM | POA: Diagnosis not present

## 2016-12-01 DIAGNOSIS — Z9981 Dependence on supplemental oxygen: Secondary | ICD-10-CM | POA: Insufficient documentation

## 2016-12-01 DIAGNOSIS — M1990 Unspecified osteoarthritis, unspecified site: Secondary | ICD-10-CM | POA: Insufficient documentation

## 2016-12-01 DIAGNOSIS — Z5112 Encounter for antineoplastic immunotherapy: Secondary | ICD-10-CM | POA: Insufficient documentation

## 2016-12-01 DIAGNOSIS — I4891 Unspecified atrial fibrillation: Secondary | ICD-10-CM | POA: Diagnosis not present

## 2016-12-01 DIAGNOSIS — E871 Hypo-osmolality and hyponatremia: Secondary | ICD-10-CM | POA: Diagnosis not present

## 2016-12-01 DIAGNOSIS — C3412 Malignant neoplasm of upper lobe, left bronchus or lung: Secondary | ICD-10-CM

## 2016-12-01 DIAGNOSIS — J189 Pneumonia, unspecified organism: Secondary | ICD-10-CM | POA: Insufficient documentation

## 2016-12-01 DIAGNOSIS — Z7901 Long term (current) use of anticoagulants: Secondary | ICD-10-CM | POA: Diagnosis not present

## 2016-12-01 DIAGNOSIS — N189 Chronic kidney disease, unspecified: Secondary | ICD-10-CM | POA: Insufficient documentation

## 2016-12-01 DIAGNOSIS — Z803 Family history of malignant neoplasm of breast: Secondary | ICD-10-CM

## 2016-12-01 DIAGNOSIS — Z8051 Family history of malignant neoplasm of kidney: Secondary | ICD-10-CM | POA: Insufficient documentation

## 2016-12-01 DIAGNOSIS — I129 Hypertensive chronic kidney disease with stage 1 through stage 4 chronic kidney disease, or unspecified chronic kidney disease: Secondary | ICD-10-CM | POA: Diagnosis not present

## 2016-12-01 DIAGNOSIS — E039 Hypothyroidism, unspecified: Secondary | ICD-10-CM | POA: Diagnosis not present

## 2016-12-01 DIAGNOSIS — E559 Vitamin D deficiency, unspecified: Secondary | ICD-10-CM | POA: Insufficient documentation

## 2016-12-01 DIAGNOSIS — Z79899 Other long term (current) drug therapy: Secondary | ICD-10-CM | POA: Insufficient documentation

## 2016-12-01 DIAGNOSIS — D510 Vitamin B12 deficiency anemia due to intrinsic factor deficiency: Secondary | ICD-10-CM | POA: Diagnosis not present

## 2016-12-01 DIAGNOSIS — Z87891 Personal history of nicotine dependence: Secondary | ICD-10-CM | POA: Diagnosis not present

## 2016-12-01 DIAGNOSIS — J440 Chronic obstructive pulmonary disease with acute lower respiratory infection: Secondary | ICD-10-CM | POA: Diagnosis not present

## 2016-12-01 LAB — BASIC METABOLIC PANEL
Anion gap: 6 (ref 5–15)
BUN: 18 mg/dL (ref 6–20)
CHLORIDE: 102 mmol/L (ref 101–111)
CO2: 27 mmol/L (ref 22–32)
Calcium: 9.2 mg/dL (ref 8.9–10.3)
Creatinine, Ser: 1.05 mg/dL — ABNORMAL HIGH (ref 0.44–1.00)
GFR calc non Af Amer: 47 mL/min — ABNORMAL LOW (ref >60)
GFR, EST AFRICAN AMERICAN: 55 mL/min — AB (ref >60)
Glucose, Bld: 91 mg/dL (ref 65–99)
POTASSIUM: 4.1 mmol/L (ref 3.5–5.1)
Sodium: 135 mmol/L (ref 135–145)

## 2016-12-01 LAB — CBC WITH DIFFERENTIAL/PLATELET
Basophils Absolute: 0.1 10*3/uL (ref 0–0.1)
Basophils Relative: 1 %
Eosinophils Absolute: 1 10*3/uL — ABNORMAL HIGH (ref 0–0.7)
Eosinophils Relative: 15 %
HEMATOCRIT: 36.1 % (ref 35.0–47.0)
HEMOGLOBIN: 12 g/dL (ref 12.0–16.0)
LYMPHS ABS: 0.5 10*3/uL — AB (ref 1.0–3.6)
Lymphocytes Relative: 8 %
MCH: 26.9 pg (ref 26.0–34.0)
MCHC: 33.3 g/dL (ref 32.0–36.0)
MCV: 80.9 fL (ref 80.0–100.0)
MONOS PCT: 12 %
Monocytes Absolute: 0.8 10*3/uL (ref 0.2–0.9)
NEUTROS ABS: 4.4 10*3/uL (ref 1.4–6.5)
NEUTROS PCT: 64 %
Platelets: 240 10*3/uL (ref 150–440)
RBC: 4.46 MIL/uL (ref 3.80–5.20)
RDW: 17.8 % — ABNORMAL HIGH (ref 11.5–14.5)
WBC: 6.7 10*3/uL (ref 3.6–11.0)

## 2016-12-01 LAB — TSH: TSH: 3.263 u[IU]/mL (ref 0.350–4.500)

## 2016-12-01 MED ORDER — SODIUM CHLORIDE 0.9 % IV SOLN
Freq: Once | INTRAVENOUS | Status: AC
  Administered 2016-12-01: 15:00:00 via INTRAVENOUS
  Filled 2016-12-01: qty 1000

## 2016-12-01 MED ORDER — PEMBROLIZUMAB CHEMO INJECTION 100 MG/4ML
200.0000 mg | Freq: Once | INTRAVENOUS | Status: AC
  Administered 2016-12-01: 200 mg via INTRAVENOUS
  Filled 2016-12-01: qty 8

## 2016-12-01 MED ORDER — HYDROXYZINE HCL 25 MG PO TABS
25.0000 mg | ORAL_TABLET | Freq: Three times a day (TID) | ORAL | 1 refills | Status: DC | PRN
Start: 2016-12-01 — End: 2017-04-19

## 2016-12-01 NOTE — Progress Notes (Signed)
[facility] NOTE  Patient Care Team: Einar Pheasant, MD as PCP - General (Internal Medicine) Wellington Hampshire, MD as Consulting Physician (Cardiology)  CHIEF COMPLAINTS/PURPOSE OF CONSULTATION: Lung cancer  #  Oncology History   # SEP 2017- SQUAMOUS CELL CA [s/p Bronch Dr.S Khan];PET - LEFT UPPER LOBE MASS- left upper lung collpase; mediastinal LN; Contralateral Right nodules [x3];STAGE IV [contralateral lung nodules]   # PDL-1- 90% [Keytruda]  # Hx of smoking/ COPD/ CKD [creat ~ 1.5]     Primary cancer of left upper lobe of lung ([place])   07/06/2016 Initial Diagnosis    Primary cancer of left upper lobe of lung ([facility])       HISTORY OF PRESENTING ILLNESS:  Alice Delgado 83 y.o.  female with newly diagnosed squamous cell lung cancer stage IV; currently on palliative Beryle Flock is here for a follow up/ review results of CT scan.  Shortness of breath is improved. Night sweats improved. Continues to be on 2-3 days of oxygen. Cough improved. No fevers. Her appetite is improving. Gaining weight. For the first time in many months "she feels her old self".  No bone pain. No headaches. No hoarseness of voice. Denies any unusual tingling and numbness. No worsening headaches.  ROS: A complete 10 point review of system is done which is negative except mentioned above in history of present illness  MEDICAL HISTORY:  Past Medical History:  Diagnosis Date  . Asthma   . Diverticulosis   . GERD (gastroesophageal reflux disease)   . Hiatal hernia   . Hypercholesterolemia   . Hyperglycemia   . Hypertension   . Hypothyroidism   . Osteoarthritis    lumbar disc dz, cervical disc dz, hands  . Osteoporosis    intolerance to fosamax, evista and miacalcin  . Peptic ulcer disease   . Pernicious anemia   . Vitamin D deficiency     SURGICAL HISTORY: Past Surgical History:  Procedure Laterality Date  . ABDOMINAL HYSTERECTOMY     with benign ovarian tumor removed.    Marland Kitchen  FLEXIBLE BRONCHOSCOPY N/A 06/16/2016   Procedure: FLEXIBLE BRONCHOSCOPY;  Surgeon: Allyne Gee, MD;  Location: [facility] ORS;  Service: Pulmonary;  Laterality: N/A;  . 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 No alcohol. Social History   Social History  . Marital status: Widowed    Spouse name: N/A  . Number of children: 2  . Years of education: N/A   Occupational History  . Not on file.   Social History Main Topics  . Smoking status: Former Smoker    Years: 15.00    Types: Cigarettes  . Smokeless tobacco: Never Used  . Alcohol use No  . Drug use: No  . Sexual activity: Not on file   Other Topics Concern  . Not on file   Social History Narrative  . No narrative on file    FAMILY HISTORY: Family History  Problem Relation Age of Onset  . Heart disease Father     myocardial infarction  . Heart attack Father   . Cancer Mother     cancer of lymph nodes and kidney  . Breast cancer Sister   . Colon cancer Neg Hx     ALLERGIES:  is allergic to penicillins; augmentin [amoxicillin-pot clavulanate]; evista [raloxifene]; fosamax [alendronate sodium]; lipitor [atorvastatin]; miralax [polyethylene glycol]; and zetia [ezetimibe].  MEDICATIONS:  Current Outpatient Prescriptions  Medication Sig Dispense  Refill  . albuterol (PROAIR HFA) 108 (90 BASE) MCG/ACT inhaler Inhale 1 puff into the lungs every 6 (six) hours as needed for wheezing or shortness of breath.    Marland Kitchen albuterol (PROVENTIL) (2.5 MG/3ML) 0.083% nebulizer solution Take 3 mLs (2.5 mg total) by nebulization 2 (two) times daily. DX: EMPHYSEMA DX CODE: J43.8 75 mL 12  . apixaban (ELIQUIS) 2.5 MG TABS tablet Take 1 tablet (2.5 mg total) by mouth 2 (two) times daily. 60 tablet 3  . BREO ELLIPTA 100-25 MCG/INH AEPB INHALE 1 PUFF BY MOUTH DAILY 30 each 4  . cyanocobalamin (,VITAMIN B-12,) 1000 MCG/ML injection Inject 1 mL (1,000 mcg total) into the muscle every 30  (thirty) days. 10 mL 1  . HYDROcodone-acetaminophen (VICODIN) 5-325 mg TABS tablet One tablet q day to bid prn 40 tablet 0  . hydrOXYzine (ATARAX/VISTARIL) 25 MG tablet Take 1 tablet (25 mg total) by mouth 3 (three) times daily as needed for itching. 60 tablet 1  . ipratropium-albuterol (DUONEB) 0.5-2.5 (3) MG/3ML SOLN Take 3 mLs by nebulization as needed.    . metoprolol (LOPRESSOR) 50 MG tablet Take 1 tablet (50 mg total) by mouth 2 (two) times daily. 60 tablet 3  . montelukast (SINGULAIR) 10 MG tablet Take 1 tablet (10 mg total) by mouth at bedtime. 90 tablet 1  . nitroGLYCERIN (NITROSTAT) 0.4 MG SL tablet Place 1 tablet (0.4 mg total) under the tongue every 5 (five) minutes as needed for chest pain. 25 tablet 2  . ondansetron (ZOFRAN) 4 MG tablet TK 1 T PO BID PRF NAUSEA  1  . spironolactone (ALDACTONE) 25 MG tablet TAKE 1 TABLET BY MOUTH DAILY 30 tablet 3  . tiotropium (SPIRIVA) 18 MCG inhalation capsule Place 1 capsule (18 mcg total) into inhaler and inhale daily. 30 capsule 5  . triamcinolone ointment (KENALOG) 0.5 % Apply 1 application topically 2 (two) times daily. 30 g 0   No current facility-administered medications for this visit.       Marland Kitchen  PHYSICAL EXAMINATION: ECOG PERFORMANCE STATUS: 1 - Symptomatic but completely ambulatory  Vitals:   12/01/16 1343  BP: 127/67  Pulse: (!) 57  Temp: 97.8 F (36.6 C)   Filed Weights   12/01/16 1343  Weight: 119 lb 8 oz (54.2 kg)    GENERAL: Well-nourished well-developed; Alert, no distress and comfortable.  With a daughters.She is in a wheelchair. She is on 3 L of oxygen. EYES: no pallor or icterus OROPHARYNX: no thrush or ulceration; good dentition  NECK: supple, no masses felt LYMPH:  no palpable lymphadenopathy in the cervical, axillary or inguinal regions LUNGS: Decreased breath sounds on the left side compared to the right.  HEART/CVS: regular rate & rhythm and no murmurs; No lower extremity edema ABDOMEN: abdomen soft,  non-tender and normal bowel sounds Musculoskeletal:no cyanosis of digits and no clubbing  PSYCH: alert & oriented x 3 with fluent speech NEURO: no focal motor/sensory deficits  LABORATORY DATA:  I have reviewed the data as listed Lab Results  Component Value Date   WBC 6.7 12/01/2016   HGB 12.0 12/01/2016   HCT 36.1 12/01/2016   MCV 80.9 12/01/2016   PLT 240 12/01/2016    Recent Labs  09/20/16 1323 10/15/16 1310 11/05/16 1329 12/01/16 1308  NA 126* 130* 133* 135  K 3.7 3.8 4.2 4.1  CL 92* 99* 100* 102  CO2 _0 GLUCOSE 147* 113* 95 91  BUN 23* 19 21* 18  CREATININE 1.39* 1.29* 1.14*  1.05*  CALCIUM 8.8* 8.6* 8.9 9.2  GFRNONAA 34* 37* 43* 47*  GFRAA 39* 43* 49* 55*  PROT 7.2 6.3* 6.8  --   ALBUMIN 3.8 3.1* 3.5  --   AST _0 --   ALT 11* 10* 8*  --   ALKPHOS 83 86 91  --   BILITOT 0.7 0.4 0.5  --     RADIOGRAPHIC STUDIES: I have personally reviewed the radiological images as listed and agreed with the findings in the report. Ct Chest Wo Contrast  Result Date: 11/05/2016 CLINICAL DATA:  Followup of pneumonia. Right-sided lung cancer diagnosed in August. Primary cancer of left upper lobe. EXAM: CT CHEST WITHOUT CONTRAST TECHNIQUE: Multidetector CT imaging of the chest was performed following the standard protocol without IV contrast. COMPARISON:  Plain film 10/25/2016. Most recent chest CT of 08/26/2016. PET of 06/14/2016. FINDINGS: Cardiovascular: Advanced aortic and branch vessel atherosclerosis. Mild cardiomegaly with multivessel Coronary artery atherosclerosis. Pulmonary artery enlargement, 3.3 cm outflow tract. Mediastinum/Nodes: No supraclavicular adenopathy. No mediastinal or definite hilar adenopathy, given limitations of unenhanced CT. Lungs/Pleura: Small left pleural effusion is slightly decreased since the prior CT. Biapical pleural-parenchymal scarring. Spiculated posterior right upper lobe lung nodule measures 2.6 x 1.8 cm on image 41/series 3. Felt  to be similar to 2.4 x 2.4 cm on the prior. Immediately posterior lateral satellite 8 mm nodule is new. Superior segment right lower lobe spiculated nodule measures 2.1 x 1.2 cm on image 83/series 3. Compare 2.1 x 1.4 cm on the prior. Persistent minimal cavitation within. Posterior right upper lobe nodule measures 1.5 x 0.8 cm on image 49/series 3. Compare 1.3 x 1.0 cm on the prior. Decrease in soft tissue fullness about the left suprahilar region and medial left upper lobe. Measures on the order of 4.0 x 2.6 cm on image 59/series 2. Compare 6.1 x 6.0 cm on the prior. Improved aeration of left upper lobe endobronchial tree. The expansion of the previously collapsed medial left upper lobe. Mild volume loss in the inferolateral left upper lobe remains. New left lower lobe patchy consolidation. Upper Abdomen: Normal imaged portions of the liver, spleen, stomach, pancreas, adrenal glands, kidneys. Advanced abdominal aortic atherosclerosis. Suspect gallstones. Musculoskeletal: Osteopenia. Subtle sclerosis involving the fifth anterior right rib is chronic and most likely related to remote trauma. S-shaped thoracolumbar spine curvature. IMPRESSION: 1. Further response to therapy of central left upper lobe lung mass. 2. New patchy left lower lobe consolidation. Favor infection. Sequelae of radiation therapy could look similar. 3. Primarily similar right-sided pulmonary nodules. A new satellite posterior right upper lobe nodule is likely indicative of metastatic disease. 4. Decrease in small left pleural effusion. 5. Probable cholelithiasis. 6. Pulmonary artery enlargement suggests pulmonary arterial hypertension. 7.  Coronary artery atherosclerosis. Aortic atherosclerosis. Electronically Signed   By: Abigail Miyamoto M.D.   On: 11/05/2016 14:53    ASSESSMENT & PLAN:   Primary cancer of left upper lobe of lung ([place]) # Squamous cell carcinoma of the left upper lobe with atelectasis left upper lung; and also contralateral  lung nodules- stage IV lung cancer. S/p RT. Jan 19th CT scan shows- improved left hilar mass; STABLE 3 lung nodules.    # proceed with cycle #5 of keytruda. Labs today reviewed;  acceptable for treatment today. TSh from today pending.   # SOB/PNA- Better today. Still on 3L continuous oxygen therapy.   # Afib on eliquis.   # Hyponatremia- Improving resolved.   # Follow-up in 3 weeks  cycle # of Keytruda/labs; 6 weeks.   # I reviewed the blood work- with the patient in detail; also reviewed the imaging independently [as summarized above]; and with the patient in detail.        Cammie Sickle, MD 12/01/2016 4:01 PM

## 2016-12-01 NOTE — Progress Notes (Signed)
Patient here today for follow up.   

## 2016-12-01 NOTE — Assessment & Plan Note (Signed)
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 S/p RT. Jan 19th CT scan shows- improved left hilar mass; STABLE 3 lung nodules.    # proceed with cycle #5 of keytruda. Labs today reviewed;  acceptable for treatment today. TSh from today pending.   # SOB/PNA- Better today. Still on 3L continuous oxygen therapy.   # Afib on eliquis.   # Hyponatremia- Improving resolved.   # Follow-up in 3 weeks cycle # of Keytruda/labs; 6 weeks.   # I reviewed the blood work- with the patient in detail; also reviewed the imaging independently [as summarized above]; and with the patient in detail.

## 2016-12-14 DIAGNOSIS — J449 Chronic obstructive pulmonary disease, unspecified: Secondary | ICD-10-CM | POA: Diagnosis not present

## 2016-12-16 DIAGNOSIS — J449 Chronic obstructive pulmonary disease, unspecified: Secondary | ICD-10-CM | POA: Diagnosis not present

## 2016-12-22 ENCOUNTER — Inpatient Hospital Stay (HOSPITAL_BASED_OUTPATIENT_CLINIC_OR_DEPARTMENT_OTHER): Payer: Medicare Other | Admitting: Internal Medicine

## 2016-12-22 ENCOUNTER — Inpatient Hospital Stay: Payer: Medicare Other

## 2016-12-22 ENCOUNTER — Ambulatory Visit: Payer: Medicare Other

## 2016-12-22 ENCOUNTER — Other Ambulatory Visit: Payer: Medicare Other

## 2016-12-22 ENCOUNTER — Inpatient Hospital Stay: Payer: Medicare Other | Attending: Internal Medicine

## 2016-12-22 ENCOUNTER — Ambulatory Visit: Payer: Medicare Other | Admitting: Internal Medicine

## 2016-12-22 VITALS — BP 127/77 | HR 69 | Temp 97.5°F | Wt 119.5 lb

## 2016-12-22 DIAGNOSIS — C771 Secondary and unspecified malignant neoplasm of intrathoracic lymph nodes: Secondary | ICD-10-CM | POA: Diagnosis not present

## 2016-12-22 DIAGNOSIS — K219 Gastro-esophageal reflux disease without esophagitis: Secondary | ICD-10-CM | POA: Insufficient documentation

## 2016-12-22 DIAGNOSIS — C3412 Malignant neoplasm of upper lobe, left bronchus or lung: Secondary | ICD-10-CM | POA: Diagnosis not present

## 2016-12-22 DIAGNOSIS — D510 Vitamin B12 deficiency anemia due to intrinsic factor deficiency: Secondary | ICD-10-CM | POA: Diagnosis not present

## 2016-12-22 DIAGNOSIS — M1990 Unspecified osteoarthritis, unspecified site: Secondary | ICD-10-CM

## 2016-12-22 DIAGNOSIS — Z5112 Encounter for antineoplastic immunotherapy: Secondary | ICD-10-CM | POA: Diagnosis not present

## 2016-12-22 DIAGNOSIS — E559 Vitamin D deficiency, unspecified: Secondary | ICD-10-CM | POA: Diagnosis not present

## 2016-12-22 DIAGNOSIS — Z8051 Family history of malignant neoplasm of kidney: Secondary | ICD-10-CM | POA: Insufficient documentation

## 2016-12-22 DIAGNOSIS — Z87891 Personal history of nicotine dependence: Secondary | ICD-10-CM | POA: Diagnosis not present

## 2016-12-22 DIAGNOSIS — Z880 Allergy status to penicillin: Secondary | ICD-10-CM | POA: Insufficient documentation

## 2016-12-22 DIAGNOSIS — I4891 Unspecified atrial fibrillation: Secondary | ICD-10-CM | POA: Diagnosis not present

## 2016-12-22 DIAGNOSIS — I129 Hypertensive chronic kidney disease with stage 1 through stage 4 chronic kidney disease, or unspecified chronic kidney disease: Secondary | ICD-10-CM | POA: Diagnosis not present

## 2016-12-22 DIAGNOSIS — Z79899 Other long term (current) drug therapy: Secondary | ICD-10-CM | POA: Diagnosis not present

## 2016-12-22 DIAGNOSIS — Z7901 Long term (current) use of anticoagulants: Secondary | ICD-10-CM | POA: Insufficient documentation

## 2016-12-22 DIAGNOSIS — Z9981 Dependence on supplemental oxygen: Secondary | ICD-10-CM | POA: Insufficient documentation

## 2016-12-22 DIAGNOSIS — J449 Chronic obstructive pulmonary disease, unspecified: Secondary | ICD-10-CM | POA: Insufficient documentation

## 2016-12-22 DIAGNOSIS — E7800 Pure hypercholesterolemia, unspecified: Secondary | ICD-10-CM | POA: Diagnosis not present

## 2016-12-22 DIAGNOSIS — I482 Chronic atrial fibrillation: Secondary | ICD-10-CM | POA: Diagnosis not present

## 2016-12-22 DIAGNOSIS — E039 Hypothyroidism, unspecified: Secondary | ICD-10-CM

## 2016-12-22 DIAGNOSIS — L299 Pruritus, unspecified: Secondary | ICD-10-CM | POA: Diagnosis not present

## 2016-12-22 DIAGNOSIS — M549 Dorsalgia, unspecified: Secondary | ICD-10-CM | POA: Diagnosis not present

## 2016-12-22 DIAGNOSIS — N189 Chronic kidney disease, unspecified: Secondary | ICD-10-CM

## 2016-12-22 DIAGNOSIS — N183 Chronic kidney disease, stage 3 (moderate): Secondary | ICD-10-CM | POA: Diagnosis not present

## 2016-12-22 DIAGNOSIS — M810 Age-related osteoporosis without current pathological fracture: Secondary | ICD-10-CM | POA: Diagnosis not present

## 2016-12-22 DIAGNOSIS — G8929 Other chronic pain: Secondary | ICD-10-CM | POA: Diagnosis not present

## 2016-12-22 DIAGNOSIS — Z807 Family history of other malignant neoplasms of lymphoid, hematopoietic and related tissues: Secondary | ICD-10-CM

## 2016-12-22 LAB — CBC WITH DIFFERENTIAL/PLATELET
Basophils Absolute: 0.1 10*3/uL (ref 0–0.1)
Basophils Relative: 1 %
EOS ABS: 1.1 10*3/uL — AB (ref 0–0.7)
EOS PCT: 16 %
HCT: 39.4 % (ref 35.0–47.0)
Hemoglobin: 13 g/dL (ref 12.0–16.0)
LYMPHS ABS: 0.5 10*3/uL — AB (ref 1.0–3.6)
Lymphocytes Relative: 8 %
MCH: 26.8 pg (ref 26.0–34.0)
MCHC: 33.1 g/dL (ref 32.0–36.0)
MCV: 80.7 fL (ref 80.0–100.0)
MONO ABS: 0.6 10*3/uL (ref 0.2–0.9)
MONOS PCT: 10 %
Neutro Abs: 4.4 10*3/uL (ref 1.4–6.5)
Neutrophils Relative %: 65 %
PLATELETS: 246 10*3/uL (ref 150–440)
RBC: 4.88 MIL/uL (ref 3.80–5.20)
RDW: 16.2 % — ABNORMAL HIGH (ref 11.5–14.5)
WBC: 6.7 10*3/uL (ref 3.6–11.0)

## 2016-12-22 LAB — COMPREHENSIVE METABOLIC PANEL
ALBUMIN: 3.9 g/dL (ref 3.5–5.0)
ALK PHOS: 103 U/L (ref 38–126)
ALT: 10 U/L — AB (ref 14–54)
AST: 19 U/L (ref 15–41)
Anion gap: 3 — ABNORMAL LOW (ref 5–15)
BUN: 20 mg/dL (ref 6–20)
CALCIUM: 9.1 mg/dL (ref 8.9–10.3)
CHLORIDE: 102 mmol/L (ref 101–111)
CO2: 29 mmol/L (ref 22–32)
CREATININE: 1.06 mg/dL — AB (ref 0.44–1.00)
GFR calc non Af Amer: 47 mL/min — ABNORMAL LOW (ref >60)
GFR, EST AFRICAN AMERICAN: 54 mL/min — AB (ref >60)
GLUCOSE: 103 mg/dL — AB (ref 65–99)
Potassium: 4.3 mmol/L (ref 3.5–5.1)
SODIUM: 134 mmol/L — AB (ref 135–145)
Total Bilirubin: 0.5 mg/dL (ref 0.3–1.2)
Total Protein: 7.2 g/dL (ref 6.5–8.1)

## 2016-12-22 MED ORDER — SODIUM CHLORIDE 0.9 % IV SOLN
200.0000 mg | Freq: Once | INTRAVENOUS | Status: AC
  Administered 2016-12-22: 200 mg via INTRAVENOUS
  Filled 2016-12-22: qty 8

## 2016-12-22 MED ORDER — SODIUM CHLORIDE 0.9 % IV SOLN
Freq: Once | INTRAVENOUS | Status: AC
  Administered 2016-12-22: 14:00:00 via INTRAVENOUS
  Filled 2016-12-22: qty 1000

## 2016-12-22 NOTE — Progress Notes (Signed)
[name] NOTE  Patient Care Team: Einar Pheasant, MD as PCP - General (Internal Medicine) Wellington Hampshire, MD as Consulting Physician (Cardiology)  CHIEF COMPLAINTS/PURPOSE OF CONSULTATION: Lung cancer  #  Oncology History   # SEP 2017- SQUAMOUS CELL CA [s/p Bronch Dr.S Khan];PET - LEFT UPPER LOBE MASS- left upper lung collpase; mediastinal LN; Contralateral Right nodules [x3];STAGE IV [contralateral lung nodules]   # PDL-1- 90% [Keytruda]  # Hx of smoking/ COPD/ CKD [creat ~ 1.5]     Primary cancer of left upper lobe of lung ([place])   07/06/2016 Initial Diagnosis    Primary cancer of left upper lobe of lung ([facility])       HISTORY OF PRESENTING ILLNESS:  Alice Delgado 83 y.o.  female with newly diagnosed squamous cell lung cancer stage IV; currently on palliative Beryle Flock is here for a follow up.   Patient continues to have mild shortness of breath on exertion. She continues to be on oxygen up to 3 L. No worsening cough. No fevers. No night sweats. No diarrhea or headaches.  ROS: A complete 10 point review of system is done which is negative except mentioned above in history of present illness  MEDICAL HISTORY:  Past Medical History:  Diagnosis Date  . Asthma   . Diverticulosis   . GERD (gastroesophageal reflux disease)   . Hiatal hernia   . Hypercholesterolemia   . Hyperglycemia   . Hypertension   . Hypothyroidism   . Osteoarthritis    lumbar disc dz, cervical disc dz, hands  . Osteoporosis    intolerance to fosamax, evista and miacalcin  . Peptic ulcer disease   . Pernicious anemia   . Vitamin D deficiency     SURGICAL HISTORY: Past Surgical History:  Procedure Laterality Date  . ABDOMINAL HYSTERECTOMY     with benign ovarian tumor removed.    Marland Kitchen FLEXIBLE BRONCHOSCOPY N/A 06/16/2016   Procedure: FLEXIBLE BRONCHOSCOPY;  Surgeon: Allyne Gee, MD;  Location: [facility] ORS;  Service: Pulmonary;  Laterality: N/A;  . 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 No alcohol. Social History   Social History  . Marital status: Widowed    Spouse name: N/A  . Number of children: 2  . Years of education: N/A   Occupational History  . Not on file.   Social History Main Topics  . Smoking status: Former Smoker    Years: 15.00    Types: Cigarettes  . Smokeless tobacco: Never Used  . Alcohol use No  . Drug use: No  . Sexual activity: Not on file   Other Topics Concern  . Not on file   Social History Narrative  . No narrative on file    FAMILY HISTORY: Family History  Problem Relation Age of Onset  . Heart disease Father     myocardial infarction  . Heart attack Father   . Cancer Mother     cancer of lymph nodes and kidney  . Breast cancer Sister   . Colon cancer Neg Hx     ALLERGIES:  is allergic to penicillins; augmentin [amoxicillin-pot clavulanate]; evista [raloxifene]; fosamax [alendronate sodium]; lipitor [atorvastatin]; miralax [polyethylene glycol]; and zetia [ezetimibe].  MEDICATIONS:  Current Outpatient Prescriptions  Medication Sig Dispense Refill  . albuterol (PROAIR HFA) 108 (90 BASE) MCG/ACT inhaler Inhale 1 puff into the lungs every 6 (six) hours as needed for wheezing or shortness of breath.    Marland Kitchen  albuterol (PROVENTIL) (2.5 MG/3ML) 0.083% nebulizer solution Take 3 mLs (2.5 mg total) by nebulization 2 (two) times daily. DX: EMPHYSEMA DX CODE: J43.8 75 mL 12  . apixaban (ELIQUIS) 2.5 MG TABS tablet Take 1 tablet (2.5 mg total) by mouth 2 (two) times daily. 60 tablet 3  . BREO ELLIPTA 100-25 MCG/INH AEPB INHALE 1 PUFF BY MOUTH DAILY 30 each 4  . cyanocobalamin (,VITAMIN B-12,) 1000 MCG/ML injection Inject 1 mL (1,000 mcg total) into the muscle every 30 (thirty) days. 10 mL 1  . HYDROcodone-acetaminophen (VICODIN) 5-325 mg TABS tablet One tablet q day to bid prn 40 tablet 0  . hydrOXYzine (ATARAX/VISTARIL) 25 MG tablet Take 1 tablet  (25 mg total) by mouth 3 (three) times daily as needed for itching. 60 tablet 1  . ipratropium-albuterol (DUONEB) 0.5-2.5 (3) MG/3ML SOLN Take 3 mLs by nebulization as needed.    . metoprolol (LOPRESSOR) 50 MG tablet Take 1 tablet (50 mg total) by mouth 2 (two) times daily. 60 tablet 3  . montelukast (SINGULAIR) 10 MG tablet Take 1 tablet (10 mg total) by mouth at bedtime. 90 tablet 1  . nitroGLYCERIN (NITROSTAT) 0.4 MG SL tablet Place 1 tablet (0.4 mg total) under the tongue every 5 (five) minutes as needed for chest pain. 25 tablet 2  . ondansetron (ZOFRAN) 4 MG tablet TK 1 T PO BID PRF NAUSEA  1  . spironolactone (ALDACTONE) 25 MG tablet TAKE 1 TABLET BY MOUTH DAILY 30 tablet 3  . tiotropium (SPIRIVA) 18 MCG inhalation capsule Place 1 capsule (18 mcg total) into inhaler and inhale daily. 30 capsule 5  . triamcinolone ointment (KENALOG) 0.5 % Apply 1 application topically 2 (two) times daily. 30 g 0   No current facility-administered medications for this visit.       Marland Kitchen  PHYSICAL EXAMINATION: ECOG PERFORMANCE STATUS: 1 - Symptomatic but completely ambulatory  Vitals:   12/22/16 1354  BP: 127/77  Pulse: 69  Temp: 97.5 F (36.4 C)   Filed Weights   12/22/16 1354  Weight: 119 lb 8 oz (54.2 kg)    GENERAL: Well-nourished well-developed; Alert, no distress and comfortable.  With a daughters.She is in a wheelchair. She is on 3 L of oxygen. EYES: no pallor or icterus OROPHARYNX: no thrush or ulceration; good dentition  NECK: supple, no masses felt LYMPH:  no palpable lymphadenopathy in the cervical, axillary or inguinal regions LUNGS: Decreased breath sounds on the left side compared to the right.  HEART/CVS: regular rate & rhythm and no murmurs; No lower extremity edema ABDOMEN: abdomen soft, non-tender and normal bowel sounds Musculoskeletal:no cyanosis of digits and no clubbing  PSYCH: alert & oriented x 3 with fluent speech NEURO: no focal motor/sensory deficits  LABORATORY  DATA:  I have reviewed the data as listed Lab Results  Component Value Date   WBC 6.7 12/22/2016   HGB 13.0 12/22/2016   HCT 39.4 12/22/2016   MCV 80.7 12/22/2016   PLT 246 12/22/2016    Recent Labs  10/15/16 1310 11/05/16 1329 12/01/16 1308 12/22/16 1315  NA 130* 133* 135 134*  K 3.8 4.2 4.1 4.3  CL 99* 100* 102 102  CO2 _0 GLUCOSE 113* 95 91 103*  BUN 19 21* 18 20  CREATININE 1.29* 1.14* 1.05* 1.06*  CALCIUM 8.6* 8.9 9.2 9.1  GFRNONAA 37* 43* 47* 47*  GFRAA 43* 49* 55* 54*  PROT 6.3* 6.8  --  7.2  ALBUMIN 3.1* 3.5  --  3.9  AST 17 17  --  19  ALT 10* 8*  --  10*  ALKPHOS 86 91  --  103  BILITOT 0.4 0.5  --  0.5    RADIOGRAPHIC STUDIES: I have personally reviewed the radiological images as listed and agreed with the findings in the report. No results found.  ASSESSMENT & PLAN:   Primary cancer of left upper lobe of lung ([place]) # Squamous cell carcinoma of the left upper lobe with atelectasis left upper lung; and also contralateral lung nodules- stage IV lung cancer. S/p RT. Jan 19th CT scan shows- improved left hilar mass; STABLE 3 lung nodules.  No evidence of any clinical progression at this time.  # proceed with cycle #6 of keytruda. Labs today reviewed;  acceptable for treatment today. TSH- feb 2018-N.   # SOB/COPD/ Lung ca- Still on 3L continuous oxygen therapy. pulx on today 96%; will check on RA at next visit; and wean off.   # Afib on eliquis/stable.  # Follow-up in 3 weeks cycle # of Keytruda/labs; will order CT scan at next visit. 6 weeks.        Cammie Sickle, MD 12/22/2016 2:18 PM

## 2016-12-22 NOTE — Progress Notes (Signed)
Patient here today for follow up.   

## 2016-12-22 NOTE — Assessment & Plan Note (Addendum)
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 S/p RT. Jan 19th CT scan shows- improved left hilar mass; STABLE 3 lung nodules.  No evidence of any clinical progression at this time.  # proceed with cycle #6 of keytruda. Labs today reviewed;  acceptable for treatment today. TSH- feb 2018-N.   # SOB/COPD/ Lung ca- Still on 3L continuous oxygen therapy. pulx on today 96%; will check on RA at next visit; and wean off.   # Afib on eliquis/stable.  # Follow-up in 3 weeks cycle # of Keytruda/labs; will order CT scan at next visit. 6 weeks.

## 2017-01-04 ENCOUNTER — Encounter: Payer: Self-pay | Admitting: Internal Medicine

## 2017-01-04 ENCOUNTER — Ambulatory Visit (INDEPENDENT_AMBULATORY_CARE_PROVIDER_SITE_OTHER): Payer: Medicare Other | Admitting: Internal Medicine

## 2017-01-04 DIAGNOSIS — C3412 Malignant neoplasm of upper lobe, left bronchus or lung: Secondary | ICD-10-CM

## 2017-01-04 DIAGNOSIS — E119 Type 2 diabetes mellitus without complications: Secondary | ICD-10-CM

## 2017-01-04 DIAGNOSIS — N183 Chronic kidney disease, stage 3 unspecified: Secondary | ICD-10-CM

## 2017-01-04 DIAGNOSIS — R634 Abnormal weight loss: Secondary | ICD-10-CM | POA: Diagnosis not present

## 2017-01-04 DIAGNOSIS — R0602 Shortness of breath: Secondary | ICD-10-CM

## 2017-01-04 DIAGNOSIS — E7800 Pure hypercholesterolemia, unspecified: Secondary | ICD-10-CM | POA: Diagnosis not present

## 2017-01-04 DIAGNOSIS — I10 Essential (primary) hypertension: Secondary | ICD-10-CM

## 2017-01-04 NOTE — Progress Notes (Signed)
Patient ID: Alice Delgado, female   DOB: Sep 03, 1929, 83 y.o.   MRN: [id number]   Subjective:    Patient ID: Alice Delgado, female    DOB: 1929-07-13, 83 y.o.   MRN: [id number]  HPI  Patient here for a scheduled follow up.  She is seeing Dr Rogue Bussing.  Just evalauted 12/22/16.  Continues with [place].  Doing better.  Breathing better.  Still some cough, but overall feels better.  No chest pain.  Wearing oxygen.  Request referral to pulmonary with [facility].  No acid reflux.  No abdominal pain or cramping.  Bowels stable.  On metoprolol.  Heart rate better.  Eating.  Energy is better.     Past Medical History:  Diagnosis Date  . Asthma   . Diverticulosis   . GERD (gastroesophageal reflux disease)   . Hiatal hernia   . Hypercholesterolemia   . Hyperglycemia   . Hypertension   . Hypothyroidism   . Osteoarthritis    lumbar disc dz, cervical disc dz, hands  . Osteoporosis    intolerance to fosamax, evista and miacalcin  . Peptic ulcer disease   . Pernicious anemia   . Vitamin D deficiency    Past Surgical History:  Procedure Laterality Date  . ABDOMINAL HYSTERECTOMY     with benign ovarian tumor removed.    Marland Kitchen FLEXIBLE BRONCHOSCOPY N/A 06/16/2016   Procedure: FLEXIBLE BRONCHOSCOPY;  Surgeon: Allyne Gee, MD;  Location: [facility] ORS;  Service: Pulmonary;  Laterality: N/A;  . LUMBAR FUSION     Family History  Problem Relation Age of Onset  . Heart disease Father     myocardial infarction  . Heart attack Father   . Cancer Mother     cancer of lymph nodes and kidney  . Breast cancer Sister   . Colon cancer Neg Hx    Social History   Social History  . Marital status: Widowed    Spouse name: N/A  . Number of children: 2  . Years of education: N/A   Social History Main Topics  . Smoking status: Former Smoker    Years: 15.00    Types: Cigarettes  . Smokeless tobacco: Never Used  . Alcohol use No  . Drug use: No  . Sexual activity: Not Asked   Other Topics Concern  .  None   Social History Narrative  . None    Outpatient Encounter Prescriptions as of 01/04/2017  Medication Sig  . albuterol (PROAIR HFA) 108 (90 BASE) MCG/ACT inhaler Inhale 1 puff into the lungs every 6 (six) hours as needed for wheezing or shortness of breath.  Marland Kitchen albuterol (PROVENTIL) (2.5 MG/3ML) 0.083% nebulizer solution Take 3 mLs (2.5 mg total) by nebulization 2 (two) times daily. DX: EMPHYSEMA DX CODE: J43.8  . apixaban (ELIQUIS) 2.5 MG TABS tablet Take 1 tablet (2.5 mg total) by mouth 2 (two) times daily.  Marland Kitchen BREO ELLIPTA 100-25 MCG/INH AEPB INHALE 1 PUFF BY MOUTH DAILY  . cyanocobalamin (,VITAMIN B-12,) 1000 MCG/ML injection Inject 1 mL (1,000 mcg total) into the muscle every 30 (thirty) days.  Marland Kitchen HYDROcodone-acetaminophen (VICODIN) 5-325 mg TABS tablet One tablet q day to bid prn  . hydrOXYzine (ATARAX/VISTARIL) 25 MG tablet Take 1 tablet (25 mg total) by mouth 3 (three) times daily as needed for itching.  Marland Kitchen ipratropium-albuterol (DUONEB) 0.5-2.5 (3) MG/3ML SOLN Take 3 mLs by nebulization as needed.  . metoprolol (LOPRESSOR) 50 MG tablet Take 1 tablet (50 mg total) by mouth 2 (two) times daily.  Marland Kitchen  montelukast (SINGULAIR) 10 MG tablet Take 1 tablet (10 mg total) by mouth at bedtime.  . nitroGLYCERIN (NITROSTAT) 0.4 MG SL tablet Place 1 tablet (0.4 mg total) under the tongue every 5 (five) minutes as needed for chest pain.  Marland Kitchen ondansetron (ZOFRAN) 4 MG tablet TK 1 T PO BID PRF NAUSEA  . spironolactone (ALDACTONE) 25 MG tablet TAKE 1 TABLET BY MOUTH DAILY  . tiotropium (SPIRIVA) 18 MCG inhalation capsule Place 1 capsule (18 mcg total) into inhaler and inhale daily.  Marland Kitchen triamcinolone ointment (KENALOG) 0.5 % Apply 1 application topically 2 (two) times daily.   No facility-administered encounter medications on file as of 01/04/2017.     Review of Systems  Constitutional: Positive for fatigue. Negative for appetite change and unexpected weight change.  HENT: Negative for congestion and  sinus pressure.   Respiratory: Negative for chest tightness and shortness of breath.        Cough is better.    Cardiovascular: Negative for chest pain, palpitations and leg swelling.  Gastrointestinal: Negative for abdominal pain, diarrhea, nausea and vomiting.  Genitourinary: Negative for difficulty urinating and dysuria.  Musculoskeletal: Negative for back pain and joint swelling.  Skin: Negative for color change and rash.  Neurological: Negative for dizziness, light-headedness and headaches.  Psychiatric/Behavioral: Negative for agitation and dysphoric mood.       Objective:    Physical Exam  Constitutional: She appears well-developed and well-nourished. No distress.  HENT:  Nose: Nose normal.  Mouth/Throat: Oropharynx is clear and moist.  Neck: Neck supple. No thyromegaly present.  Cardiovascular: Normal rate and regular rhythm.   Pulmonary/Chest: Breath sounds normal. No respiratory distress. She has no wheezes.  Abdominal: Soft. Bowel sounds are normal. There is no tenderness.  Musculoskeletal: She exhibits no edema or tenderness.  Lymphadenopathy:    She has no cervical adenopathy.  Skin: No rash noted. No erythema.  Psychiatric: She has a normal mood and affect. Her behavior is normal.    BP 124/68 (BP Location: Left Arm, Patient Position: Sitting, Cuff Size: Normal)   Pulse 68   Temp 98.7 F (37.1 C) (Oral)   Resp 18   Ht _0  (1.549 m)   Wt 124 lb (56.2 kg)   SpO2 98%   BMI 23.43 kg/m  Wt Readings from Last 3 Encounters:  01/04/17 124 lb (56.2 kg)  12/22/16 119 lb 8 oz (54.2 kg)  12/01/16 119 lb 8 oz (54.2 kg)     Lab Results  Component Value Date   WBC 6.7 12/22/2016   HGB 13.0 12/22/2016   HCT 39.4 12/22/2016   PLT 246 12/22/2016   GLUCOSE 103 (H) 12/22/2016   CHOL 202 (H) 11/19/2013   TRIG 199.0 (H) 11/19/2013   HDL 44.40 11/19/2013   LDLDIRECT 131.1 11/19/2013   LDLCALC 122 (H) 05/29/2013   ALT 10 (L) 12/22/2016   AST 19 12/22/2016   NA  134 (L) 12/22/2016   K 4.3 12/22/2016   CL 102 12/22/2016   CREATININE 1.06 (H) 12/22/2016   BUN 20 12/22/2016   CO2 29 12/22/2016   TSH 3.263 12/01/2016   HGBA1C 6.4 11/13/2013    Ct Chest Wo Contrast  Result Date: 11/05/2016 CLINICAL DATA:  Followup of pneumonia. Right-sided lung cancer diagnosed in August. Primary cancer of left upper lobe. EXAM: CT CHEST WITHOUT CONTRAST TECHNIQUE: Multidetector CT imaging of the chest was performed following the standard protocol without IV contrast. COMPARISON:  Plain film 10/25/2016. Most recent chest CT of 08/26/2016. PET of  06/14/2016. FINDINGS: Cardiovascular: Advanced aortic and branch vessel atherosclerosis. Mild cardiomegaly with multivessel Coronary artery atherosclerosis. Pulmonary artery enlargement, 3.3 cm outflow tract. Mediastinum/Nodes: No supraclavicular adenopathy. No mediastinal or definite hilar adenopathy, given limitations of unenhanced CT. Lungs/Pleura: Small left pleural effusion is slightly decreased since the prior CT. Biapical pleural-parenchymal scarring. Spiculated posterior right upper lobe lung nodule measures 2.6 x 1.8 cm on image 41/series 3. Felt to be similar to 2.4 x 2.4 cm on the prior. Immediately posterior lateral satellite 8 mm nodule is new. Superior segment right lower lobe spiculated nodule measures 2.1 x 1.2 cm on image 83/series 3. Compare 2.1 x 1.4 cm on the prior. Persistent minimal cavitation within. Posterior right upper lobe nodule measures 1.5 x 0.8 cm on image 49/series 3. Compare 1.3 x 1.0 cm on the prior. Decrease in soft tissue fullness about the left suprahilar region and medial left upper lobe. Measures on the order of 4.0 x 2.6 cm on image 59/series 2. Compare 6.1 x 6.0 cm on the prior. Improved aeration of left upper lobe endobronchial tree. The expansion of the previously collapsed medial left upper lobe. Mild volume loss in the inferolateral left upper lobe remains. New left lower lobe patchy consolidation.  Upper Abdomen: Normal imaged portions of the liver, spleen, stomach, pancreas, adrenal glands, kidneys. Advanced abdominal aortic atherosclerosis. Suspect gallstones. Musculoskeletal: Osteopenia. Subtle sclerosis involving the fifth anterior right rib is chronic and most likely related to remote trauma. S-shaped thoracolumbar spine curvature. IMPRESSION: 1. Further response to therapy of central left upper lobe lung mass. 2. New patchy left lower lobe consolidation. Favor infection. Sequelae of radiation therapy could look similar. 3. Primarily similar right-sided pulmonary nodules. A new satellite posterior right upper lobe nodule is likely indicative of metastatic disease. 4. Decrease in small left pleural effusion. 5. Probable cholelithiasis. 6. Pulmonary artery enlargement suggests pulmonary arterial hypertension. 7.  Coronary artery atherosclerosis. Aortic atherosclerosis. Electronically Signed   By: Abigail Miyamoto M.D.   On: 11/05/2016 14:53       Assessment & Plan:   Problem List Items Addressed This Visit    CKD (chronic kidney disease), stage III    Labs being followed through oncology.  Follow metabolic panel.        Diabetes mellitus ([place])    Follow met b and a1c,        Essential hypertension    Blood pressure under good control.  Continue same medication regimen.  Follow pressures.  Follow metabolic panel.        Hypercholesterolemia    Follow lipid panel.        Primary cancer of left upper lobe of lung ([place])    Followed by Dr Rogue Bussing.  Being treated.  Doing well with treatments.  Feels better.  Breathing better.  Follow.        Relevant Orders   Ambulatory referral to Pulmonology   SOB (shortness of breath)    Breathing better.  Being followed by Dr Rogue Bussing for her lung cancer.  On keytruda.  [facility] referral [facility].        Relevant Orders   Ambulatory referral to Pulmonology   Weight loss    Weigh tup some since last check.  Follow.             Einar Pheasant, MD

## 2017-01-04 NOTE — Progress Notes (Signed)
Pre-visit discussion using our clinic review tool. No additional management support is needed unless otherwise documented below in the visit note.  

## 2017-01-06 ENCOUNTER — Ambulatory Visit: Payer: Medicare Other | Admitting: Internal Medicine

## 2017-01-09 ENCOUNTER — Encounter: Payer: Self-pay | Admitting: Internal Medicine

## 2017-01-09 NOTE — Assessment & Plan Note (Signed)
Breathing better.  Being followed by Dr Rogue Bussing for her lung cancer.  On keytruda.  Request referral to [facility].

## 2017-01-09 NOTE — Assessment & Plan Note (Signed)
Follow lipid panel.   

## 2017-01-09 NOTE — Assessment & Plan Note (Signed)
Blood pressure under good control.  Continue same medication regimen.  Follow pressures.  Follow metabolic panel.   

## 2017-01-09 NOTE — Assessment & Plan Note (Signed)
Followed by Dr Rogue Bussing.  Being treated.  Doing well with treatments.  Feels better.  Breathing better.  Follow.

## 2017-01-09 NOTE — Assessment & Plan Note (Signed)
Weigh tup some since last check.  Follow.

## 2017-01-09 NOTE — Assessment & Plan Note (Signed)
Labs being followed through oncology.  Follow metabolic panel.

## 2017-01-09 NOTE — Assessment & Plan Note (Signed)
Follow met b and a1c,

## 2017-01-10 ENCOUNTER — Ambulatory Visit: Payer: Medicare Other | Admitting: Radiation Oncology

## 2017-01-11 ENCOUNTER — Telehealth: Payer: Self-pay

## 2017-01-11 DIAGNOSIS — J449 Chronic obstructive pulmonary disease, unspecified: Secondary | ICD-10-CM | POA: Diagnosis not present

## 2017-01-11 NOTE — Telephone Encounter (Signed)
OK with me - need 30 min visit for my first visit with her  Alice Delgado

## 2017-01-11 NOTE — Telephone Encounter (Signed)
Please advise if you mind if pt switches to DS. Thanks.

## 2017-01-11 NOTE — Telephone Encounter (Signed)
Patient would like to switch to Dr. Alva Garnet.  Per policy both Ram and Alva Garnet have to agree this is ok prior to scheduling.  Please advise .

## 2017-01-12 ENCOUNTER — Inpatient Hospital Stay (HOSPITAL_BASED_OUTPATIENT_CLINIC_OR_DEPARTMENT_OTHER): Payer: Medicare Other | Admitting: Internal Medicine

## 2017-01-12 ENCOUNTER — Inpatient Hospital Stay: Payer: Medicare Other

## 2017-01-12 VITALS — BP 122/68 | HR 92 | Temp 97.8°F | Resp 18 | Ht 61.0 in | Wt 124.5 lb

## 2017-01-12 DIAGNOSIS — Z9981 Dependence on supplemental oxygen: Secondary | ICD-10-CM

## 2017-01-12 DIAGNOSIS — M810 Age-related osteoporosis without current pathological fracture: Secondary | ICD-10-CM

## 2017-01-12 DIAGNOSIS — I129 Hypertensive chronic kidney disease with stage 1 through stage 4 chronic kidney disease, or unspecified chronic kidney disease: Secondary | ICD-10-CM

## 2017-01-12 DIAGNOSIS — M549 Dorsalgia, unspecified: Secondary | ICD-10-CM | POA: Diagnosis not present

## 2017-01-12 DIAGNOSIS — N183 Chronic kidney disease, stage 3 (moderate): Secondary | ICD-10-CM | POA: Diagnosis not present

## 2017-01-12 DIAGNOSIS — C771 Secondary and unspecified malignant neoplasm of intrathoracic lymph nodes: Secondary | ICD-10-CM

## 2017-01-12 DIAGNOSIS — Z8051 Family history of malignant neoplasm of kidney: Secondary | ICD-10-CM | POA: Diagnosis not present

## 2017-01-12 DIAGNOSIS — Z87891 Personal history of nicotine dependence: Secondary | ICD-10-CM | POA: Diagnosis not present

## 2017-01-12 DIAGNOSIS — D510 Vitamin B12 deficiency anemia due to intrinsic factor deficiency: Secondary | ICD-10-CM

## 2017-01-12 DIAGNOSIS — K219 Gastro-esophageal reflux disease without esophagitis: Secondary | ICD-10-CM

## 2017-01-12 DIAGNOSIS — G8929 Other chronic pain: Secondary | ICD-10-CM

## 2017-01-12 DIAGNOSIS — L299 Pruritus, unspecified: Secondary | ICD-10-CM

## 2017-01-12 DIAGNOSIS — Z79899 Other long term (current) drug therapy: Secondary | ICD-10-CM

## 2017-01-12 DIAGNOSIS — Z880 Allergy status to penicillin: Secondary | ICD-10-CM | POA: Diagnosis not present

## 2017-01-12 DIAGNOSIS — E559 Vitamin D deficiency, unspecified: Secondary | ICD-10-CM | POA: Diagnosis not present

## 2017-01-12 DIAGNOSIS — C3412 Malignant neoplasm of upper lobe, left bronchus or lung: Secondary | ICD-10-CM

## 2017-01-12 DIAGNOSIS — E039 Hypothyroidism, unspecified: Secondary | ICD-10-CM

## 2017-01-12 DIAGNOSIS — J449 Chronic obstructive pulmonary disease, unspecified: Secondary | ICD-10-CM

## 2017-01-12 DIAGNOSIS — E7800 Pure hypercholesterolemia, unspecified: Secondary | ICD-10-CM | POA: Diagnosis not present

## 2017-01-12 DIAGNOSIS — M1990 Unspecified osteoarthritis, unspecified site: Secondary | ICD-10-CM | POA: Diagnosis not present

## 2017-01-12 DIAGNOSIS — I482 Chronic atrial fibrillation: Secondary | ICD-10-CM

## 2017-01-12 DIAGNOSIS — Z807 Family history of other malignant neoplasms of lymphoid, hematopoietic and related tissues: Secondary | ICD-10-CM

## 2017-01-12 DIAGNOSIS — I4891 Unspecified atrial fibrillation: Secondary | ICD-10-CM | POA: Diagnosis not present

## 2017-01-12 DIAGNOSIS — Z5112 Encounter for antineoplastic immunotherapy: Secondary | ICD-10-CM | POA: Diagnosis not present

## 2017-01-12 DIAGNOSIS — Z7901 Long term (current) use of anticoagulants: Secondary | ICD-10-CM | POA: Diagnosis not present

## 2017-01-12 LAB — COMPREHENSIVE METABOLIC PANEL
ALBUMIN: 4 g/dL (ref 3.5–5.0)
ALK PHOS: 86 U/L (ref 38–126)
ALT: 10 U/L — ABNORMAL LOW (ref 14–54)
ANION GAP: 6 (ref 5–15)
AST: 22 U/L (ref 15–41)
BILIRUBIN TOTAL: 0.6 mg/dL (ref 0.3–1.2)
BUN: 22 mg/dL — AB (ref 6–20)
CALCIUM: 9.5 mg/dL (ref 8.9–10.3)
CO2: 26 mmol/L (ref 22–32)
Chloride: 101 mmol/L (ref 101–111)
Creatinine, Ser: 1.28 mg/dL — ABNORMAL HIGH (ref 0.44–1.00)
GFR calc Af Amer: 43 mL/min — ABNORMAL LOW (ref >60)
GFR calc non Af Amer: 37 mL/min — ABNORMAL LOW (ref >60)
GLUCOSE: 87 mg/dL (ref 65–99)
POTASSIUM: 4.6 mmol/L (ref 3.5–5.1)
Sodium: 133 mmol/L — ABNORMAL LOW (ref 135–145)
TOTAL PROTEIN: 7.5 g/dL (ref 6.5–8.1)

## 2017-01-12 LAB — CBC WITH DIFFERENTIAL/PLATELET
BASOS PCT: 1 %
Basophils Absolute: 0.1 10*3/uL (ref 0–0.1)
Eosinophils Absolute: 1 10*3/uL — ABNORMAL HIGH (ref 0–0.7)
Eosinophils Relative: 13 %
HEMATOCRIT: 37.7 % (ref 35.0–47.0)
HEMOGLOBIN: 12.6 g/dL (ref 12.0–16.0)
Lymphocytes Relative: 9 %
Lymphs Abs: 0.7 10*3/uL — ABNORMAL LOW (ref 1.0–3.6)
MCH: 27 pg (ref 26.0–34.0)
MCHC: 33.5 g/dL (ref 32.0–36.0)
MCV: 80.8 fL (ref 80.0–100.0)
MONO ABS: 0.9 10*3/uL (ref 0.2–0.9)
MONOS PCT: 11 %
Neutro Abs: 5.2 10*3/uL (ref 1.4–6.5)
Neutrophils Relative %: 66 %
Platelets: 248 10*3/uL (ref 150–440)
RBC: 4.66 MIL/uL (ref 3.80–5.20)
RDW: 15.4 % — AB (ref 11.5–14.5)
WBC: 7.8 10*3/uL (ref 3.6–11.0)

## 2017-01-12 MED ORDER — SODIUM CHLORIDE 0.9 % IV SOLN
200.0000 mg | Freq: Once | INTRAVENOUS | Status: AC
  Administered 2017-01-12: 200 mg via INTRAVENOUS
  Filled 2017-01-12: qty 8

## 2017-01-12 MED ORDER — HYDROCODONE-ACETAMINOPHEN 5-325MG PREPACK (~~LOC~~: ORAL_TABLET | ORAL | 0 refills | Status: DC

## 2017-01-12 MED ORDER — SODIUM CHLORIDE 0.9 % IV SOLN
Freq: Once | INTRAVENOUS | Status: AC
  Administered 2017-01-12: 15:00:00 via INTRAVENOUS
  Filled 2017-01-12: qty 1000

## 2017-01-12 NOTE — Progress Notes (Signed)
[facility] NOTE  Patient Care Team: Einar Pheasant, MD as PCP - General (Internal Medicine) Wellington Hampshire, MD as Consulting Physician (Cardiology)  CHIEF COMPLAINTS/PURPOSE OF CONSULTATION: Lung cancer  #  Oncology History   # SEP 2017- SQUAMOUS CELL CA [s/p Bronch Dr.S Khan];PET - LEFT UPPER LOBE MASS- left upper lung collpase; mediastinal LN; Contralateral Right nodules [x3];STAGE IV [contralateral lung nodules]   # PDL-1- 90% [Keytruda]  # Hx of smoking/ COPD/ CKD [creat ~ 1.5]     Primary cancer of left upper lobe of lung ([place])   07/06/2016 Initial Diagnosis    Primary cancer of left upper lobe of lung ([facility])       HISTORY OF PRESENTING ILLNESS:  Alice Delgado 83 y.o.  female with newly diagnosed squamous cell lung cancer stage IV; currently on palliative Beryle Flock is here for a follow up.  Patient continues to have mild shortness of breath with exertion. She continues to be on 2-4 L of oxygen. Unable to wean herself off oxygen. She has intermittent episodes of cough. No hemoptysis. Requesting to see pulmonary.  No fevers. No night sweats. No diarrhea or headaches. Patient takes hydrocodone at nighttime for chronic back pain. Intermittent Skin itching without any skin rash.  ROS: A complete 10 point review of system is done which is negative except mentioned above in history of present illness  MEDICAL HISTORY:  Past Medical History:  Diagnosis Date  . Asthma   . Diverticulosis   . GERD (gastroesophageal reflux disease)   . Hiatal hernia   . Hypercholesterolemia   . Hyperglycemia   . Hypertension   . Hypothyroidism   . Osteoarthritis    lumbar disc dz, cervical disc dz, hands  . Osteoporosis    intolerance to fosamax, evista and miacalcin  . Peptic ulcer disease   . Pernicious anemia   . Vitamin D deficiency     SURGICAL HISTORY: Past Surgical History:  Procedure Laterality Date  . ABDOMINAL HYSTERECTOMY     with benign ovarian  tumor removed.    Marland Kitchen FLEXIBLE BRONCHOSCOPY N/A 06/16/2016   Procedure: FLEXIBLE BRONCHOSCOPY;  Surgeon: Allyne Gee, MD;  Location: [facility] ORS;  Service: Pulmonary;  Laterality: N/A;  . LUMBAR FUSION      SOCIAL HISTORY:Retired Network engineer. Lives in [place]- 2 daughters; quit smoking in 1985. No alcohol. Social History   Social History  . Marital status: Widowed    Spouse name: N/A  . Number of children: 2  . Years of education: N/A   Occupational History  . Not on file.   Social History Main Topics  . Smoking status: Former Smoker    Years: 15.00    Types: Cigarettes  . Smokeless tobacco: Never Used  . Alcohol use No  . Drug use: No  . Sexual activity: Not on file   Other Topics Concern  . Not on file   Social History Narrative  . No narrative on file    FAMILY HISTORY: Family History  Problem Relation Age of Onset  . Heart disease Father     myocardial infarction  . Heart attack Father   . Cancer Mother     cancer of lymph nodes and kidney  . Breast cancer Sister   . Colon cancer Neg Hx     ALLERGIES:  is allergic to penicillins; augmentin [amoxicillin-pot clavulanate]; evista [raloxifene]; fosamax [alendronate sodium]; lipitor [atorvastatin]; miralax [polyethylene glycol]; and zetia [ezetimibe].  MEDICATIONS:  Current Outpatient Prescriptions  Medication Sig  Dispense Refill  . albuterol (PROAIR HFA) 108 (90 BASE) MCG/ACT inhaler Inhale 1 puff into the lungs every 6 (six) hours as needed for wheezing or shortness of breath.    Marland Kitchen albuterol (PROVENTIL) (2.5 MG/3ML) 0.083% nebulizer solution Take 3 mLs (2.5 mg total) by nebulization 2 (two) times daily. DX: EMPHYSEMA DX CODE: J43.8 75 mL 12  . apixaban (ELIQUIS) 2.5 MG TABS tablet Take 1 tablet (2.5 mg total) by mouth 2 (two) times daily. 60 tablet 3  . BREO ELLIPTA 100-25 MCG/INH AEPB INHALE 1 PUFF BY MOUTH DAILY 30 each 4  . cyanocobalamin (,VITAMIN B-12,) 1000 MCG/ML injection Inject 1 mL (1,000 mcg total) into the  muscle every 30 (thirty) days. 10 mL 1  . HYDROcodone-acetaminophen (VICODIN) 5-325 mg TABS tablet One pill every night as needed. 60 tablet 0  . hydrOXYzine (ATARAX/VISTARIL) 25 MG tablet Take 1 tablet (25 mg total) by mouth 3 (three) times daily as needed for itching. 60 tablet 1  . ipratropium-albuterol (DUONEB) 0.5-2.5 (3) MG/3ML SOLN Take 3 mLs by nebulization as needed.    . metoprolol (LOPRESSOR) 50 MG tablet Take 1 tablet (50 mg total) by mouth 2 (two) times daily. 60 tablet 3  . montelukast (SINGULAIR) 10 MG tablet Take 1 tablet (10 mg total) by mouth at bedtime. 90 tablet 1  . nitroGLYCERIN (NITROSTAT) 0.4 MG SL tablet Place 1 tablet (0.4 mg total) under the tongue every 5 (five) minutes as needed for chest pain. 25 tablet 2  . ondansetron (ZOFRAN) 4 MG tablet TK 1 T PO BID PRF NAUSEA  1  . spironolactone (ALDACTONE) 25 MG tablet TAKE 1 TABLET BY MOUTH DAILY 30 tablet 3  . tiotropium (SPIRIVA) 18 MCG inhalation capsule Place 1 capsule (18 mcg total) into inhaler and inhale daily. 30 capsule 5  . triamcinolone ointment (KENALOG) 0.5 % Apply 1 application topically 2 (two) times daily. 30 g 0   No current facility-administered medications for this visit.    Facility-Administered Medications Ordered in Other Visits  Medication Dose Route Frequency Provider Last Rate Last Dose  . 0.9 %  sodium chloride infusion   Intravenous Once Cammie Sickle, MD      . pembrolizumab [facility]) 200 mg in sodium chloride 0.9 % 50 mL chemo infusion  200 mg Intravenous Once Cammie Sickle, MD          .  PHYSICAL EXAMINATION: ECOG PERFORMANCE STATUS: 1 - Symptomatic but completely ambulatory  Vitals:   01/12/17 1351  BP: 122/68  Pulse: 92  Resp: 18  Temp: 97.8 F (36.6 C)   Filed Weights   01/12/17 1351  Weight: 124 lb 8 oz (56.5 kg)    GENERAL: Well-nourished well-developed; Alert, no distress and comfortable.  With a daughter.She is in a wheelchair. She is on 3 L of  oxygen. EYES: no pallor or icterus OROPHARYNX: no thrush or ulceration; good dentition  NECK: supple, no masses felt LYMPH:  no palpable lymphadenopathy in the cervical, axillary or inguinal regions LUNGS: Decreased breath sounds on the left side compared to the right.  HEART/CVS: irregular rhythm and no murmurs; No lower extremity edema ABDOMEN: abdomen soft, non-tender and normal bowel sounds Musculoskeletal:no cyanosis of digits and no clubbing  PSYCH: alert & oriented x 3 with fluent speech NEURO: no focal motor/sensory deficits  LABORATORY DATA:  I have reviewed the data as listed Lab Results  Component Value Date   WBC 7.8 01/12/2017   HGB 12.6 01/12/2017   HCT 37.7  01/12/2017   MCV 80.8 01/12/2017   PLT 248 01/12/2017    Recent Labs  11/05/16 1329 12/01/16 1308 12/22/16 1315 01/12/17 1327  NA 133* 135 134* 133*  K 4.2 4.1 4.3 4.6  CL 100* 102 102 101  CO2 25 27 29 26   GLUCOSE 95 91 103* 87  BUN 21* 18 20 22*  CREATININE 1.14* 1.05* 1.06* 1.28*  CALCIUM 8.9 9.2 9.1 9.5  GFRNONAA 43* 47* 47* 37*  GFRAA 49* 55* 54* 43*  PROT 6.8  --  7.2 7.5  ALBUMIN 3.5  --  3.9 4.0  AST 17  --  19 22  ALT 8*  --  10* 10*  ALKPHOS 91  --  103 86  BILITOT 0.5  --  0.5 0.6    RADIOGRAPHIC STUDIES: I have personally reviewed the radiological images as listed and agreed with the findings in the report. No results found.  ASSESSMENT & PLAN:   Primary cancer of left upper lobe of lung ([place])  # Squamous cell carcinoma of the left upper lobe with atelectasis left upper lung; and also contralateral lung nodules- stage IV lung cancer. On palliative [place].  No evidence of any clinical progression at this time.  # proceed with cycle #7 of keytruda. Labs today reviewed;  acceptable for treatment today. TSH- feb 2018-N.   # SOB/COPD/ Lung ca/chronic - Still on 4L continuous oxygen therapy. pulx on today 96%; unable to weaning off. refrral to Dr.Simonds.   # Chronic back pain-  hydrocodone prn qhs. Prescription given.  # chronic Afib on eliquis/stable.  # CKD- stage III- creat 1.3/stable.   # Follow-up in 3 weeks cycle # of Keytruda/labs; CT scan prior. 6 weeks.        Cammie Sickle, MD 01/12/2017 2:45 PM

## 2017-01-12 NOTE — Assessment & Plan Note (Addendum)
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 On palliative [place].  No evidence of any clinical progression at this time.  # proceed with cycle #7 of keytruda. Labs today reviewed;  acceptable for treatment today. TSH- feb 2018-N.   # SOB/COPD/ Lung ca/chronic - Still on 4L continuous oxygen therapy. pulx on today 96%; unable to weaning off. refrral to Dr.Simonds.   # Chronic back pain- hydrocodone prn qhs. Prescription given.  # chronic Afib on eliquis/stable.  # CKD- stage III- creat 1.3/stable.   # Follow-up in 3 weeks cycle # of Keytruda/labs; CT scan prior. 6 weeks.

## 2017-01-14 NOTE — Telephone Encounter (Signed)
Patient still in to be scheduled wq please advise which physician to schedule .

## 2017-01-16 DIAGNOSIS — J449 Chronic obstructive pulmonary disease, unspecified: Secondary | ICD-10-CM | POA: Diagnosis not present

## 2017-01-17 ENCOUNTER — Telehealth: Payer: Self-pay | Admitting: Internal Medicine

## 2017-01-17 NOTE — Telephone Encounter (Signed)
Pt declined AWV at this time.

## 2017-01-17 NOTE — Telephone Encounter (Signed)
Pt wants to start seeing Dr. Alva Garnet. Please advise if this is ok with you. Thanks.

## 2017-01-17 NOTE — Telephone Encounter (Signed)
Pt can switch to Dr. Alva Garnet but she will need a 30 minute appt per [facility].

## 2017-01-17 NOTE — Telephone Encounter (Signed)
Ok for switch. But no  backsies.

## 2017-01-19 ENCOUNTER — Telehealth: Payer: Self-pay | Admitting: Cardiovascular Disease

## 2017-01-19 NOTE — Telephone Encounter (Signed)
Patient is having a dental extraction 01-24-17.  She takes eliquis 5 mg po bid.  Please call to let daughter Alice Delgado know if and when she should hold this for procedure.

## 2017-01-19 NOTE — Telephone Encounter (Signed)
No answer. Left message letting her know we will forward to Dr Fletcher Anon or covering MD to address.

## 2017-01-20 NOTE — Telephone Encounter (Signed)
Pt's daughter Gwyndolyn Saxon, on [place], reports pt having single tooth extraction April 9 at [facility]. Pt taking eliquis '5mg'$  for aflutter and would like to know if pt needs to hold this medication. I s/w Lattie Haw at [facility]. Pt has a consultation April 9. States they will not same day extract but will have pt call to advise regarding holding blood thinner after number of extractions is determined.   Daughter reports she is sure it's a single tooth extraction but will call after April 9 OV.

## 2017-01-24 ENCOUNTER — Telehealth: Payer: Self-pay | Admitting: Internal Medicine

## 2017-01-24 NOTE — Telephone Encounter (Signed)
Spoke to dentistry re: tooth extraction; recommend total of 4 days off [already off for 2 days] eliquis prior to tooth extraction.

## 2017-01-27 ENCOUNTER — Telehealth: Payer: Self-pay | Admitting: *Deleted

## 2017-01-27 NOTE — Telephone Encounter (Signed)
Had tooth pulled yesterday, asking when she is to restart her Eliquis, she is still having a little bit of bleeding at extraction site today. Please advise

## 2017-01-28 NOTE — Telephone Encounter (Signed)
Informed Lori per VO Dr B to hold Eliquis 4 more days or until extraction site heals over. Had to leave a VM.

## 2017-01-31 ENCOUNTER — Ambulatory Visit
Admission: RE | Admit: 2017-01-31 | Discharge: 2017-01-31 | Disposition: A | Discharge status: Home / Self Care | Payer: Medicare Other | Source: Ambulatory Visit | Attending: Internal Medicine | Admitting: Internal Medicine

## 2017-01-31 DIAGNOSIS — R590 Localized enlarged lymph nodes: Secondary | ICD-10-CM | POA: Diagnosis not present

## 2017-01-31 DIAGNOSIS — C3412 Malignant neoplasm of upper lobe, left bronchus or lung: Secondary | ICD-10-CM | POA: Diagnosis not present

## 2017-01-31 DIAGNOSIS — R918 Other nonspecific abnormal finding of lung field: Secondary | ICD-10-CM | POA: Insufficient documentation

## 2017-01-31 DIAGNOSIS — J9819 Other pulmonary collapse: Secondary | ICD-10-CM | POA: Diagnosis not present

## 2017-01-31 DIAGNOSIS — C3492 Malignant neoplasm of unspecified part of left bronchus or lung: Secondary | ICD-10-CM | POA: Diagnosis not present

## 2017-01-31 DIAGNOSIS — J90 Pleural effusion, not elsewhere classified: Secondary | ICD-10-CM | POA: Diagnosis not present

## 2017-02-01 ENCOUNTER — Other Ambulatory Visit: Payer: Self-pay | Admitting: Cardiovascular Disease

## 2017-02-02 ENCOUNTER — Other Ambulatory Visit: Payer: Medicare Other

## 2017-02-02 ENCOUNTER — Telehealth: Payer: Self-pay | Admitting: Cardiovascular Disease

## 2017-02-02 ENCOUNTER — Ambulatory Visit: Payer: Medicare Other

## 2017-02-02 ENCOUNTER — Ambulatory Visit: Payer: Medicare Other | Admitting: Internal Medicine

## 2017-02-02 NOTE — Telephone Encounter (Signed)
Eliquis 2.5 mg tablet placed at front desk for pick up.

## 2017-02-02 NOTE — Telephone Encounter (Signed)
Pt daughter calling   Patient calling the office for samples of medication:   1.  What medication and dosage are you requesting samples for? eliquis 2.5  2.  Are you currently out of this medication?  Just about

## 2017-02-04 ENCOUNTER — Encounter: Payer: Self-pay | Admitting: Internal Medicine

## 2017-02-04 ENCOUNTER — Inpatient Hospital Stay: Payer: Medicare Other

## 2017-02-04 ENCOUNTER — Inpatient Hospital Stay (HOSPITAL_BASED_OUTPATIENT_CLINIC_OR_DEPARTMENT_OTHER): Payer: Medicare Other | Admitting: Internal Medicine

## 2017-02-04 ENCOUNTER — Inpatient Hospital Stay: Payer: Medicare Other | Attending: Internal Medicine

## 2017-02-04 DIAGNOSIS — D510 Vitamin B12 deficiency anemia due to intrinsic factor deficiency: Secondary | ICD-10-CM | POA: Insufficient documentation

## 2017-02-04 DIAGNOSIS — J449 Chronic obstructive pulmonary disease, unspecified: Secondary | ICD-10-CM | POA: Diagnosis not present

## 2017-02-04 DIAGNOSIS — Z7901 Long term (current) use of anticoagulants: Secondary | ICD-10-CM | POA: Insufficient documentation

## 2017-02-04 DIAGNOSIS — I129 Hypertensive chronic kidney disease with stage 1 through stage 4 chronic kidney disease, or unspecified chronic kidney disease: Secondary | ICD-10-CM | POA: Diagnosis not present

## 2017-02-04 DIAGNOSIS — K219 Gastro-esophageal reflux disease without esophagitis: Secondary | ICD-10-CM | POA: Insufficient documentation

## 2017-02-04 DIAGNOSIS — Z79899 Other long term (current) drug therapy: Secondary | ICD-10-CM

## 2017-02-04 DIAGNOSIS — N183 Chronic kidney disease, stage 3 (moderate): Secondary | ICD-10-CM | POA: Diagnosis not present

## 2017-02-04 DIAGNOSIS — C3412 Malignant neoplasm of upper lobe, left bronchus or lung: Secondary | ICD-10-CM

## 2017-02-04 DIAGNOSIS — Z880 Allergy status to penicillin: Secondary | ICD-10-CM | POA: Diagnosis not present

## 2017-02-04 DIAGNOSIS — E039 Hypothyroidism, unspecified: Secondary | ICD-10-CM | POA: Diagnosis not present

## 2017-02-04 DIAGNOSIS — C771 Secondary and unspecified malignant neoplasm of intrathoracic lymph nodes: Secondary | ICD-10-CM | POA: Insufficient documentation

## 2017-02-04 DIAGNOSIS — E559 Vitamin D deficiency, unspecified: Secondary | ICD-10-CM | POA: Diagnosis not present

## 2017-02-04 DIAGNOSIS — R110 Nausea: Secondary | ICD-10-CM | POA: Diagnosis not present

## 2017-02-04 DIAGNOSIS — R05 Cough: Secondary | ICD-10-CM

## 2017-02-04 DIAGNOSIS — Z87891 Personal history of nicotine dependence: Secondary | ICD-10-CM | POA: Insufficient documentation

## 2017-02-04 DIAGNOSIS — I482 Chronic atrial fibrillation: Secondary | ICD-10-CM | POA: Insufficient documentation

## 2017-02-04 DIAGNOSIS — M1990 Unspecified osteoarthritis, unspecified site: Secondary | ICD-10-CM | POA: Insufficient documentation

## 2017-02-04 DIAGNOSIS — Z5112 Encounter for antineoplastic immunotherapy: Secondary | ICD-10-CM | POA: Insufficient documentation

## 2017-02-04 DIAGNOSIS — E7800 Pure hypercholesterolemia, unspecified: Secondary | ICD-10-CM | POA: Insufficient documentation

## 2017-02-04 LAB — COMPREHENSIVE METABOLIC PANEL
ALBUMIN: 4.1 g/dL (ref 3.5–5.0)
ALT: 15 U/L (ref 14–54)
AST: 31 U/L (ref 15–41)
Alkaline Phosphatase: 89 U/L (ref 38–126)
Anion gap: 8 (ref 5–15)
BILIRUBIN TOTAL: 0.5 mg/dL (ref 0.3–1.2)
BUN: 21 mg/dL — AB (ref 6–20)
CHLORIDE: 96 mmol/L — AB (ref 101–111)
CO2: 25 mmol/L (ref 22–32)
Calcium: 9.5 mg/dL (ref 8.9–10.3)
Creatinine, Ser: 1.48 mg/dL — ABNORMAL HIGH (ref 0.44–1.00)
GFR calc Af Amer: 36 mL/min — ABNORMAL LOW (ref >60)
GFR calc non Af Amer: 31 mL/min — ABNORMAL LOW (ref >60)
GLUCOSE: 89 mg/dL (ref 65–99)
POTASSIUM: 4.8 mmol/L (ref 3.5–5.1)
SODIUM: 129 mmol/L — AB (ref 135–145)
Total Protein: 7.5 g/dL (ref 6.5–8.1)

## 2017-02-04 LAB — CBC WITH DIFFERENTIAL/PLATELET
BASOS ABS: 0 10*3/uL (ref 0–0.1)
BASOS PCT: 1 %
EOS ABS: 0.7 10*3/uL (ref 0–0.7)
EOS PCT: 9 %
HCT: 40 % (ref 35.0–47.0)
Hemoglobin: 13.6 g/dL (ref 12.0–16.0)
LYMPHS PCT: 7 %
Lymphs Abs: 0.5 10*3/uL — ABNORMAL LOW (ref 1.0–3.6)
MCH: 27.6 pg (ref 26.0–34.0)
MCHC: 33.8 g/dL (ref 32.0–36.0)
MCV: 81.5 fL (ref 80.0–100.0)
MONO ABS: 0.8 10*3/uL (ref 0.2–0.9)
Monocytes Relative: 10 %
Neutro Abs: 5.8 10*3/uL (ref 1.4–6.5)
Neutrophils Relative %: 73 %
PLATELETS: 230 10*3/uL (ref 150–440)
RBC: 4.91 MIL/uL (ref 3.80–5.20)
RDW: 16.1 % — ABNORMAL HIGH (ref 11.5–14.5)
WBC: 7.9 10*3/uL (ref 3.6–11.0)

## 2017-02-04 MED ORDER — SODIUM CHLORIDE 0.9 % IV SOLN
200.0000 mg | Freq: Once | INTRAVENOUS | Status: AC
  Administered 2017-02-04: 200 mg via INTRAVENOUS
  Filled 2017-02-04: qty 8

## 2017-02-04 MED ORDER — SODIUM CHLORIDE 0.9 % IV SOLN
Freq: Once | INTRAVENOUS | Status: AC
  Administered 2017-02-04: 13:00:00 via INTRAVENOUS
  Filled 2017-02-04: qty 1000

## 2017-02-04 NOTE — Progress Notes (Signed)
Alice Delgado NOTE  Patient Care Team: Einar Pheasant, MD as PCP - General (Internal Medicine) Wellington Hampshire, MD as Consulting Physician (Cardiology)  CHIEF COMPLAINTS/PURPOSE OF CONSULTATION: Lung cancer  #  Oncology History   # SEP 2017- SQUAMOUS CELL CA [s/p Bronch Dr.S Khan];PET - LEFT UPPER LOBE MASS- left upper lung collpase; mediastinal LN; Contralateral Right nodules [x3];STAGE IV [contralateral lung nodules]   # PDL-1- 90% [Keytruda]  # Hx of smoking/ COPD/ CKD [creat ~ 1.5]     Primary cancer of left upper lobe of lung ([place])   07/06/2016 Initial Diagnosis    Primary cancer of left upper lobe of lung ([facility])       HISTORY OF PRESENTING ILLNESS:  Alice Delgado 83 y.o.  female with newly diagnosed squamous cell lung cancer stage IV; currently on palliative Beryle Flock is here for a follow up/ Review the results of the restaging CAT scan.  Patient continues to be on 3.5-40 and is approximately. Patient continues to have mild shortness of breath with exertion. She has intermittent episodes of cough. No hemoptysis. No fevers. No night sweats. No diarrhea or headaches. Complains of fatigue. No diarrhea. Mild intermittent nausea. No vomiting  ROS: A complete 10 point review of system is done which is negative except mentioned above in history of present illness  MEDICAL HISTORY:  Past Medical History:  Diagnosis Date  . Asthma   . Diverticulosis   . GERD (gastroesophageal reflux disease)   . Hiatal hernia   . Hypercholesterolemia   . Hyperglycemia   . Hypertension   . Hypothyroidism   . Osteoarthritis    lumbar disc dz, cervical disc dz, hands  . Osteoporosis    intolerance to fosamax, evista and miacalcin  . Peptic ulcer disease   . Pernicious anemia   . Vitamin D deficiency     SURGICAL HISTORY: Past Surgical History:  Procedure Laterality Date  . ABDOMINAL HYSTERECTOMY     with benign ovarian tumor removed.    Marland Kitchen FLEXIBLE  BRONCHOSCOPY N/A 06/16/2016   Procedure: FLEXIBLE BRONCHOSCOPY;  Surgeon: Allyne Gee, MD;  Location: [facility] ORS;  Service: Pulmonary;  Laterality: N/A;  . LUMBAR FUSION      SOCIAL HISTORY:Retired Network engineer. Lives in [place]- 2 daughters; quit smoking in 1985. No alcohol. Social History   Social History  . Marital status: Widowed    Spouse name: N/A  . Number of children: 2  . Years of education: N/A   Occupational History  . Not on file.   Social History Main Topics  . Smoking status: Former Smoker    Years: 15.00    Types: Cigarettes  . Smokeless tobacco: Never Used  . Alcohol use No  . Drug use: No  . Sexual activity: Not on file   Other Topics Concern  . Not on file   Social History Narrative  . No narrative on file    FAMILY HISTORY: Family History  Problem Relation Age of Onset  . Heart disease Father     myocardial infarction  . Heart attack Father   . Cancer Mother     cancer of lymph nodes and kidney  . Breast cancer Sister   . Colon cancer Neg Hx     ALLERGIES:  is allergic to penicillins; augmentin [amoxicillin-pot clavulanate]; evista [raloxifene]; fosamax [alendronate sodium]; lipitor [atorvastatin]; miralax [polyethylene glycol]; and zetia [ezetimibe].  MEDICATIONS:  Current Outpatient Prescriptions  Medication Sig Dispense Refill  . albuterol (PROAIR HFA) 108 (90  BASE) MCG/ACT inhaler Inhale 1 puff into the lungs every 6 (six) hours as needed for wheezing or shortness of breath.    Marland Kitchen albuterol (PROVENTIL) (2.5 MG/3ML) 0.083% nebulizer solution Take 3 mLs (2.5 mg total) by nebulization 2 (two) times daily. DX: EMPHYSEMA DX CODE: J43.8 75 mL 12  . apixaban (ELIQUIS) 2.5 MG TABS tablet Take 1 tablet (2.5 mg total) by mouth 2 (two) times daily. 60 tablet 3  . BREO ELLIPTA 100-25 MCG/INH AEPB INHALE 1 PUFF BY MOUTH DAILY 30 each 4  . cyanocobalamin (,VITAMIN B-12,) 1000 MCG/ML injection Inject 1 mL (1,000 mcg total) into the muscle every 30 (thirty)  days. 10 mL 1  . HYDROcodone-acetaminophen (VICODIN) 5-325 mg TABS tablet One pill every night as needed. 60 tablet 0  . hydrOXYzine (ATARAX/VISTARIL) 25 MG tablet Take 1 tablet (25 mg total) by mouth 3 (three) times daily as needed for itching. 60 tablet 1  . ipratropium-albuterol (DUONEB) 0.5-2.5 (3) MG/3ML SOLN Take 3 mLs by nebulization as needed.    . metoprolol (LOPRESSOR) 50 MG tablet TAKE 1 TABLET(50 MG) BY MOUTH TWICE DAILY 60 tablet 0  . montelukast (SINGULAIR) 10 MG tablet Take 1 tablet (10 mg total) by mouth at bedtime. 90 tablet 1  . nitroGLYCERIN (NITROSTAT) 0.4 MG SL tablet Place 1 tablet (0.4 mg total) under the tongue every 5 (five) minutes as needed for chest pain. 25 tablet 2  . ondansetron (ZOFRAN) 4 MG tablet TK 1 T PO BID PRF NAUSEA  1  . spironolactone (ALDACTONE) 25 MG tablet TAKE 1 TABLET BY MOUTH DAILY 30 tablet 3  . tiotropium (SPIRIVA) 18 MCG inhalation capsule Place 1 capsule (18 mcg total) into inhaler and inhale daily. 30 capsule 5  . triamcinolone ointment (KENALOG) 0.5 % Apply 1 application topically 2 (two) times daily. 30 g 0   No current facility-administered medications for this visit.       Marland Kitchen  PHYSICAL EXAMINATION: ECOG PERFORMANCE STATUS: 1 - Symptomatic but completely ambulatory  Vitals:   02/04/17 1140  BP: 119/82  Pulse: 74  Temp: 97.6 F (36.4 C)   Filed Weights   02/04/17 1140  Weight: 129 lb 6 oz (58.7 kg)    GENERAL: Well-nourished well-developed; Alert, no distress and comfortable.  With a daughter.She is in a wheelchair. She is on 3 L of oxygen. EYES: no pallor or icterus OROPHARYNX: no thrush or ulceration; good dentition  NECK: supple, no masses felt LYMPH:  no palpable lymphadenopathy in the cervical, axillary or inguinal regions LUNGS: Decreased breath sounds on the left side compared to the right.  HEART/CVS: irregular rhythm and no murmurs; No lower extremity edema ABDOMEN: abdomen soft, non-tender and normal bowel  sounds Musculoskeletal:no cyanosis of digits and no clubbing  PSYCH: alert & oriented x 3 with fluent speech NEURO: no focal motor/sensory deficits  LABORATORY DATA:  I have reviewed the data as listed Lab Results  Component Value Date   WBC 7.9 02/04/2017   HGB 13.6 02/04/2017   HCT 40.0 02/04/2017   MCV 81.5 02/04/2017   PLT 230 02/04/2017    Recent Labs  12/22/16 1315 01/12/17 1327 02/04/17 1100  NA 134* 133* 129*  K 4.3 4.6 4.8  CL 102 101 96*  CO2 _0 GLUCOSE 103* 87 89  BUN 20 22* 21*  CREATININE 1.06* 1.28* 1.48*  CALCIUM 9.1 9.5 9.5  GFRNONAA 47* 37* 31*  GFRAA 54* 43* 36*  PROT 7.2 7.5 7.5  ALBUMIN 3.9 4.0  4.1  AST _0 ALT 10* 10* 15  ALKPHOS 103 86 89  BILITOT 0.5 0.6 0.5    RADIOGRAPHIC STUDIES: I have personally reviewed the radiological images as listed and agreed with the findings in the report. Ct Chest Wo Contrast  Result Date: 01/31/2017 CLINICAL DATA:  Followup bilateral lung carcinoma. Left upper lobe squamous cell carcinoma undergoing immunotherapy. Previous radiation therapy. EXAM: CT CHEST WITHOUT CONTRAST TECHNIQUE: Multidetector CT imaging of the chest was performed following the standard protocol without IV contrast. COMPARISON:  11/05/2016 FINDINGS: Cardiovascular: No acute findings. Aortic and coronary artery atherosclerosis. No evidence of pericardial effusion. Mediastinum/Nodes: New mediastinal lymphadenopathy is seen in the right paratracheal region measuring 1.3 cm on image 48/2, and in the subcarinal region measuring 2.3 cm on image 59/2. Lungs/Pleura: 4.0 x 3.6 cm mass in the central left upper lobe and mediastinum on image 56/2 is mildly increased in size compared to 4.0 x 2.6 cm previously. There is now complete obstruction of the central left upper lobe bronchus and complete left upper lobe collapse. Mild increase in opacity in the left perihilar region with associated bronchiectasis, likely due to previous radiation therapy.  Previously seen left lower lobe airspace disease has resolved, and small left pleural effusion is decreased in size. A 6 mm pulmonary nodule is seen in the posterior left lower lobe on image 32/3 which is not been seen on previous studies. Spiculated nodule in posterior right upper lobe measuring 2.5 x 1.6 cm on image 30/3 shows no significant change compared to prior study. A spiculated nodule in the superior right lower lobe with central cavitation on image 71/3 measures 2.0 x 1.4 cm, compared to 2.1 x 1.2 cm previously. New 5 mm pulmonary nodule in the posterior right lower lobe on image 60/3. Mild emphysema again noted. Upper Abdomen:  Unremarkable. Musculoskeletal:  No suspicious bone lesions. IMPRESSION: New mild right paratracheal and subcarinal mediastinal lymphadenopathy. Mild increase in size of central left upper lobe and mediastinal mass, with complete collapse of left upper lobe. New sub-cm bilateral lower lobe pulmonary nodules. No significant change in spiculated nodules in the right upper and lower lobes. Interval decrease in left lower lobe airspace disease and small left pleural effusion. Electronically Signed   By: Earle Gell M.D.   On: 01/31/2017 15:57    ASSESSMENT & PLAN:   Primary cancer of left upper lobe of lung ([place])  # Squamous cell carcinoma of the left upper lobe with atelectasis left upper lung; and also contralateral lung nodules- stage IV lung cancer. On palliative [place].  # No evidence of any clinical progression at this time; HOWEVER- CT scan April 16th 2018- CT shows slight increase in the left LUL central mass/ ~4.5cm; chronic left upper lobe collapse. Also shows a tiny 5 mm new lung Nodule.  # Had a long discussion with the patient and family regarding the rationale for continuing Keytruda- is the absence of any other good options except chemotherapy; which I think patient report candidate; and also the fact the patient is clinically doing well. They agree.  #  proceed with cycle #7 of keytruda. Labs today reviewed;  acceptable for treatment today. TSH- feb 2018-N.   # SOB/COPD/ Lung ca/chronic - Still on 4L continuous oxygen therapy. pulx on today 96%; stable.  # mild nausea- continue prn anti-emetics  # chronic Afib on eliquis/stable.  # CKD- stage III- creat 1.3/stable.   # Follow-up in 3 weeks cycle # of Keytruda/labs;   #  I reviewed the blood work- with the patient in detail; also reviewed the imaging independently [as summarized above]; and with the patient in detail.        Cammie Sickle, MD 02/06/2017 6:57 PM

## 2017-02-04 NOTE — Progress Notes (Signed)
Patient here today for follow up.   

## 2017-02-04 NOTE — Assessment & Plan Note (Addendum)
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 On palliative [place].  # No evidence of any clinical progression at this time; HOWEVER- CT scan April 16th 2018- CT shows slight increase in the left LUL central mass/ ~4.5cm; chronic left upper lobe collapse. Also shows a tiny 5 mm new lung Nodule.  # Had a long discussion with the patient and family regarding the rationale for continuing Keytruda- is the absence of any other good options except chemotherapy; which I think patient report candidate; and also the fact the patient is clinically doing well. They agree.  # proceed with cycle #7 of keytruda. Labs today reviewed;  acceptable for treatment today. TSH- feb 2018-N.   # SOB/COPD/ Lung ca/chronic - Still on 4L continuous oxygen therapy. pulx on today 96%; stable.  # mild nausea- continue prn anti-emetics  # chronic Afib on eliquis/stable.  # CKD- stage III- creat 1.3/stable.   # Follow-up in 3 weeks cycle # of Keytruda/labs;   # I reviewed the blood work- with the patient in detail; also reviewed the imaging independently [as summarized above]; and with the patient in detail.

## 2017-02-07 ENCOUNTER — Ambulatory Visit
Admission: RE | Admit: 2017-02-07 | Discharge: 2017-02-07 | Disposition: A | Discharge status: Home / Self Care | Payer: Medicare Other | Source: Ambulatory Visit | Attending: Radiation Oncology | Admitting: Radiation Oncology

## 2017-02-07 ENCOUNTER — Encounter: Payer: Self-pay | Admitting: Radiation Oncology

## 2017-02-07 VITALS — BP 140/88 | HR 95 | Temp 97.4°F | Resp 21

## 2017-02-07 DIAGNOSIS — Z923 Personal history of irradiation: Secondary | ICD-10-CM | POA: Insufficient documentation

## 2017-02-07 DIAGNOSIS — C3412 Malignant neoplasm of upper lobe, left bronchus or lung: Secondary | ICD-10-CM | POA: Insufficient documentation

## 2017-02-07 DIAGNOSIS — C7800 Secondary malignant neoplasm of unspecified lung: Secondary | ICD-10-CM | POA: Diagnosis not present

## 2017-02-07 DIAGNOSIS — Z9981 Dependence on supplemental oxygen: Secondary | ICD-10-CM | POA: Diagnosis not present

## 2017-02-07 DIAGNOSIS — Z87891 Personal history of nicotine dependence: Secondary | ICD-10-CM | POA: Diagnosis not present

## 2017-02-07 NOTE — Progress Notes (Signed)
Radiation Oncology Follow up Note  Name: Alice Delgado   Date:   02/07/2017 MRN:  [id number] DOB: [birth year]    This 83 y.o. female presents to the clinic today for  Six-month follow-up for stage IV squamous cell carcinoma with hypofractionated course to left upper lobe.  REFERRING PROVIDER: Lavera Guise, MD  HPI:  Patient is an 83 year old female now out 6 months having completed radiation therapy to her left upper lobe for squamous cell carcinoma. She is currently on Keytruda  And tolerating that well with slow progression in her chest bilaterally with nothing significant at this time requiring palliation. She's currently on nasal oxygen. She specifically denies cough hemoptysis chest tightness. Recent CT scan of the chest showed mild right paratracheal subcarinal lymphadenopathy with also mild increase in central left upper lobe and mediastinal mass with no significant change in spiculated nodules in right upper and lower lobes.  COMPLICATIONS OF TREATMENT: none  FOLLOW UP COMPLIANCE: keeps appointments   PHYSICAL EXAM:  BP 140/88   Pulse 95   Temp 97.4 F (36.3 C)   Resp (!) 21   SpO2 95%   Well-developed elderly female on nasal oxygen in NAD. Well-developed well-nourished patient in NAD. HEENT reveals PERLA, EOMI, discs not visualized.  Oral cavity is clear. No oral mucosal lesions are identified. Neck is clear without evidence of cervical or supraclavicular adenopathy. Lungs are clear to A&P. Cardiac examination is essentially unremarkable with regular rate and rhythm without murmur rub or thrill. Abdomen is benign with no organomegaly or masses noted. Motor sensory and DTR levels are equal and symmetric in the upper and lower extremities. Cranial nerves II through XII are grossly intact. Proprioception is intact. No peripheral adenopathy or edema is identified. No motor or sensory levels are noted. Crude visual fields are within normal range.  RADIOLOGY RESULTS: Recent CT scans  reviewed and compared to prior studies  PLAN:  Present time patient is doing well on immunotherapy. I'm going to turn follow-up care over to medical oncology. We'll be happy to reevaluate the patient any time should further palliative treatment be indicated. She seems to be tolerating her immunotherapy well with only slight progression of disease. Patient knows to call with any concerns.  I would like to take this opportunity to thank you for allowing me to participate in the care of your patient.Armstead Peaks., MD

## 2017-02-11 DIAGNOSIS — J449 Chronic obstructive pulmonary disease, unspecified: Secondary | ICD-10-CM | POA: Diagnosis not present

## 2017-02-15 DIAGNOSIS — J449 Chronic obstructive pulmonary disease, unspecified: Secondary | ICD-10-CM | POA: Diagnosis not present

## 2017-02-24 ENCOUNTER — Ambulatory Visit (INDEPENDENT_AMBULATORY_CARE_PROVIDER_SITE_OTHER): Payer: Medicare Other | Admitting: Cardiovascular Disease

## 2017-02-24 ENCOUNTER — Encounter: Payer: Self-pay | Admitting: Cardiovascular Disease

## 2017-02-24 VITALS — BP 134/62 | HR 69 | Ht 61.0 in | Wt 131.8 lb

## 2017-02-24 DIAGNOSIS — I10 Essential (primary) hypertension: Secondary | ICD-10-CM | POA: Diagnosis not present

## 2017-02-24 DIAGNOSIS — I4892 Unspecified atrial flutter: Secondary | ICD-10-CM | POA: Diagnosis not present

## 2017-02-24 NOTE — Progress Notes (Signed)
Cardiology Office Note   Date:  02/24/2017   ID:  Alice Delgado, DOB January 08, 1929, MRN [id number]  PCP:  Einar Pheasant, MD  Cardiologist:   Kathlyn Sacramento, MD   Chief Complaint  Patient presents with  . other    3 month follow up. Patient c/o SOB. Meds reviewede verbally with patient.       History of Present Illness: Alice Delgado is a 83 y.o. female who Is here today for a follow-up visit regarding atrial flutter.  She has chronic medical conditions that include hypertension, hyperlipidemia, chronic back pain, hypothyroidism and asthma.   She is known to have refractory hypertension with negative workup for secondary hypertension.  She was diagnosed with stage IV lung cancer and currently getting palliative chemotherapy.  She is being treated with rate control for atrial flutter. She did not tolerate diltiazem very well due to nausea. Echocardiogram fortunately showed normal LV systolic function with mild to moderate tricuspid regurgitation. She has Delgado tolerating metoprolol 50 mg twice daily with good rate control. She is on anticoagulation with Eliquis. She has Delgado doing reasonably well overall with no chest pain. Dyspnea is stable. She continues to get treatment for lung cancer.  Past Medical History:  Diagnosis Date  . Asthma   . Diverticulosis   . GERD (gastroesophageal reflux disease)   . Hiatal hernia   . Hypercholesterolemia   . Hyperglycemia   . Hypertension   . Hypothyroidism   . Osteoarthritis    lumbar disc dz, cervical disc dz, hands  . Osteoporosis    intolerance to fosamax, evista and miacalcin  . Peptic ulcer disease   . Pernicious anemia   . Vitamin D deficiency     Past Surgical History:  Procedure Laterality Date  . ABDOMINAL HYSTERECTOMY     with benign ovarian tumor removed.    Marland Kitchen FLEXIBLE BRONCHOSCOPY N/A 06/16/2016   Procedure: FLEXIBLE BRONCHOSCOPY;  Surgeon: Allyne Gee, MD;  Location: [facility] ORS;  Service: Pulmonary;   Laterality: N/A;  . LUMBAR FUSION       Current Outpatient Prescriptions  Medication Sig Dispense Refill  . albuterol (PROAIR HFA) 108 (90 BASE) MCG/ACT inhaler Inhale 1 puff into the lungs every 6 (six) hours as needed for wheezing or shortness of breath.    Marland Kitchen albuterol (PROVENTIL) (2.5 MG/3ML) 0.083% nebulizer solution Take 3 mLs (2.5 mg total) by nebulization 2 (two) times daily. DX: EMPHYSEMA DX CODE: J43.8 75 mL 12  . apixaban (ELIQUIS) 2.5 MG TABS tablet Take 1 tablet (2.5 mg total) by mouth 2 (two) times daily. 60 tablet 3  . BREO ELLIPTA 100-25 MCG/INH AEPB INHALE 1 PUFF BY MOUTH DAILY 30 each 4  . cyanocobalamin (,VITAMIN B-12,) 1000 MCG/ML injection Inject 1 mL (1,000 mcg total) into the muscle every 30 (thirty) days. 10 mL 1  . HYDROcodone-acetaminophen (VICODIN) 5-325 mg TABS tablet One pill every night as needed. 60 tablet 0  . hydrOXYzine (ATARAX/VISTARIL) 25 MG tablet Take 1 tablet (25 mg total) by mouth 3 (three) times daily as needed for itching. 60 tablet 1  . ipratropium-albuterol (DUONEB) 0.5-2.5 (3) MG/3ML SOLN Take 3 mLs by nebulization as needed.    . metoprolol (LOPRESSOR) 50 MG tablet TAKE 1 TABLET(50 MG) BY MOUTH TWICE DAILY 60 tablet 0  . montelukast (SINGULAIR) 10 MG tablet Take 1 tablet (10 mg total) by mouth at bedtime. 90 tablet 1  . nitroGLYCERIN (NITROSTAT) 0.4 MG SL tablet Place 1 tablet (0.4 mg total) under  the tongue every 5 (five) minutes as needed for chest pain. 25 tablet 2  . ondansetron (ZOFRAN) 4 MG tablet TK 1 T PO BID PRF NAUSEA  1  . spironolactone (ALDACTONE) 25 MG tablet TAKE 1 TABLET BY MOUTH DAILY 30 tablet 3  . triamcinolone ointment (KENALOG) 0.5 % Apply 1 application topically 2 (two) times daily. 30 g 0   No current facility-administered medications for this visit.     Allergies:   Penicillins; Augmentin [amoxicillin-pot clavulanate]; Evista [raloxifene]; Fosamax [alendronate sodium]; Lipitor [atorvastatin]; Miralax [polyethylene glycol];  and Zetia [ezetimibe]    Social History:  The patient  reports that she has quit smoking. Her smoking use included Cigarettes. She quit after 15.00 years of use. She has never used smokeless tobacco. She reports that she does not drink alcohol or use drugs.   Family History:  The patient's family history includes Breast cancer in her sister; Cancer in her mother; Heart attack in her father; Heart disease in her father.    ROS:  Please see the history of present illness.   Otherwise, review of systems are positive for none.   All other systems are reviewed and negative.    PHYSICAL EXAM: VS:  BP 134/62 (BP Location: Left Arm, Patient Position: Sitting, Cuff Size: Normal)   Pulse 69   Ht '5\' 1"'$  (1.549 m)   Wt 131 lb 12 oz (59.8 kg)   BMI 24.89 kg/m  , BMI Body mass index is 24.89 kg/m. GEN: Well nourished, well developed, in no acute distress  HEENT: normal  Neck: no JVD, carotid bruits, or masses Cardiac: Irregularly irregular; no murmurs, rubs, or gallops,no edema  Respiratory:  Diminished breath sounds at the right base otherwise clear. normal work of breathing GI: soft, nontender, nondistended, + BS MS: no deformity or atrophy  Skin: warm and dry, no rash Neuro:  Strength and sensation are intact Psych: euthymic mood, full affect   EKG:  EKG is  ordered today. The EKG showed atrial flutter with  variable AV conduction. Ventricular rate is 69 bpm.  Recent Labs: 12/01/2016: TSH 3.263 02/04/2017: ALT 15; BUN 21; Creatinine, Ser 1.48; Hemoglobin 13.6; Platelets 230; Potassium 4.8; Sodium 129    Lipid Panel    Component Value Date/Time   CHOL 202 (H) 11/19/2013 1108   TRIG 199.0 (H) 11/19/2013 1108   HDL 44.40 11/19/2013 1108   CHOLHDL 5 11/19/2013 1108   VLDL 39.8 11/19/2013 1108   LDLCALC 122 (H) 05/29/2013 0811   LDLDIRECT 131.1 11/19/2013 1108      Wt Readings from Last 3 Encounters:  02/24/17 131 lb 12 oz (59.8 kg)  02/04/17 129 lb 6 oz (58.7 kg)  01/12/17 124 lb  8 oz (56.5 kg)        ASSESSMENT AND PLAN:  1. Chronic atrial flutter : Ventricular rate is well controlled on metoprolol. She is on anticoagulation with low dose Eliquis given her age and weight.  Given stability, avoid cardioversion due to age and lung condition. She had dental procedure done recently and was off Eliquis for 8 days. I do not think she needs to be off Eliquis that long. It can be held 2-3 days before procedure depending on depending on bleeding risk and can be resumed one day after.   2. Essential hypertension:   Blood pressure is  reasonably controlled on metoprolol and spironolactone.   Disposition:   FU with me in 6 months  Signed,  Kathlyn Sacramento, MD  02/24/2017 3:18 PM  [facility]

## 2017-02-24 NOTE — Patient Instructions (Signed)
Medication Instructions: Continue same medications.   Labwork: None.   Procedures/Testing: None.   Follow-Up: 6 months with Dr. Jabron Weese.   Any Additional Special Instructions Will Be Listed Below (If Applicable).     If you need a refill on your cardiac medications before your next appointment, please call your pharmacy.   

## 2017-02-25 ENCOUNTER — Ambulatory Visit
Admission: RE | Admit: 2017-02-25 | Discharge: 2017-02-25 | Disposition: A | Discharge status: Home / Self Care | Payer: Medicare Other | Source: Ambulatory Visit | Attending: Internal Medicine | Admitting: Internal Medicine

## 2017-02-25 ENCOUNTER — Inpatient Hospital Stay (HOSPITAL_BASED_OUTPATIENT_CLINIC_OR_DEPARTMENT_OTHER): Payer: Medicare Other | Admitting: Internal Medicine

## 2017-02-25 ENCOUNTER — Other Ambulatory Visit: Payer: Self-pay | Admitting: Internal Medicine

## 2017-02-25 ENCOUNTER — Inpatient Hospital Stay: Payer: Medicare Other | Attending: Internal Medicine

## 2017-02-25 ENCOUNTER — Inpatient Hospital Stay: Payer: Medicare Other

## 2017-02-25 ENCOUNTER — Encounter: Payer: Self-pay | Admitting: Internal Medicine

## 2017-02-25 VITALS — BP 108/62 | HR 59 | Temp 98.5°F | Wt 130.2 lb

## 2017-02-25 DIAGNOSIS — K219 Gastro-esophageal reflux disease without esophagitis: Secondary | ICD-10-CM | POA: Diagnosis not present

## 2017-02-25 DIAGNOSIS — Z923 Personal history of irradiation: Secondary | ICD-10-CM

## 2017-02-25 DIAGNOSIS — Z9225 Personal history of immunosupression therapy: Secondary | ICD-10-CM | POA: Insufficient documentation

## 2017-02-25 DIAGNOSIS — C3412 Malignant neoplasm of upper lobe, left bronchus or lung: Secondary | ICD-10-CM | POA: Insufficient documentation

## 2017-02-25 DIAGNOSIS — J439 Emphysema, unspecified: Secondary | ICD-10-CM | POA: Insufficient documentation

## 2017-02-25 DIAGNOSIS — Z7901 Long term (current) use of anticoagulants: Secondary | ICD-10-CM

## 2017-02-25 DIAGNOSIS — Z880 Allergy status to penicillin: Secondary | ICD-10-CM

## 2017-02-25 DIAGNOSIS — J9811 Atelectasis: Secondary | ICD-10-CM | POA: Insufficient documentation

## 2017-02-25 DIAGNOSIS — R110 Nausea: Secondary | ICD-10-CM

## 2017-02-25 DIAGNOSIS — I129 Hypertensive chronic kidney disease with stage 1 through stage 4 chronic kidney disease, or unspecified chronic kidney disease: Secondary | ICD-10-CM | POA: Diagnosis not present

## 2017-02-25 DIAGNOSIS — Z79899 Other long term (current) drug therapy: Secondary | ICD-10-CM

## 2017-02-25 DIAGNOSIS — D510 Vitamin B12 deficiency anemia due to intrinsic factor deficiency: Secondary | ICD-10-CM

## 2017-02-25 DIAGNOSIS — Z5112 Encounter for antineoplastic immunotherapy: Secondary | ICD-10-CM | POA: Diagnosis not present

## 2017-02-25 DIAGNOSIS — R197 Diarrhea, unspecified: Secondary | ICD-10-CM

## 2017-02-25 DIAGNOSIS — R0609 Other forms of dyspnea: Secondary | ICD-10-CM | POA: Diagnosis not present

## 2017-02-25 DIAGNOSIS — R262 Difficulty in walking, not elsewhere classified: Secondary | ICD-10-CM | POA: Diagnosis not present

## 2017-02-25 DIAGNOSIS — J90 Pleural effusion, not elsewhere classified: Secondary | ICD-10-CM | POA: Insufficient documentation

## 2017-02-25 DIAGNOSIS — C771 Secondary and unspecified malignant neoplasm of intrathoracic lymph nodes: Secondary | ICD-10-CM | POA: Insufficient documentation

## 2017-02-25 DIAGNOSIS — M1990 Unspecified osteoarthritis, unspecified site: Secondary | ICD-10-CM

## 2017-02-25 DIAGNOSIS — E559 Vitamin D deficiency, unspecified: Secondary | ICD-10-CM | POA: Diagnosis not present

## 2017-02-25 DIAGNOSIS — Z87891 Personal history of nicotine dependence: Secondary | ICD-10-CM | POA: Insufficient documentation

## 2017-02-25 DIAGNOSIS — R0602 Shortness of breath: Secondary | ICD-10-CM | POA: Diagnosis not present

## 2017-02-25 DIAGNOSIS — J449 Chronic obstructive pulmonary disease, unspecified: Secondary | ICD-10-CM | POA: Diagnosis not present

## 2017-02-25 DIAGNOSIS — R911 Solitary pulmonary nodule: Secondary | ICD-10-CM | POA: Insufficient documentation

## 2017-02-25 DIAGNOSIS — Z9981 Dependence on supplemental oxygen: Secondary | ICD-10-CM | POA: Diagnosis not present

## 2017-02-25 DIAGNOSIS — E039 Hypothyroidism, unspecified: Secondary | ICD-10-CM | POA: Diagnosis not present

## 2017-02-25 DIAGNOSIS — R531 Weakness: Secondary | ICD-10-CM | POA: Diagnosis not present

## 2017-02-25 DIAGNOSIS — I482 Chronic atrial fibrillation: Secondary | ICD-10-CM

## 2017-02-25 DIAGNOSIS — E7800 Pure hypercholesterolemia, unspecified: Secondary | ICD-10-CM

## 2017-02-25 DIAGNOSIS — N183 Chronic kidney disease, stage 3 (moderate): Secondary | ICD-10-CM | POA: Diagnosis not present

## 2017-02-25 DIAGNOSIS — R21 Rash and other nonspecific skin eruption: Secondary | ICD-10-CM | POA: Diagnosis not present

## 2017-02-25 LAB — COMPREHENSIVE METABOLIC PANEL
ALK PHOS: 81 U/L (ref 38–126)
ALT: 13 U/L — AB (ref 14–54)
AST: 28 U/L (ref 15–41)
Albumin: 4 g/dL (ref 3.5–5.0)
Anion gap: 8 (ref 5–15)
BILIRUBIN TOTAL: 0.7 mg/dL (ref 0.3–1.2)
BUN: 17 mg/dL (ref 6–20)
CALCIUM: 9.6 mg/dL (ref 8.9–10.3)
CO2: 26 mmol/L (ref 22–32)
CREATININE: 1.62 mg/dL — AB (ref 0.44–1.00)
Chloride: 99 mmol/L — ABNORMAL LOW (ref 101–111)
GFR calc Af Amer: 32 mL/min — ABNORMAL LOW (ref >60)
GFR, EST NON AFRICAN AMERICAN: 28 mL/min — AB (ref >60)
Glucose, Bld: 113 mg/dL — ABNORMAL HIGH (ref 65–99)
Potassium: 4.3 mmol/L (ref 3.5–5.1)
Sodium: 133 mmol/L — ABNORMAL LOW (ref 135–145)
Total Protein: 7.5 g/dL (ref 6.5–8.1)

## 2017-02-25 LAB — CBC WITH DIFFERENTIAL/PLATELET
Basophils Absolute: 0.1 10*3/uL (ref 0–0.1)
Basophils Relative: 1 %
Eosinophils Absolute: 0.5 10*3/uL (ref 0–0.7)
Eosinophils Relative: 9 %
HCT: 37.3 % (ref 35.0–47.0)
HEMOGLOBIN: 12.7 g/dL (ref 12.0–16.0)
LYMPHS ABS: 0.4 10*3/uL — AB (ref 1.0–3.6)
LYMPHS PCT: 8 %
MCH: 28.2 pg (ref 26.0–34.0)
MCHC: 34 g/dL (ref 32.0–36.0)
MCV: 83 fL (ref 80.0–100.0)
Monocytes Absolute: 0.5 10*3/uL (ref 0.2–0.9)
Monocytes Relative: 10 %
NEUTROS PCT: 72 %
Neutro Abs: 3.9 10*3/uL (ref 1.4–6.5)
Platelets: 213 10*3/uL (ref 150–440)
RBC: 4.49 MIL/uL (ref 3.80–5.20)
RDW: 17.3 % — ABNORMAL HIGH (ref 11.5–14.5)
WBC: 5.5 10*3/uL (ref 3.6–11.0)

## 2017-02-25 MED ORDER — PREDNISONE 20 MG PO TABS: ORAL_TABLET | ORAL | 0 refills | Status: DC

## 2017-02-25 NOTE — Progress Notes (Signed)
[place] NOTE  Patient Care Team: Einar Pheasant, MD as PCP - General (Internal Medicine) Wellington Hampshire, MD as Consulting Physician (Cardiology)  CHIEF COMPLAINTS/PURPOSE OF CONSULTATION: Lung cancer  #  Oncology History   # SEP 2017- SQUAMOUS CELL CA [s/p Bronch Dr.S Khan];PET - LEFT UPPER LOBE MASS- left upper lung collpase; mediastinal LN; Contralateral Right nodules [x3];STAGE IV [contralateral lung nodules]   # PDL-1- 90% [Keytruda]  # Hx of smoking/ COPD/ CKD [creat ~ 1.5]     Primary cancer of left upper lobe of lung ([place])   07/06/2016 Initial Diagnosis    Primary cancer of left upper lobe of lung ([facility])       HISTORY OF PRESENTING ILLNESS:  Alice Delgado 83 y.o.  female with newly diagnosed squamous cell lung cancer stage IV; currently on palliative Beryle Flock is here for a follow up.  As per the family patient noted to have increasing cough in the last few days. Patient continues to be on 3.5-40 and is approximately. Patient continues to have mild shortness of breath with exertion. Otherwise appetite is good. No fevers or chills. Hemoptysis.  Patient also had diarrhea in the last few days.- Approximately 5 loose stools yesterday. Today one loose stool. No recent antibiotics. Mild intermittent nausea no vomiting. She also noted to have a rash on her wrists bilaterally and hands. Itchy.  Patient concerned about financial difficulty with the treatments.  ROS: A complete 10 point review of system is done which is negative except mentioned above in history of present illness  MEDICAL HISTORY:  Past Medical History:  Diagnosis Date  . Asthma   . Diverticulosis   . GERD (gastroesophageal reflux disease)   . Hiatal hernia   . Hypercholesterolemia   . Hyperglycemia   . Hypertension   . Hypothyroidism   . Osteoarthritis    lumbar disc dz, cervical disc dz, hands  . Osteoporosis    intolerance to fosamax, evista and miacalcin  . Peptic  ulcer disease   . Pernicious anemia   . Vitamin D deficiency     SURGICAL HISTORY: Past Surgical History:  Procedure Laterality Date  . ABDOMINAL HYSTERECTOMY     with benign ovarian tumor removed.    Marland Kitchen FLEXIBLE BRONCHOSCOPY N/A 06/16/2016   Procedure: FLEXIBLE BRONCHOSCOPY;  Surgeon: Allyne Gee, MD;  Location: [facility] ORS;  Service: Pulmonary;  Laterality: N/A;  . 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 No alcohol. Social History   Social History  . Marital status: Widowed    Spouse name: N/A  . Number of children: 2  . Years of education: N/A   Occupational History  . Not on file.   Social History Main Topics  . Smoking status: Former Smoker    Years: 15.00    Types: Cigarettes  . Smokeless tobacco: Never Used  . Alcohol use No  . Drug use: No  . Sexual activity: Not on file   Other Topics Concern  . Not on file   Social History Narrative  . No narrative on file    FAMILY HISTORY: Family History  Problem Relation Age of Onset  . Heart disease Father        myocardial infarction  . Heart attack Father   . Cancer Mother        cancer of lymph nodes and kidney  . Breast cancer Sister   . Colon cancer Neg  Hx     ALLERGIES:  is allergic to penicillins; augmentin [amoxicillin-pot clavulanate]; evista [raloxifene]; fosamax [alendronate sodium]; lipitor [atorvastatin]; miralax [polyethylene glycol]; and zetia [ezetimibe].  MEDICATIONS:  Current Outpatient Prescriptions  Medication Sig Dispense Refill  . albuterol (PROAIR HFA) 108 (90 BASE) MCG/ACT inhaler Inhale 1 puff into the lungs every 6 (six) hours as needed for wheezing or shortness of breath.    Marland Kitchen albuterol (PROVENTIL) (2.5 MG/3ML) 0.083% nebulizer solution Take 3 mLs (2.5 mg total) by nebulization 2 (two) times daily. DX: EMPHYSEMA DX CODE: J43.8 75 mL 12  . apixaban (ELIQUIS) 2.5 MG TABS tablet Take 1 tablet (2.5 mg total) by mouth 2 (two)  times daily. 60 tablet 3  . BREO ELLIPTA 100-25 MCG/INH AEPB INHALE 1 PUFF BY MOUTH DAILY 30 each 4  . cyanocobalamin (,VITAMIN B-12,) 1000 MCG/ML injection Inject 1 mL (1,000 mcg total) into the muscle every 30 (thirty) days. 10 mL 1  . HYDROcodone-acetaminophen (VICODIN) 5-325 mg TABS tablet One pill every night as needed. 60 tablet 0  . hydrOXYzine (ATARAX/VISTARIL) 25 MG tablet Take 1 tablet (25 mg total) by mouth 3 (three) times daily as needed for itching. 60 tablet 1  . ipratropium-albuterol (DUONEB) 0.5-2.5 (3) MG/3ML SOLN Take 3 mLs by nebulization as needed.    . metoprolol (LOPRESSOR) 50 MG tablet TAKE 1 TABLET(50 MG) BY MOUTH TWICE DAILY 60 tablet 0  . nitroGLYCERIN (NITROSTAT) 0.4 MG SL tablet Place 1 tablet (0.4 mg total) under the tongue every 5 (five) minutes as needed for chest pain. 25 tablet 2  . ondansetron (ZOFRAN) 4 MG tablet TK 1 T PO BID PRF NAUSEA  1  . spironolactone (ALDACTONE) 25 MG tablet TAKE 1 TABLET BY MOUTH DAILY 30 tablet 3  . triamcinolone ointment (KENALOG) 0.5 % Apply 1 application topically 2 (two) times daily. 30 g 0  . montelukast (SINGULAIR) 10 MG tablet TAKE 1 TABLET(10 MG) BY MOUTH AT BEDTIME 90 tablet 0  . predniSONE (DELTASONE) 20 MG tablet 2 pills a day x 1 week; and then 1 pill once a day x 1 week; Take in AM/ with food. 21 tablet 0   No current facility-administered medications for this visit.       Marland Kitchen  PHYSICAL EXAMINATION: ECOG PERFORMANCE STATUS: 1 - Symptomatic but completely ambulatory  Vitals:   02/25/17 1136  BP: 108/62  Pulse: (!) 59  Temp: 98.5 F (36.9 C)   Filed Weights   02/25/17 1136  Weight: 130 lb 4 oz (59.1 kg)    GENERAL: Well-nourished well-developed; Alert, no distress and comfortable.  With daughters.She is in a wheelchair. She is on 4 L of oxygen. EYES: no pallor or icterus OROPHARYNX: no thrush or ulceration; good dentition  NECK: supple, no masses felt LYMPH:  no palpable lymphadenopathy in the cervical,  axillary or inguinal regions LUNGS: Decreased breath sounds on the left side compared to the right.  HEART/CVS: irregular rhythm and no murmurs; No lower extremity edema ABDOMEN: abdomen soft, non-tender and normal bowel sounds Musculoskeletal:no cyanosis of digits and no clubbing  PSYCH: alert & oriented x 3 with fluent speech NEURO: no focal motor/sensory deficits Skin- macular lesions bilaterally noted around the wrists; hands.  LABORATORY DATA:  I have reviewed the data as listed Lab Results  Component Value Date   WBC 5.5 02/25/2017   HGB 12.7 02/25/2017   HCT 37.3 02/25/2017   MCV 83.0 02/25/2017   PLT 213 02/25/2017    Recent Labs  01/12/17 1327 02/04/17  1100 02/25/17 1045  NA 133* 129* 133*  K 4.6 4.8 4.3  CL 101 96* 99*  CO2 26 25 26   GLUCOSE 87 89 113*  BUN 22* 21* 17  CREATININE 1.28* 1.48* 1.62*  CALCIUM 9.5 9.5 9.6  GFRNONAA 37* 31* 28*  GFRAA 43* 36* 32*  PROT 7.5 7.5 7.5  ALBUMIN 4.0 4.1 4.0  AST 22 31 28   ALT 10* 15 13*  ALKPHOS 86 89 81  BILITOT 0.6 0.5 0.7    RADIOGRAPHIC STUDIES: I have personally reviewed the radiological images as listed and agreed with the findings in the report. Dg Chest 2 View  Result Date: 02/25/2017 CLINICAL DATA:  Shortness of breath.  History of lung cancer. EXAM: CHEST  2 VIEW COMPARISON:  Multiple prior chest x-rays and chest CTs. FINDINGS: Borderline cardiac enlargement is stable. Dense left upper lobe scarring changes and persistent left lower lobe opacity consistent with loculated pleural fluid. Persistent right upper lobe and right mid lung lesions. Stable underlying emphysema. IMPRESSION: Persistent chronic lung changes as discussed above. No acute superimposed pulmonary process. Electronically Signed   By: Marijo Sanes M.D.   On: 02/25/2017 13:21   Ct Chest Wo Contrast  Result Date: 01/31/2017 CLINICAL DATA:  Followup bilateral lung carcinoma. Left upper lobe squamous cell carcinoma undergoing immunotherapy.  Previous radiation therapy. EXAM: CT CHEST WITHOUT CONTRAST TECHNIQUE: Multidetector CT imaging of the chest was performed following the standard protocol without IV contrast. COMPARISON:  11/05/2016 FINDINGS: Cardiovascular: No acute findings. Aortic and coronary artery atherosclerosis. No evidence of pericardial effusion. Mediastinum/Nodes: New mediastinal lymphadenopathy is seen in the right paratracheal region measuring 1.3 cm on image 48/2, and in the subcarinal region measuring 2.3 cm on image 59/2. Lungs/Pleura: 4.0 x 3.6 cm mass in the central left upper lobe and mediastinum on image 56/2 is mildly increased in size compared to 4.0 x 2.6 cm previously. There is now complete obstruction of the central left upper lobe bronchus and complete left upper lobe collapse. Mild increase in opacity in the left perihilar region with associated bronchiectasis, likely due to previous radiation therapy. Previously seen left lower lobe airspace disease has resolved, and small left pleural effusion is decreased in size. A 6 mm pulmonary nodule is seen in the posterior left lower lobe on image 32/3 which is not been seen on previous studies. Spiculated nodule in posterior right upper lobe measuring 2.5 x 1.6 cm on image 30/3 shows no significant change compared to prior study. A spiculated nodule in the superior right lower lobe with central cavitation on image 71/3 measures 2.0 x 1.4 cm, compared to 2.1 x 1.2 cm previously. New 5 mm pulmonary nodule in the posterior right lower lobe on image 60/3. Mild emphysema again noted. Upper Abdomen:  Unremarkable. Musculoskeletal:  No suspicious bone lesions. IMPRESSION: New mild right paratracheal and subcarinal mediastinal lymphadenopathy. Mild increase in size of central left upper lobe and mediastinal mass, with complete collapse of left upper lobe. New sub-cm bilateral lower lobe pulmonary nodules. No significant change in spiculated nodules in the right upper and lower lobes.  Interval decrease in left lower lobe airspace disease and small left pleural effusion. Electronically Signed   By: Earle Gell M.D.   On: 01/31/2017 15:57    ASSESSMENT & PLAN:   Primary cancer of left upper lobe of lung ([place])  # Squamous cell carcinoma of the left upper lobe with atelectasis left upper lung; and also contralateral lung nodules- stage IV lung cancer. On palliative  Beryle Flock. CT scan April 16th 2018- CT shows slight increase in the left LUL central mass/ ~4.5cm; chronic left upper lobe collapse. Also shows a tiny 5 mm new lung Nodule.  # Hold Keytruda No. 8 today [C acute issues-diarrhea/skin rash]. Labs otherwise within normal limits except for mildly elevated creatinine.  # Diarrhea- upto 5 loose stools/ poor apetite- question infectious versus inflammatory from [facility]. Start prednisone tapering dose over 10 days. Check stool GI PCR  # Skin- rash- related to Keytruda- prednisone as above.  # SOB/COPD/ Lung ca/chronic - Still on 4L continuous oxygen therapy. pulx on today 96%; stable. However given the increasing cough recommend chest x-ray. Patient had previous pleural effusions.  # chronic Afib on eliquis/stable.  # CKD- stage III- creatinine slightly elevated 1.6 recommend increased fluid intake. Question prerenal.  # Follow-up in 2 weeks cycle # of Keytruda/labs Manufacturing engineer- financial diffulcites].       Cammie Sickle, MD 02/25/2017 6:23 PM

## 2017-02-25 NOTE — Progress Notes (Signed)
Patient here today for follow up.   

## 2017-02-25 NOTE — Assessment & Plan Note (Addendum)
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 On palliative [place]. CT scan April 16th 2018- CT shows slight increase in the left LUL central mass/ ~4.5cm; chronic left upper lobe collapse. Also shows a tiny 5 mm new lung Nodule.  # Hold Keytruda No. 8 today [C acute issues-diarrhea/skin rash]. Labs otherwise within normal limits except for mildly elevated creatinine.  # Diarrhea- upto 5 loose stools/ poor apetite- question infectious versus inflammatory from [facility]. Start prednisone tapering dose over 10 days. Check stool GI PCR  # Skin- rash- related to Keytruda- prednisone as above.  # SOB/COPD/ Lung ca/chronic - Still on 4L continuous oxygen therapy. pulx on today 96%; stable. However given the increasing cough recommend chest x-ray. Patient had previous pleural effusions.  # chronic Afib on eliquis/stable.  # CKD- stage III- creatinine slightly elevated 1.6 recommend increased fluid intake. Question prerenal.  # Follow-up in 2 weeks cycle # of Keytruda/labs Manufacturing engineer- financial diffulcites].

## 2017-02-28 ENCOUNTER — Encounter: Payer: Self-pay | Admitting: Internal Medicine

## 2017-02-28 NOTE — Progress Notes (Unsigned)
PSN has been helping patient with financial concerns for the past several months.

## 2017-03-05 ENCOUNTER — Other Ambulatory Visit: Payer: Self-pay | Admitting: Cardiovascular Disease

## 2017-03-06 ENCOUNTER — Other Ambulatory Visit: Payer: Self-pay | Admitting: Cardiovascular Disease

## 2017-03-06 ENCOUNTER — Other Ambulatory Visit: Payer: Self-pay | Admitting: Internal Medicine

## 2017-03-07 ENCOUNTER — Other Ambulatory Visit: Payer: Self-pay | Admitting: Family Medicine

## 2017-03-07 ENCOUNTER — Telehealth: Payer: Self-pay | Admitting: Cardiovascular Disease

## 2017-03-07 NOTE — Telephone Encounter (Signed)
Medication Samples have been provided to the patient.  Drug name: Eliquis       Strength: 2.'5mg'$         Qty: 4 boxes  LOT: [id number]  Exp.Date: 6/19  Dosing instructions: 1 tablet twice daily  Pt notified that samples are at the front desk. She will pick up Friday, May 25.

## 2017-03-11 ENCOUNTER — Telehealth: Payer: Self-pay | Admitting: Internal Medicine

## 2017-03-11 ENCOUNTER — Encounter: Payer: Self-pay | Admitting: Internal Medicine

## 2017-03-11 ENCOUNTER — Inpatient Hospital Stay: Payer: Medicare Other

## 2017-03-11 ENCOUNTER — Inpatient Hospital Stay (HOSPITAL_BASED_OUTPATIENT_CLINIC_OR_DEPARTMENT_OTHER): Payer: Medicare Other | Admitting: Internal Medicine

## 2017-03-11 VITALS — BP 119/84 | HR 117 | Temp 98.2°F | Wt 134.8 lb

## 2017-03-11 DIAGNOSIS — R21 Rash and other nonspecific skin eruption: Secondary | ICD-10-CM

## 2017-03-11 DIAGNOSIS — Z923 Personal history of irradiation: Secondary | ICD-10-CM

## 2017-03-11 DIAGNOSIS — R531 Weakness: Secondary | ICD-10-CM | POA: Diagnosis not present

## 2017-03-11 DIAGNOSIS — C3412 Malignant neoplasm of upper lobe, left bronchus or lung: Secondary | ICD-10-CM | POA: Diagnosis not present

## 2017-03-11 DIAGNOSIS — D510 Vitamin B12 deficiency anemia due to intrinsic factor deficiency: Secondary | ICD-10-CM

## 2017-03-11 DIAGNOSIS — R262 Difficulty in walking, not elsewhere classified: Secondary | ICD-10-CM

## 2017-03-11 DIAGNOSIS — Z9981 Dependence on supplemental oxygen: Secondary | ICD-10-CM | POA: Diagnosis not present

## 2017-03-11 DIAGNOSIS — I129 Hypertensive chronic kidney disease with stage 1 through stage 4 chronic kidney disease, or unspecified chronic kidney disease: Secondary | ICD-10-CM

## 2017-03-11 DIAGNOSIS — Z880 Allergy status to penicillin: Secondary | ICD-10-CM

## 2017-03-11 DIAGNOSIS — R110 Nausea: Secondary | ICD-10-CM

## 2017-03-11 DIAGNOSIS — J9811 Atelectasis: Secondary | ICD-10-CM

## 2017-03-11 DIAGNOSIS — Z9225 Personal history of immunosupression therapy: Secondary | ICD-10-CM | POA: Diagnosis not present

## 2017-03-11 DIAGNOSIS — R197 Diarrhea, unspecified: Secondary | ICD-10-CM | POA: Diagnosis not present

## 2017-03-11 DIAGNOSIS — E039 Hypothyroidism, unspecified: Secondary | ICD-10-CM | POA: Diagnosis not present

## 2017-03-11 DIAGNOSIS — N183 Chronic kidney disease, stage 3 (moderate): Secondary | ICD-10-CM | POA: Diagnosis not present

## 2017-03-11 DIAGNOSIS — E7800 Pure hypercholesterolemia, unspecified: Secondary | ICD-10-CM

## 2017-03-11 DIAGNOSIS — K219 Gastro-esophageal reflux disease without esophagitis: Secondary | ICD-10-CM

## 2017-03-11 DIAGNOSIS — Z87891 Personal history of nicotine dependence: Secondary | ICD-10-CM | POA: Diagnosis not present

## 2017-03-11 DIAGNOSIS — E559 Vitamin D deficiency, unspecified: Secondary | ICD-10-CM | POA: Diagnosis not present

## 2017-03-11 DIAGNOSIS — Z7901 Long term (current) use of anticoagulants: Secondary | ICD-10-CM

## 2017-03-11 DIAGNOSIS — I482 Chronic atrial fibrillation: Secondary | ICD-10-CM

## 2017-03-11 DIAGNOSIS — R0609 Other forms of dyspnea: Secondary | ICD-10-CM | POA: Diagnosis not present

## 2017-03-11 DIAGNOSIS — R2681 Unsteadiness on feet: Secondary | ICD-10-CM | POA: Insufficient documentation

## 2017-03-11 DIAGNOSIS — J90 Pleural effusion, not elsewhere classified: Secondary | ICD-10-CM | POA: Diagnosis not present

## 2017-03-11 DIAGNOSIS — Z79899 Other long term (current) drug therapy: Secondary | ICD-10-CM

## 2017-03-11 DIAGNOSIS — M1990 Unspecified osteoarthritis, unspecified site: Secondary | ICD-10-CM

## 2017-03-11 DIAGNOSIS — C771 Secondary and unspecified malignant neoplasm of intrathoracic lymph nodes: Secondary | ICD-10-CM | POA: Diagnosis not present

## 2017-03-11 DIAGNOSIS — J449 Chronic obstructive pulmonary disease, unspecified: Secondary | ICD-10-CM | POA: Diagnosis not present

## 2017-03-11 DIAGNOSIS — Z5112 Encounter for antineoplastic immunotherapy: Secondary | ICD-10-CM | POA: Diagnosis not present

## 2017-03-11 LAB — CBC WITH DIFFERENTIAL/PLATELET
BASOS ABS: 0.1 10*3/uL (ref 0–0.1)
BASOS PCT: 1 %
Eosinophils Absolute: 0 10*3/uL (ref 0–0.7)
Eosinophils Relative: 0 %
HEMATOCRIT: 37.7 % (ref 35.0–47.0)
HEMOGLOBIN: 12.7 g/dL (ref 12.0–16.0)
LYMPHS PCT: 3 %
Lymphs Abs: 0.4 10*3/uL — ABNORMAL LOW (ref 1.0–3.6)
MCH: 28.4 pg (ref 26.0–34.0)
MCHC: 33.7 g/dL (ref 32.0–36.0)
MCV: 84.2 fL (ref 80.0–100.0)
Monocytes Absolute: 0.4 10*3/uL (ref 0.2–0.9)
Monocytes Relative: 4 %
NEUTROS ABS: 11.1 10*3/uL — AB (ref 1.4–6.5)
NEUTROS PCT: 92 %
Platelets: 225 10*3/uL (ref 150–440)
RBC: 4.48 MIL/uL (ref 3.80–5.20)
RDW: 18.2 % — ABNORMAL HIGH (ref 11.5–14.5)
WBC: 12 10*3/uL — ABNORMAL HIGH (ref 3.6–11.0)

## 2017-03-11 LAB — COMPREHENSIVE METABOLIC PANEL
ALBUMIN: 4.1 g/dL (ref 3.5–5.0)
ALK PHOS: 75 U/L (ref 38–126)
ALT: 37 U/L (ref 14–54)
AST: 43 U/L — AB (ref 15–41)
Anion gap: 6 (ref 5–15)
BILIRUBIN TOTAL: 0.6 mg/dL (ref 0.3–1.2)
BUN: 31 mg/dL — AB (ref 6–20)
CALCIUM: 9.5 mg/dL (ref 8.9–10.3)
CO2: 29 mmol/L (ref 22–32)
CREATININE: 1.74 mg/dL — AB (ref 0.44–1.00)
Chloride: 94 mmol/L — ABNORMAL LOW (ref 101–111)
GFR calc Af Amer: 30 mL/min — ABNORMAL LOW (ref >60)
GFR calc non Af Amer: 26 mL/min — ABNORMAL LOW (ref >60)
GLUCOSE: 122 mg/dL — AB (ref 65–99)
Potassium: 5 mmol/L (ref 3.5–5.1)
Sodium: 129 mmol/L — ABNORMAL LOW (ref 135–145)
TOTAL PROTEIN: 7.4 g/dL (ref 6.5–8.1)

## 2017-03-11 LAB — TSH: TSH: 93.215 u[IU]/mL — ABNORMAL HIGH (ref 0.350–4.500)

## 2017-03-11 MED ORDER — PEMBROLIZUMAB CHEMO INJECTION 100 MG/4ML
200.0000 mg | Freq: Once | INTRAVENOUS | Status: AC
  Administered 2017-03-11: 200 mg via INTRAVENOUS
  Filled 2017-03-11: qty 8

## 2017-03-11 MED ORDER — SODIUM CHLORIDE 0.9 % IV SOLN
Freq: Once | INTRAVENOUS | Status: AC
  Administered 2017-03-11: 15:00:00 via INTRAVENOUS
  Filled 2017-03-11: qty 1000

## 2017-03-11 MED ORDER — LEVOTHYROXINE SODIUM 88 MCG PO TABS: 88.0000 ug | ORAL_TABLET | Freq: Every day | ORAL | 4 refills | Status: AC

## 2017-03-11 NOTE — Telephone Encounter (Signed)
Spoke to pt/family re: TSH; sent synthroid to pharmacy.

## 2017-03-11 NOTE — Progress Notes (Signed)
Patient c/o bilateral joint pain of the hips and knees when lying down.  Patient states she's experiencing light headed or a fuzzy/dizzy feeling in her head lately. Patient also states she's noticed a bright light in her left eye that moves to her right eye.  Her Daughter states she noticed a difference in her mother's coordination lately.

## 2017-03-11 NOTE — Assessment & Plan Note (Addendum)
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 On palliative [place]. CT scan April 16th 2018- CT shows slight increase in the left LUL central mass/ ~4.5cm; chronic left upper lobe collapse. Also shows a tiny 5 mm new lung Nodule.  # Proceed with Keytruda No. 8 today. Labs otherwise within normal limits except for mildly elevated creatinine [1.7]. TSH today pending.   # Diarrhea/skin rash- question related to [facility]. Status post prednisone-currently resolved.  # Difficulty with gait- question metastases to the brain. Recommend MRI of the brain ASAP.  # Chronic COPD/pleural effusion status post thoracentesis- currently on oxygen. Stable.  # chronic Afib on eliquis/stable.  # CKD- stage III- creatinine slightly elevated 1.76 recommend increased fluid intake. Question prerenal.  # Follow up with X-MD in 3 weeks; infusion; followed up in 6 weeks/infusion. Labs prior. We will also make a palliative care referral.  Addendum: TSH elevated at 93; started the patient on 88 g of Synthroid. Spoke to the patient/daughters. We will recheck thyroid profile at next visit. Thyroid supplementation will need to be adjusted.

## 2017-03-11 NOTE — Progress Notes (Signed)
[place] NOTE  Patient Care Team: Einar Pheasant, MD as PCP - General (Internal Medicine) Wellington Hampshire, MD as Consulting Physician (Cardiology)  CHIEF COMPLAINTS/PURPOSE OF CONSULTATION: Lung cancer  #  Oncology History   # SEP 2017- SQUAMOUS CELL CA [s/p Bronch Dr.S Khan];PET - LEFT UPPER LOBE MASS- left upper lung collpase; mediastinal LN; Contralateral Right nodules [x3];STAGE IV [contralateral lung nodules]  # PDL-1- 90% [Keytruda]  # Hx of smoking/ COPD/ CKD [creat ~ 1.5]     Primary cancer of left upper lobe of lung ([place])   07/06/2016 Initial Diagnosis    Primary cancer of left upper lobe of lung ([facility])       HISTORY OF PRESENTING ILLNESS:  Alice Delgado 83 y.o.  female with newly diagnosed squamous cell lung cancer stage IV; currently on palliative Beryle Flock is here for a follow up.   Patient's treatment was held approximately 2 weeks ago because of- diarrhea/over all feeling poorly/skin rash. Patient was treated with prednisone. Skin rash has resolved. Diarrhea resolved.  However as per family patient continues to feel weak; has difficulty getting up and moving around. Patient has been having difficulty walking. She goes around in the house by herself. Reluctant to use any assistive devices. Patient continues to be on 3.5-4 L of oxygen.  Patient is interested in continuing the treatments with Keytruda. Her appetite is fair. No weight loss. No bleeding episodes of falls.  ROS: A complete 10 point review of system is done which is negative except mentioned above in history of present illness  MEDICAL HISTORY:  Past Medical History:  Diagnosis Date  . Asthma   . Diverticulosis   . GERD (gastroesophageal reflux disease)   . Hiatal hernia   . Hypercholesterolemia   . Hyperglycemia   . Hypertension   . Hypothyroidism   . Osteoarthritis    lumbar disc dz, cervical disc dz, hands  . Osteoporosis    intolerance to fosamax, evista and  miacalcin  . Peptic ulcer disease   . Pernicious anemia   . Vitamin D deficiency     SURGICAL HISTORY: Past Surgical History:  Procedure Laterality Date  . ABDOMINAL HYSTERECTOMY     with benign ovarian tumor removed.    Marland Kitchen FLEXIBLE BRONCHOSCOPY N/A 06/16/2016   Procedure: FLEXIBLE BRONCHOSCOPY;  Surgeon: Allyne Gee, MD;  Location: [facility] ORS;  Service: Pulmonary;  Laterality: N/A;  . 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 No alcohol. Social History   Social History  . Marital status: Widowed    Spouse name: N/A  . Number of children: 2  . Years of education: N/A   Occupational History  . Not on file.   Social History Main Topics  . Smoking status: Former Smoker    Years: 15.00    Types: Cigarettes  . Smokeless tobacco: Never Used  . Alcohol use No  . Drug use: No  . Sexual activity: Not on file   Other Topics Concern  . Not on file   Social History Narrative  . No narrative on file    FAMILY HISTORY: Family History  Problem Relation Age of Onset  . Heart disease Father        myocardial infarction  . Heart attack Father   . Cancer Mother        cancer of lymph nodes and kidney  . Breast cancer Sister   . Colon cancer  Neg Hx     ALLERGIES:  is allergic to penicillins; augmentin [amoxicillin-pot clavulanate]; evista [raloxifene]; fosamax [alendronate sodium]; lipitor [atorvastatin]; miralax [polyethylene glycol]; and zetia [ezetimibe].  MEDICATIONS:  Current Outpatient Prescriptions  Medication Sig Dispense Refill  . albuterol (PROAIR HFA) 108 (90 BASE) MCG/ACT inhaler Inhale 1 puff into the lungs every 6 (six) hours as needed for wheezing or shortness of breath.    Marland Kitchen albuterol (PROVENTIL) (2.5 MG/3ML) 0.083% nebulizer solution Take 3 mLs (2.5 mg total) by nebulization 2 (two) times daily. DX: EMPHYSEMA DX CODE: J43.8 75 mL 12  . BREO ELLIPTA 100-25 MCG/INH AEPB INHALE 1 PUFF BY MOUTH DAILY  30 each 4  . cyanocobalamin (,VITAMIN B-12,) 1000 MCG/ML injection Inject 1 mL (1,000 mcg total) into the muscle every 30 (thirty) days. 10 mL 1  . ELIQUIS 2.5 MG TABS tablet TAKE 1 TABLET(2.5 MG) BY MOUTH TWICE DAILY 60 tablet 3  . HYDROcodone-acetaminophen (VICODIN) 5-325 mg TABS tablet One pill every night as needed. 60 tablet 0  . hydrOXYzine (ATARAX/VISTARIL) 25 MG tablet Take 1 tablet (25 mg total) by mouth 3 (three) times daily as needed for itching. 60 tablet 1  . ipratropium-albuterol (DUONEB) 0.5-2.5 (3) MG/3ML SOLN Take 3 mLs by nebulization as needed.    . metoprolol tartrate (LOPRESSOR) 50 MG tablet TAKE 1 TABLET(50 MG) BY MOUTH TWICE DAILY 60 tablet 3  . montelukast (SINGULAIR) 10 MG tablet TAKE 1 TABLET(10 MG) BY MOUTH AT BEDTIME 90 tablet 0  . nitroGLYCERIN (NITROSTAT) 0.4 MG SL tablet Place 1 tablet (0.4 mg total) under the tongue every 5 (five) minutes as needed for chest pain. 25 tablet 2  . ondansetron (ZOFRAN) 4 MG tablet TK 1 T PO BID PRF NAUSEA  1  . spironolactone (ALDACTONE) 25 MG tablet TAKE 1 TABLET BY MOUTH DAILY 30 tablet 3  . levothyroxine (SYNTHROID, LEVOTHROID) 88 MCG tablet Take 1 tablet (88 mcg total) by mouth daily before breakfast. 30 tablet 4  . predniSONE (DELTASONE) 20 MG tablet 2 pills a day x 1 week; and then 1 pill once a day x 1 week; Take in AM/ with food. (Patient not taking: Reported on 03/11/2017) 21 tablet 0  . triamcinolone ointment (KENALOG) 0.5 % Apply 1 application topically 2 (two) times daily. (Patient not taking: Reported on 03/11/2017) 30 g 0   No current facility-administered medications for this visit.       Marland Kitchen  PHYSICAL EXAMINATION: ECOG PERFORMANCE STATUS: 1 - Symptomatic but completely ambulatory  Vitals:   03/11/17 1356  BP: 119/84  Pulse: (!) 117  Temp: 98.2 F (36.8 C)   Filed Weights   03/11/17 1356  Weight: 134 lb 12.8 oz (61.1 kg)    GENERAL: Well-nourished well-developed; Alert, no distress and comfortable.  With  daughters.She is in a wheelchair. She is on 4 L of oxygen. EYES: no pallor or icterus OROPHARYNX: no thrush or ulceration; good dentition  NECK: supple, no masses felt LYMPH:  no palpable lymphadenopathy in the cervical, axillary or inguinal regions LUNGS: Decreased breath sounds on the left side compared to the right.  HEART/CVS: irregular rhythm and no murmurs; No lower extremity edema ABDOMEN: abdomen soft, non-tender and normal bowel sounds Musculoskeletal:no cyanosis of digits and no clubbing  PSYCH: alert & oriented x 3 with fluent speech NEURO: no focal motor/sensory deficits Skin- multiple ecchymosis. Otherwise no rash.  LABORATORY DATA:  I have reviewed the data as listed Lab Results  Component Value Date   WBC 12.0 (H) 03/11/2017  HGB 12.7 03/11/2017   HCT 37.7 03/11/2017   MCV 84.2 03/11/2017   PLT 225 03/11/2017    Recent Labs  02/04/17 1100 02/25/17 1045 03/11/17 1255  NA 129* 133* 129*  K 4.8 4.3 5.0  CL 96* 99* 94*  CO2 25 26 29   GLUCOSE 89 113* 122*  BUN 21* 17 31*  CREATININE 1.48* 1.62* 1.74*  CALCIUM 9.5 9.6 9.5  GFRNONAA 31* 28* 26*  GFRAA 36* 32* 30*  PROT 7.5 7.5 7.4  ALBUMIN 4.1 4.0 4.1  AST 31 28 43*  ALT 15 13* 37  ALKPHOS 89 81 75  BILITOT 0.5 0.7 0.6    RADIOGRAPHIC STUDIES: I have personally reviewed the radiological images as listed and agreed with the findings in the report. Dg Chest 2 View  Result Date: 02/25/2017 CLINICAL DATA:  Shortness of breath.  History of lung cancer. EXAM: CHEST  2 VIEW COMPARISON:  Multiple prior chest x-rays and chest CTs. FINDINGS: Borderline cardiac enlargement is stable. Dense left upper lobe scarring changes and persistent left lower lobe opacity consistent with loculated pleural fluid. Persistent right upper lobe and right mid lung lesions. Stable underlying emphysema. IMPRESSION: Persistent chronic lung changes as discussed above. No acute superimposed pulmonary process. Electronically Signed   By: Marijo Sanes M.D.   On: 02/25/2017 13:21    ASSESSMENT & PLAN:   Primary cancer of left upper lobe of lung ([place])  # Squamous cell carcinoma of the left upper lobe with atelectasis left upper lung; and also contralateral lung nodules- stage IV lung cancer. On palliative [place]. CT scan April 16th 2018- CT shows slight increase in the left LUL central mass/ ~4.5cm; chronic left upper lobe collapse. Also shows a tiny 5 mm new lung Nodule.  # Proceed with Keytruda No. 8 today. Labs otherwise within normal limits except for mildly elevated creatinine [1.7]. TSH today pending.   # Diarrhea/skin rash- question related to [facility]. Status post prednisone-currently resolved.  # Difficulty with gait- question metastases to the brain. Recommend MRI of the brain ASAP.  # Chronic COPD/pleural effusion status post thoracentesis- currently on oxygen. Stable.  # chronic Afib on eliquis/stable.  # CKD- stage III- creatinine slightly elevated 1.76 recommend increased fluid intake. Question prerenal.  # Follow up with X-MD in 3 weeks; infusion; followed up in 6 weeks/infusion. Labs prior. We will also make a palliative care referral.  Addendum: TSH elevated at 93; started the patient on 88 g of Synthroid. Spoke to the patient/daughters. We will recheck thyroid profile at next visit. Thyroid supplementation will need to be adjusted.       Alice Sickle, MD 03/12/2017 5:30 PM

## 2017-03-11 NOTE — Progress Notes (Signed)
Creatinine 1.74 today. Per Dr Rogue Bussing okay to proceed with [facility] treatment today

## 2017-03-13 DIAGNOSIS — J449 Chronic obstructive pulmonary disease, unspecified: Secondary | ICD-10-CM | POA: Diagnosis not present

## 2017-03-14 ENCOUNTER — Telehealth: Payer: Self-pay | Admitting: Internal Medicine

## 2017-03-14 DIAGNOSIS — C3412 Malignant neoplasm of upper lobe, left bronchus or lung: Secondary | ICD-10-CM

## 2017-03-14 DIAGNOSIS — R2681 Unsteadiness on feet: Secondary | ICD-10-CM

## 2017-03-14 NOTE — Progress Notes (Unsigned)
x

## 2017-03-14 NOTE — Telephone Encounter (Signed)
Spoke to patient's daughter concerning for stroke/TIA. Patient did not want to be evaluated in emergency room. Recommend to get the MRI as ordered. Currently planned for June 6th. Will try to have it ordered STAT/ especially given the mental status changes. Please expedite the workup recommend going to the emergency room. Again discussed on 528 with the daughter- pt still reluctant.   Please re-schedule to STAT; and inform pt/family.

## 2017-03-15 ENCOUNTER — Other Ambulatory Visit: Payer: Self-pay | Admitting: *Deleted

## 2017-03-15 DIAGNOSIS — E039 Hypothyroidism, unspecified: Secondary | ICD-10-CM

## 2017-03-15 NOTE — Telephone Encounter (Signed)
Appt schedule reflects the changes as requested.

## 2017-03-15 NOTE — Telephone Encounter (Signed)
reviewed chart, this msg was received and reviewed by MD. Recommendations were provided per phone note.

## 2017-03-16 ENCOUNTER — Ambulatory Visit
Admission: RE | Admit: 2017-03-16 | Discharge: 2017-03-16 | Disposition: A | Discharge status: Home / Self Care | Payer: Medicare Other | Source: Ambulatory Visit | Attending: Internal Medicine | Admitting: Internal Medicine

## 2017-03-16 DIAGNOSIS — M4802 Spinal stenosis, cervical region: Secondary | ICD-10-CM | POA: Insufficient documentation

## 2017-03-16 DIAGNOSIS — C3412 Malignant neoplasm of upper lobe, left bronchus or lung: Secondary | ICD-10-CM | POA: Diagnosis not present

## 2017-03-16 DIAGNOSIS — R2681 Unsteadiness on feet: Secondary | ICD-10-CM | POA: Insufficient documentation

## 2017-03-16 DIAGNOSIS — I639 Cerebral infarction, unspecified: Secondary | ICD-10-CM | POA: Insufficient documentation

## 2017-03-16 MED ORDER — GADOBENATE DIMEGLUMINE 529 MG/ML IV SOLN
20.0000 mL | Freq: Once | INTRAVENOUS | Status: AC | PRN
  Administered 2017-03-16: 10 mL via INTRAVENOUS

## 2017-03-17 ENCOUNTER — Ambulatory Visit (INDEPENDENT_AMBULATORY_CARE_PROVIDER_SITE_OTHER): Payer: Medicare Other | Admitting: Pulmonary Disease

## 2017-03-17 ENCOUNTER — Encounter: Payer: Self-pay | Admitting: Pulmonary Disease

## 2017-03-17 VITALS — BP 116/78 | HR 118 | Ht 61.0 in | Wt 131.0 lb

## 2017-03-17 DIAGNOSIS — I482 Chronic atrial fibrillation, unspecified: Secondary | ICD-10-CM

## 2017-03-17 DIAGNOSIS — J449 Chronic obstructive pulmonary disease, unspecified: Secondary | ICD-10-CM

## 2017-03-17 DIAGNOSIS — C3412 Malignant neoplasm of upper lobe, left bronchus or lung: Secondary | ICD-10-CM

## 2017-03-17 DIAGNOSIS — C3490 Malignant neoplasm of unspecified part of unspecified bronchus or lung: Secondary | ICD-10-CM

## 2017-03-17 DIAGNOSIS — J9611 Chronic respiratory failure with hypoxia: Secondary | ICD-10-CM | POA: Diagnosis not present

## 2017-03-17 DIAGNOSIS — J9811 Atelectasis: Secondary | ICD-10-CM

## 2017-03-17 DIAGNOSIS — R0602 Shortness of breath: Secondary | ICD-10-CM

## 2017-03-17 MED ORDER — ALBUTEROL SULFATE HFA 108 (90 BASE) MCG/ACT IN AERS: 2.0000 | INHALATION_SPRAY | Freq: Four times a day (QID) | RESPIRATORY_TRACT | 5 refills | Status: AC | PRN

## 2017-03-17 MED ORDER — BREO ELLIPTA 100-25 MCG/INH IN AEPB: INHALATION_SPRAY | RESPIRATORY_TRACT | 0 refills | Status: AC

## 2017-03-17 MED ORDER — TIOTROPIUM BROMIDE MONOHYDRATE 2.5 MCG/ACT IN AERS: 2.0000 | INHALATION_SPRAY | Freq: Every day | RESPIRATORY_TRACT | 0 refills | Status: AC

## 2017-03-17 NOTE — Patient Instructions (Signed)
Continue oxygen therapy as currently Continue Breo inhaler Add Spiriva Respimat - 2 puffs once a day Continue albuterol inhaler as needed You may try off of Singulair and remain off if you notice no worsening of symptoms Follow-up in 2-3 months

## 2017-03-18 ENCOUNTER — Telehealth: Payer: Self-pay | Admitting: *Deleted

## 2017-03-18 DIAGNOSIS — J449 Chronic obstructive pulmonary disease, unspecified: Secondary | ICD-10-CM | POA: Diagnosis not present

## 2017-03-18 NOTE — Telephone Encounter (Signed)
-----   Message from Cammie Sickle, MD sent at 03/16/2017  4:01 PM EDT ----- Please inform pt/daughter that the MRI did not show any cancer to brain. But shows old clot in right brain. Continue eliquis for now. No new changes.  Possible her symptoms are form low thyroid; and will have to wait for improvement on synthoid which has to be adjusted on her thyroid numbers.

## 2017-03-18 NOTE — Telephone Encounter (Signed)
Spoke with daughter - daughter aware of results and plan of care. Please inform pt/daughter that the MRI did not show any cancer to brain. But shows old clot in right brain. Continue eliquis for now. No new changes. Explained that md states that abn thryoid levels could contribute to patient's symptoms.  Pt currently on synthroid 88 mcg daily. I explained to pt's daughter the md will keep her mother on this same dose for now. Thyroid functions will be r/checked in 2 weeks time. Dr. Janese Banks can make any decision regarding dose changing at that time. Pt's daughter also would like Dr. Janese Banks to sign DNR form at next visit as pt was DNR in the [facility].  Daughter thanked me for calling her with the results.

## 2017-03-21 NOTE — Progress Notes (Signed)
PULMONARY OFFICE FOLLOW UP NOTE  PROBLEMS:  Metastatic NSCCa - on Keytruda LUL atelectasis COPD Chronic hypoxic resp failure - chronic O2 therapy since early 2017 Chronic AF  DATA: I have reviewed prior imaging studies inculding most recent CT chest and CXR  INTERVAL HISTORY: Previously seen by DR  SUBJ: Routine pulmonary re-eval. Plagued with severe dyspnea - class IV. Has progressed gradually over time. Lives at home but requires a lot of assistance from family members. She is on Breo which she believes is beneficial. She uses her rescue MDI 1-2 times per day and her rescue nebs 0-2 times per week.   OBJ: Vitals:   03/17/17 1115 03/17/17 1117  BP:  116/78  Pulse:  (!) 118  SpO2:  100%  Weight:  131 lb (59.4 kg)  Height: 5\' 1"  (1.549 m)   4 LPM [place] (pulse)   Gen: Frail, in WC, NAD at rest HEENT: NCAT, sclerae white, oropharynx normal Neck: NO LAN, no JVD noted Lungs: diffusely diffusely diminished BS, no wheezes Cardiovascular: IRIR, no M noted Abdomen: Soft, NT, +BS Ext: no C/C/E Neuro: PERRL, EOMI, motor/sensory grossly intact Skin: No lesions noted   DATA: BMP Latest Ref Rng & Units 03/11/2017 02/25/2017 02/04/2017  Glucose 65 - 99 mg/dL 122(H) 113(H) 89  BUN 6 - 20 mg/dL 31(H) 17 21(H)  Creatinine 0.44 - 1.00 mg/dL 1.74(H) 1.62(H) 1.48(H)  BUN/Creat Ratio 11 - 26 - - -  Sodium 135 - 145 mmol/L 129(L) 133(L) 129(L)  Potassium 3.5 - 5.1 mmol/L 5.0 4.3 4.8  Chloride 101 - 111 mmol/L 94(L) 99(L) 96(L)  CO2 22 - 32 mmol/L 29 26 25   Calcium 8.9 - 10.3 mg/dL 9.5 9.6 9.5   CBC Latest Ref Rng & Units 03/11/2017 02/25/2017 02/04/2017  WBC 3.6 - 11.0 K/uL 12.0(H) 5.5 7.9  Hemoglobin 12.0 - 16.0 g/dL 12.7 12.7 13.6  Hematocrit 35.0 - 47.0 % 37.7 37.3 40.0  Platelets 150 - 440 K/uL 225 213 230    CXR 02/25/17:  Emphysematous changes, bilateral opacities - NSC from prior  IMPRESSION: Chronic hypoxemic respiratory failure Severe dyspnea - multifactorial COPD with  favorable response to Breo Metastatic lung Ca LUL collapse - recurrent LUL obstruction Chronic atrial fibrillation (HCC)   PLAN: Continue oxygen therapy as currently Continue Breo inhaler Add Spiriva Respimat - 2 puffs once a day Continue albuterol inhaler as needed May try off of Singulair and remain off if she notices no worsening of symptoms Follow-up in 2-3 months   Merton Border, MD PCCM service Mobile [phone] Pager [phone] 03/21/2017 10:29 PM

## 2017-03-23 ENCOUNTER — Ambulatory Visit: Payer: Medicare Other

## 2017-04-01 ENCOUNTER — Telehealth: Payer: Self-pay | Admitting: *Deleted

## 2017-04-01 ENCOUNTER — Inpatient Hospital Stay: Payer: Medicare Other | Attending: Oncology | Admitting: Oncology

## 2017-04-01 ENCOUNTER — Encounter: Payer: Self-pay | Admitting: Oncology

## 2017-04-01 ENCOUNTER — Inpatient Hospital Stay: Payer: Medicare Other

## 2017-04-01 ENCOUNTER — Other Ambulatory Visit: Payer: Self-pay

## 2017-04-01 VITALS — BP 111/73 | HR 92 | Temp 98.4°F | Resp 18 | Wt 127.8 lb

## 2017-04-01 DIAGNOSIS — K219 Gastro-esophageal reflux disease without esophagitis: Secondary | ICD-10-CM | POA: Diagnosis not present

## 2017-04-01 DIAGNOSIS — C3412 Malignant neoplasm of upper lobe, left bronchus or lung: Secondary | ICD-10-CM

## 2017-04-01 DIAGNOSIS — I129 Hypertensive chronic kidney disease with stage 1 through stage 4 chronic kidney disease, or unspecified chronic kidney disease: Secondary | ICD-10-CM

## 2017-04-01 DIAGNOSIS — Z79899 Other long term (current) drug therapy: Secondary | ICD-10-CM | POA: Diagnosis not present

## 2017-04-01 DIAGNOSIS — Z803 Family history of malignant neoplasm of breast: Secondary | ICD-10-CM | POA: Diagnosis not present

## 2017-04-01 DIAGNOSIS — Z5112 Encounter for antineoplastic immunotherapy: Secondary | ICD-10-CM | POA: Insufficient documentation

## 2017-04-01 DIAGNOSIS — R300 Dysuria: Secondary | ICD-10-CM

## 2017-04-01 DIAGNOSIS — E559 Vitamin D deficiency, unspecified: Secondary | ICD-10-CM | POA: Diagnosis not present

## 2017-04-01 DIAGNOSIS — R0602 Shortness of breath: Secondary | ICD-10-CM

## 2017-04-01 DIAGNOSIS — D510 Vitamin B12 deficiency anemia due to intrinsic factor deficiency: Secondary | ICD-10-CM | POA: Diagnosis not present

## 2017-04-01 DIAGNOSIS — Z87891 Personal history of nicotine dependence: Secondary | ICD-10-CM | POA: Diagnosis not present

## 2017-04-01 DIAGNOSIS — C771 Secondary and unspecified malignant neoplasm of intrathoracic lymph nodes: Secondary | ICD-10-CM | POA: Diagnosis not present

## 2017-04-01 DIAGNOSIS — R918 Other nonspecific abnormal finding of lung field: Secondary | ICD-10-CM | POA: Diagnosis not present

## 2017-04-01 DIAGNOSIS — J90 Pleural effusion, not elsewhere classified: Secondary | ICD-10-CM

## 2017-04-01 DIAGNOSIS — R21 Rash and other nonspecific skin eruption: Secondary | ICD-10-CM

## 2017-04-01 DIAGNOSIS — Z7901 Long term (current) use of anticoagulants: Secondary | ICD-10-CM | POA: Diagnosis not present

## 2017-04-01 DIAGNOSIS — M1990 Unspecified osteoarthritis, unspecified site: Secondary | ICD-10-CM | POA: Insufficient documentation

## 2017-04-01 DIAGNOSIS — J449 Chronic obstructive pulmonary disease, unspecified: Secondary | ICD-10-CM | POA: Diagnosis not present

## 2017-04-01 DIAGNOSIS — Z8051 Family history of malignant neoplasm of kidney: Secondary | ICD-10-CM | POA: Diagnosis not present

## 2017-04-01 DIAGNOSIS — E039 Hypothyroidism, unspecified: Secondary | ICD-10-CM | POA: Insufficient documentation

## 2017-04-01 DIAGNOSIS — E7800 Pure hypercholesterolemia, unspecified: Secondary | ICD-10-CM | POA: Diagnosis not present

## 2017-04-01 DIAGNOSIS — D559 Anemia due to enzyme disorder, unspecified: Secondary | ICD-10-CM | POA: Diagnosis not present

## 2017-04-01 DIAGNOSIS — N183 Chronic kidney disease, stage 3 (moderate): Secondary | ICD-10-CM | POA: Diagnosis not present

## 2017-04-01 DIAGNOSIS — M810 Age-related osteoporosis without current pathological fracture: Secondary | ICD-10-CM | POA: Insufficient documentation

## 2017-04-01 DIAGNOSIS — I482 Chronic atrial fibrillation: Secondary | ICD-10-CM

## 2017-04-01 DIAGNOSIS — R079 Chest pain, unspecified: Secondary | ICD-10-CM | POA: Insufficient documentation

## 2017-04-01 DIAGNOSIS — Z808 Family history of malignant neoplasm of other organs or systems: Secondary | ICD-10-CM | POA: Insufficient documentation

## 2017-04-01 DIAGNOSIS — Z880 Allergy status to penicillin: Secondary | ICD-10-CM | POA: Diagnosis not present

## 2017-04-01 LAB — CBC WITH DIFFERENTIAL/PLATELET
BASOS ABS: 0.1 10*3/uL (ref 0–0.1)
Basophils Relative: 1 %
EOS ABS: 0.1 10*3/uL (ref 0–0.7)
EOS PCT: 2 %
HCT: 32 % — ABNORMAL LOW (ref 35.0–47.0)
Hemoglobin: 10.8 g/dL — ABNORMAL LOW (ref 12.0–16.0)
LYMPHS ABS: 0.5 10*3/uL — AB (ref 1.0–3.6)
Lymphocytes Relative: 10 %
MCH: 28.9 pg (ref 26.0–34.0)
MCHC: 33.7 g/dL (ref 32.0–36.0)
MCV: 85.8 fL (ref 80.0–100.0)
Monocytes Absolute: 0.7 10*3/uL (ref 0.2–0.9)
Monocytes Relative: 15 %
Neutro Abs: 3.3 10*3/uL (ref 1.4–6.5)
Neutrophils Relative %: 72 %
PLATELETS: 245 10*3/uL (ref 150–440)
RBC: 3.73 MIL/uL — AB (ref 3.80–5.20)
RDW: 17.7 % — ABNORMAL HIGH (ref 11.5–14.5)
WBC: 4.6 10*3/uL (ref 3.6–11.0)

## 2017-04-01 LAB — COMPREHENSIVE METABOLIC PANEL
ALT: 8 U/L — AB (ref 14–54)
AST: 17 U/L (ref 15–41)
Albumin: 3.8 g/dL (ref 3.5–5.0)
Alkaline Phosphatase: 87 U/L (ref 38–126)
Anion gap: 9 (ref 5–15)
BUN: 19 mg/dL (ref 6–20)
CHLORIDE: 94 mmol/L — AB (ref 101–111)
CO2: 26 mmol/L (ref 22–32)
CREATININE: 1.41 mg/dL — AB (ref 0.44–1.00)
Calcium: 9.2 mg/dL (ref 8.9–10.3)
GFR, EST AFRICAN AMERICAN: 38 mL/min — AB (ref >60)
GFR, EST NON AFRICAN AMERICAN: 33 mL/min — AB (ref >60)
Glucose, Bld: 114 mg/dL — ABNORMAL HIGH (ref 65–99)
POTASSIUM: 4.2 mmol/L (ref 3.5–5.1)
Sodium: 129 mmol/L — ABNORMAL LOW (ref 135–145)
Total Bilirubin: 0.5 mg/dL (ref 0.3–1.2)
Total Protein: 7.2 g/dL (ref 6.5–8.1)

## 2017-04-01 LAB — URINALYSIS, COMPLETE (UACMP) WITH MICROSCOPIC
BILIRUBIN URINE: NEGATIVE
GLUCOSE, UA: NEGATIVE mg/dL
KETONES UR: NEGATIVE mg/dL
NITRITE: NEGATIVE
PH: 5 (ref 5.0–8.0)
PROTEIN: NEGATIVE mg/dL
Specific Gravity, Urine: 1.011 (ref 1.005–1.030)

## 2017-04-01 MED ORDER — PEMBROLIZUMAB CHEMO INJECTION 100 MG/4ML
200.0000 mg | Freq: Once | INTRAVENOUS | Status: AC
  Administered 2017-04-01: 200 mg via INTRAVENOUS
  Filled 2017-04-01: qty 8

## 2017-04-01 MED ORDER — SODIUM CHLORIDE 0.9 % IV SOLN
Freq: Once | INTRAVENOUS | Status: AC
  Administered 2017-04-01: 16:00:00 via INTRAVENOUS
  Filled 2017-04-01: qty 1000

## 2017-04-01 MED ORDER — CIPROFLOXACIN HCL 500 MG PO TABS: 500.0000 mg | ORAL_TABLET | Freq: Two times a day (BID) | ORAL | 0 refills | Status: DC

## 2017-04-01 NOTE — Telephone Encounter (Signed)
-----   Message from Sindy Guadeloupe, MD sent at 04/01/2017  4:00 PM EDT ----- UA positive for UTI. I have prescribed cipro 500 bid for 5 days to her pharmacy. Please let her know

## 2017-04-01 NOTE — Progress Notes (Signed)
UA positive for UTI. I have prescribed cipro 500 bid for 5 days to her pharmacy. Please let her know

## 2017-04-01 NOTE — Telephone Encounter (Signed)
Called the pt's home and left message and then called her daughter and spoke to her to let her know that she had UTI and we called in atb to her pharmacy and please take it with food in her stomach.  She will call her mom and let her know

## 2017-04-01 NOTE — Progress Notes (Signed)
Hematology/Oncology Consult note [facility]  [phone] [phone]  Patient Care Team: Einar Pheasant, MD as PCP - General (Internal Medicine) Wellington Hampshire, MD as Consulting Physician (Cardiology)   Name of the patient: Alice Delgado  [id number]  1929/02/16   Date of visit: 04/01/17  Diagnosis- stage IV SCC lung   Chief complaint/ Reason for visit- on treatment assessment prior to [place]  Heme/Onc history:  Oncology History   # SEP 2017- SQUAMOUS CELL CA [s/p Bronch Dr.S Khan];PET - LEFT UPPER LOBE MASS- left upper lung collpase; mediastinal LN; Contralateral Right nodules [x3];STAGE IV [contralateral lung nodules]  # PDL-1- 90% [Keytruda]  # Hx of smoking/ COPD/ CKD [creat ~ 1.5]     Primary cancer of left upper lobe of lung ([place])   07/06/2016 Initial Diagnosis    Primary cancer of left upper lobe of lung ([place])       Interval history- reports some burning occasionally while passing urine. Intermittent left flank pain. No fever. Also reports skin rash is back on her left hand  ECOG PS- 2 Pain scale- 0  Review of systems- Review of Systems  Constitutional: Negative for chills, fever, malaise/fatigue and weight loss.  HENT: Negative for congestion, ear discharge and nosebleeds.   Eyes: Negative for blurred vision.  Respiratory: Negative for cough, hemoptysis, sputum production, shortness of breath and wheezing.   Cardiovascular: Negative for chest pain, palpitations, orthopnea and claudication.  Gastrointestinal: Negative for abdominal pain, blood in stool, constipation, diarrhea, heartburn, melena, nausea and vomiting.       Left flank pain  Genitourinary: Positive for dysuria. Negative for flank pain, frequency, hematuria and urgency.  Musculoskeletal: Negative for back pain, joint pain and myalgias.  Skin: Negative for rash.  Neurological: Negative for dizziness, tingling, focal weakness, seizures, weakness  and headaches.  Endo/Heme/Allergies: Does not bruise/bleed easily.  Psychiatric/Behavioral: Negative for depression and suicidal ideas. The patient does not have insomnia.       Allergies  Allergen Reactions  . Penicillins Anaphylaxis  . Augmentin [Amoxicillin-Pot Clavulanate]   . Evista [Raloxifene]     Leg discomfort   . Fosamax [Alendronate Sodium]     Gi intolerance   . Lipitor [Atorvastatin]     Bloating and GI upset  . Miralax [Polyethylene Glycol]     Question of hives   . Zetia [Ezetimibe]      Past Medical History:  Diagnosis Date  . Asthma   . Diverticulosis   . GERD (gastroesophageal reflux disease)   . Hiatal hernia   . Hypercholesterolemia   . Hyperglycemia   . Hypertension   . Hypothyroidism   . Osteoarthritis    lumbar disc dz, cervical disc dz, hands  . Osteoporosis    intolerance to fosamax, evista and miacalcin  . Peptic ulcer disease   . Pernicious anemia   . Vitamin D deficiency      Past Surgical History:  Procedure Laterality Date  . ABDOMINAL HYSTERECTOMY     with benign ovarian tumor removed.    Marland Kitchen FLEXIBLE BRONCHOSCOPY N/A 06/16/2016   Procedure: FLEXIBLE BRONCHOSCOPY;  Surgeon: Allyne Gee, MD;  Location: [facility] ORS;  Service: Pulmonary;  Laterality: N/A;  . LUMBAR FUSION      Social History   Social History  . Marital status: Widowed    Spouse name: N/A  . Number of children: 2  . Years of education: N/A   Occupational History  . Not on file.   Social  History Main Topics  . Smoking status: Former Smoker    Years: 15.00    Types: Cigarettes  . Smokeless tobacco: Never Used  . Alcohol use No  . Drug use: No  . Sexual activity: Not on file   Other Topics Concern  . Not on file   Social History Narrative  . No narrative on file    Family History  Problem Relation Age of Onset  . Heart disease Father        myocardial infarction  . Heart attack Father   . Cancer Mother        cancer of lymph nodes and kidney    . Breast cancer Sister   . Colon cancer Neg Hx      Current Outpatient Prescriptions:  .  albuterol (PROAIR HFA) 108 (90 Base) MCG/ACT inhaler, Inhale 2 puffs into the lungs every 6 (six) hours as needed for wheezing or shortness of breath., Disp: 18 g, Rfl: 5 .  BREO ELLIPTA 100-25 MCG/INH AEPB, INHALE 1 PUFF BY MOUTH DAILY, Disp: 30 each, Rfl: 0 .  cyanocobalamin (,VITAMIN B-12,) 1000 MCG/ML injection, Inject 1 mL (1,000 mcg total) into the muscle every 30 (thirty) days., Disp: 10 mL, Rfl: 1 .  ELIQUIS 2.5 MG TABS tablet, TAKE 1 TABLET(2.5 MG) BY MOUTH TWICE DAILY, Disp: 60 tablet, Rfl: 3 .  HYDROcodone-acetaminophen (VICODIN) 5-325 mg TABS tablet, One pill every night as needed., Disp: 60 tablet, Rfl: 0 .  hydrOXYzine (ATARAX/VISTARIL) 25 MG tablet, Take 1 tablet (25 mg total) by mouth 3 (three) times daily as needed for itching., Disp: 60 tablet, Rfl: 1 .  ipratropium-albuterol (DUONEB) 0.5-2.5 (3) MG/3ML SOLN, Take 3 mLs by nebulization as needed., Disp: , Rfl:  .  levothyroxine (SYNTHROID, LEVOTHROID) 88 MCG tablet, Take 1 tablet (88 mcg total) by mouth daily before breakfast., Disp: 30 tablet, Rfl: 4 .  metoprolol tartrate (LOPRESSOR) 50 MG tablet, TAKE 1 TABLET(50 MG) BY MOUTH TWICE DAILY, Disp: 60 tablet, Rfl: 3 .  montelukast (SINGULAIR) 10 MG tablet, TAKE 1 TABLET(10 MG) BY MOUTH AT BEDTIME, Disp: 90 tablet, Rfl: 0 .  ondansetron (ZOFRAN) 4 MG tablet, TK 1 T PO BID PRF NAUSEA, Disp: , Rfl: 1 .  spironolactone (ALDACTONE) 25 MG tablet, TAKE 1 TABLET BY MOUTH DAILY, Disp: 30 tablet, Rfl: 3 .  Tiotropium Bromide Monohydrate (SPIRIVA RESPIMAT) 2.5 MCG/ACT AERS, Inhale 2 puffs into the lungs daily., Disp: 4 g, Rfl: 0 .  triamcinolone ointment (KENALOG) 0.5 %, Apply 1 application topically 2 (two) times daily., Disp: 30 g, Rfl: 0 .  albuterol (PROVENTIL) (2.5 MG/3ML) 0.083% nebulizer solution, Take 3 mLs (2.5 mg total) by nebulization 2 (two) times daily. DX: EMPHYSEMA DX CODE: J43.8  (Patient not taking: Reported on 04/01/2017), Disp: 75 mL, Rfl: 12 .  nitroGLYCERIN (NITROSTAT) 0.4 MG SL tablet, Place 1 tablet (0.4 mg total) under the tongue every 5 (five) minutes as needed for chest pain. (Patient not taking: Reported on 04/01/2017), Disp: 25 tablet, Rfl: 2 No current facility-administered medications for this visit.   Facility-Administered Medications Ordered in Other Visits:  .  0.9 %  sodium chloride infusion, , Intravenous, Once, Sindy Guadeloupe, MD .  pembrolizumab [facility]) 200 mg in sodium chloride 0.9 % 50 mL chemo infusion, 200 mg, Intravenous, Once, Sindy Guadeloupe, MD  Physical exam:  Vitals:   04/01/17 1506  BP: 111/73  Pulse: 92  Resp: 18  Temp: 98.4 F (36.9 C)  TempSrc: Tympanic  Weight: 127 lb  12.8 oz (58 kg)   Physical Exam  Constitutional: She is oriented to person, place, and time.  Elderly frail woman sitting in a wheelchair. On home O2  HENT:  Head: Normocephalic and atraumatic.  Eyes: EOM are normal. Pupils are equal, round, and reactive to light.  Neck: Normal range of motion.  Cardiovascular: Normal rate, regular rhythm and normal heart sounds.   Pulmonary/Chest: Effort normal and breath sounds normal.  Abdominal: Soft. Bowel sounds are normal.  No flank tenderness  Neurological: She is alert and oriented to person, place, and time.  Skin: Skin is warm and dry.  Scattered erythematous macular lesions over left hand and distal forearm     CMP Latest Ref Rng & Units 04/01/2017  Glucose 65 - 99 mg/dL 114(H)  BUN 6 - 20 mg/dL 19  Creatinine 0.44 - 1.00 mg/dL 1.41(H)  Sodium 135 - 145 mmol/L 129(L)  Potassium 3.5 - 5.1 mmol/L 4.2  Chloride 101 - 111 mmol/L 94(L)  CO2 22 - 32 mmol/L 26  Calcium 8.9 - 10.3 mg/dL 9.2  Total Protein 6.5 - 8.1 g/dL 7.2  Total Bilirubin 0.3 - 1.2 mg/dL 0.5  Alkaline Phos 38 - 126 U/L 87  AST 15 - 41 U/L 17  ALT 14 - 54 U/L 8(L)   CBC Latest Ref Rng & Units 04/01/2017  WBC 3.6 - 11.0 K/uL 4.6    Hemoglobin 12.0 - 16.0 g/dL 10.8(L)  Hematocrit 35.0 - 47.0 % 32.0(L)  Platelets 150 - 440 K/uL 245    No images are attached to the encounter.  Mr Jeri Cos Wo Contrast  Result Date: 03/16/2017 CLINICAL DATA:  83 year old female diagnosed with lung cancer in August 2017. Episodes of confusion, abnormal speech. Unsteady gait. EXAM: MRI HEAD WITHOUT AND WITH CONTRAST TECHNIQUE: Multiplanar, multiecho pulse sequences of the brain and surrounding structures were obtained without and with intravenous contrast. CONTRAST:  41m MULTIHANCE GADOBENATE DIMEGLUMINE 529 MG/ML IV SOLN COMPARISON:  Brain MRI 07/20/2016. FINDINGS: Brain: No abnormal intracranial enhancement identified. No midline shift, mass effect, or evidence of intracranial mass lesion. No dural thickening. The possible small abnormal area of diffusion in the left cerebellum seen in October does not persist, and no encephalomalacia is evident. Widely scattered nonenhancing cerebral white matter T2 and FLAIR hyperintensity appears stable since 2017. No cortical encephalomalacia or chronic cerebral blood products are identified. However, a chronic lacunar infarct of the right thalamus has developed. Other deep gray matter nuclei, the brainstem, and cerebellum remain normal for age. No restricted diffusion to suggest acute infarction. No ventriculomegaly, extra-axial collection or acute intracranial hemorrhage. Cervicomedullary junction and pituitary are within normal limits. Vascular: Major intracranial vascular flow voids are preserved. Skull and upper cervical spine: Multilevel cervical spondylolisthesis. Lower cervical disc and endplate degeneration with evidence of degenerative C5-C6 spinal stenosis and spinal cord mass effect (series 2, image 12). Visible bone marrow signal is normal. Sinuses/Orbits: Stable an negative. Other: Visible internal auditory structures appear normal. Mastoids remain clear. Negative scalp soft tissues. IMPRESSION: 1.  No  metastatic disease or acute intracranial abnormality. 2. Small right thalamic lacunar infarct is chronic but new since October. 3. Degenerative cervical spinal stenosis with spinal cord mass effect at C5-C6. Electronically Signed   By: HGenevie AnnM.D.   On: 03/16/2017 15:41     Assessment and plan- Patient is a 83y.o. female with Stage IV SCC lung on palliative keytruda  1. Counts ok to proceed [place] rtc in 3 weeks with labs to  see Dr. Rogue Bussing  2. Hypothyroidism- tsh pending today. Continue synthroid  3. Dysuria- UA consistent with UTI. Will prescribe ciprofloxacin 500 mg BID for 5 days.   4. Chronic a fib- on eliquis  5. Stage III CKD- stable  6. Sin rash- hold off on oral steroids. She has topical steroids which she can try a this time   Visit Diagnosis 1. Dysuria   2. Primary cancer of left upper lobe of lung ([place])      Dr. Randa Evens, MD, MPH [facility] Pager- [phone] 04/01/2017 3:36 PM

## 2017-04-01 NOTE — Progress Notes (Signed)
Here for follow up. C/o L sided back pain. Pt question kidney issues. Stated slight burning and frequency of urination for approx 2 weeks.   Urinalysis done today per pt in the lab

## 2017-04-03 LAB — URINE CULTURE

## 2017-04-04 ENCOUNTER — Telehealth: Payer: Self-pay | Admitting: Internal Medicine

## 2017-04-04 NOTE — Telephone Encounter (Signed)
Pt's daughter, Thayer Headings called. She is requesting a refill on; Nystatin cream, 30g and nyamyc powder, 60g. Pharmacy is [facility] in [place]. Daughter's phone is [phone].

## 2017-04-04 NOTE — Telephone Encounter (Signed)
Called and spoke to daughter she has not received from you after 2010. It is secondary from cancer treatment. She was informed that you are out of the office and she may get a faster response from them. She will call them.

## 2017-04-05 NOTE — Telephone Encounter (Signed)
Spoke to daughter she received meds from oncology yesterday

## 2017-04-05 NOTE — Telephone Encounter (Signed)
Let me know if she still needs the prescriptions.

## 2017-04-05 NOTE — Telephone Encounter (Signed)
Called daughter l/m to call back

## 2017-04-07 ENCOUNTER — Telehealth: Payer: Self-pay | Admitting: *Deleted

## 2017-04-07 NOTE — Telephone Encounter (Signed)
Has pain at her sternum through to her back and shortness of breath. I advised she go to ER for evaluation and if she refuses to go there, she should call Dr Nicki Reaper to see if she can see her with her h/o A Fib. She states her mother is stubborn and will only see Dr Rogue Bussing and maybe Dr Nicki Reaper. I advised at the least to call Dr Nicki Reaper for advice

## 2017-04-11 ENCOUNTER — Inpatient Hospital Stay (HOSPITAL_BASED_OUTPATIENT_CLINIC_OR_DEPARTMENT_OTHER): Payer: Medicare Other | Admitting: Internal Medicine

## 2017-04-11 ENCOUNTER — Encounter: Payer: Self-pay | Admitting: Internal Medicine

## 2017-04-11 ENCOUNTER — Telehealth: Payer: Self-pay | Admitting: *Deleted

## 2017-04-11 ENCOUNTER — Other Ambulatory Visit: Payer: Self-pay | Admitting: *Deleted

## 2017-04-11 ENCOUNTER — Other Ambulatory Visit: Payer: Self-pay | Admitting: Cardiovascular Disease

## 2017-04-11 ENCOUNTER — Ambulatory Visit
Admission: RE | Admit: 2017-04-11 | Discharge: 2017-04-11 | Disposition: A | Discharge status: Home / Self Care | Payer: Medicare Other | Source: Ambulatory Visit | Attending: Internal Medicine | Admitting: Internal Medicine

## 2017-04-11 VITALS — BP 107/75 | HR 122 | Temp 98.8°F | Resp 18 | Ht 61.0 in | Wt 127.9 lb

## 2017-04-11 DIAGNOSIS — Z8051 Family history of malignant neoplasm of kidney: Secondary | ICD-10-CM

## 2017-04-11 DIAGNOSIS — C3412 Malignant neoplasm of upper lobe, left bronchus or lung: Secondary | ICD-10-CM | POA: Insufficient documentation

## 2017-04-11 DIAGNOSIS — Z7901 Long term (current) use of anticoagulants: Secondary | ICD-10-CM

## 2017-04-11 DIAGNOSIS — J942 Hemothorax: Secondary | ICD-10-CM

## 2017-04-11 DIAGNOSIS — R918 Other nonspecific abnormal finding of lung field: Secondary | ICD-10-CM | POA: Diagnosis not present

## 2017-04-11 DIAGNOSIS — E039 Hypothyroidism, unspecified: Secondary | ICD-10-CM | POA: Diagnosis not present

## 2017-04-11 DIAGNOSIS — M1990 Unspecified osteoarthritis, unspecified site: Secondary | ICD-10-CM | POA: Diagnosis not present

## 2017-04-11 DIAGNOSIS — Z87891 Personal history of nicotine dependence: Secondary | ICD-10-CM | POA: Diagnosis not present

## 2017-04-11 DIAGNOSIS — Z880 Allergy status to penicillin: Secondary | ICD-10-CM

## 2017-04-11 DIAGNOSIS — Z79899 Other long term (current) drug therapy: Secondary | ICD-10-CM

## 2017-04-11 DIAGNOSIS — Z808 Family history of malignant neoplasm of other organs or systems: Secondary | ICD-10-CM

## 2017-04-11 DIAGNOSIS — R079 Chest pain, unspecified: Secondary | ICD-10-CM

## 2017-04-11 DIAGNOSIS — D510 Vitamin B12 deficiency anemia due to intrinsic factor deficiency: Secondary | ICD-10-CM | POA: Diagnosis not present

## 2017-04-11 DIAGNOSIS — R21 Rash and other nonspecific skin eruption: Secondary | ICD-10-CM | POA: Diagnosis not present

## 2017-04-11 DIAGNOSIS — N183 Chronic kidney disease, stage 3 (moderate): Secondary | ICD-10-CM

## 2017-04-11 DIAGNOSIS — K219 Gastro-esophageal reflux disease without esophagitis: Secondary | ICD-10-CM | POA: Diagnosis not present

## 2017-04-11 DIAGNOSIS — Z5112 Encounter for antineoplastic immunotherapy: Secondary | ICD-10-CM | POA: Diagnosis not present

## 2017-04-11 DIAGNOSIS — E7800 Pure hypercholesterolemia, unspecified: Secondary | ICD-10-CM

## 2017-04-11 DIAGNOSIS — Z803 Family history of malignant neoplasm of breast: Secondary | ICD-10-CM | POA: Diagnosis not present

## 2017-04-11 DIAGNOSIS — E559 Vitamin D deficiency, unspecified: Secondary | ICD-10-CM | POA: Diagnosis not present

## 2017-04-11 DIAGNOSIS — R300 Dysuria: Secondary | ICD-10-CM

## 2017-04-11 DIAGNOSIS — M810 Age-related osteoporosis without current pathological fracture: Secondary | ICD-10-CM

## 2017-04-11 DIAGNOSIS — C771 Secondary and unspecified malignant neoplasm of intrathoracic lymph nodes: Secondary | ICD-10-CM

## 2017-04-11 DIAGNOSIS — J90 Pleural effusion, not elsewhere classified: Secondary | ICD-10-CM | POA: Diagnosis not present

## 2017-04-11 DIAGNOSIS — I482 Chronic atrial fibrillation: Secondary | ICD-10-CM | POA: Diagnosis not present

## 2017-04-11 DIAGNOSIS — J449 Chronic obstructive pulmonary disease, unspecified: Secondary | ICD-10-CM | POA: Diagnosis not present

## 2017-04-11 DIAGNOSIS — I129 Hypertensive chronic kidney disease with stage 1 through stage 4 chronic kidney disease, or unspecified chronic kidney disease: Secondary | ICD-10-CM | POA: Diagnosis not present

## 2017-04-11 MED ORDER — HYDROCODONE-ACETAMINOPHEN 5-325MG PREPACK (~~LOC~~: ORAL_TABLET | ORAL | 0 refills | Status: AC

## 2017-04-11 MED ORDER — APIXABAN 5 MG PO TABS: 5.0000 mg | ORAL_TABLET | Freq: Two times a day (BID) | ORAL | 5 refills | Status: AC

## 2017-04-11 MED ORDER — PREDNISONE 20 MG PO TABS: 20.0000 mg | ORAL_TABLET | Freq: Every day | ORAL | 0 refills | Status: DC

## 2017-04-11 NOTE — Progress Notes (Signed)
Pt presents to clinic for acute add - on for evaluation of sternal pain. She states her Pain is located under left breast that radiates to ribcage and leg axillary region to her left upper back/upper shoulder. Rates pain 5/10. Hydrocodone 5/325 - only taking half tablet twice daily instead of 1 whole tablet at bedtime. Pt takes hydroxyzine to "aid her sleep pattern as this drug makes me drowsy. I know I I'm not supposed to take this drug but for itching but it helps me relax."

## 2017-04-11 NOTE — Assessment & Plan Note (Addendum)
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 On palliative [place]. CT scan April 16th 2018- CT shows slight increase in the left LUL central mass/ ~4.5cm; chronic left upper lobe collapse. Also shows a tiny 5 mm new lung Nodule.    # currently status post 10 cycles of Keytruda; last approximately 10 days ago. Concern for clinical progression- C discussion below.   # left chest wall pain- unclear etiology. Chest x-ray stat. Also recommend increasing the dose of Eliquis to 5 twice a day- as at high risk for PE [unable to get a protocol CT given her renal failure]. Question pleurisy- recommend prednisone 10 mg once a day.   # skin rash- grade 1 related to Keytruda. Recommend prednisone 20 mg a day for 10 days.  # hypothyroidism- May 2018 TSH 70s; currently on Synthroid. Repeat thyroid profile at next visit  # addendum- chest x-ray showed whiteout of the left lung- more likely atelectasis given the absence of mediastinal shift to the other side. Discussed with radiology. Plan to get a CT scan of the chest non- contrast ASAP.   # follow up/work up based on the results of the CT scan.

## 2017-04-11 NOTE — Telephone Encounter (Signed)
Spoke with [name] -  phone [phone] - to f/u on the my chart msg below  My sister just called me to say Moms chest X-ray wasn't normal and you have ordered a CT for in the AM. My sister is not always the best at relaying medical information. Could you please tell me what the chest X-ray showed?    Also, what are the options for treatment? I am currently in [place] as my husband has had shoulder surgery. I need to make plans on whether to come back right away.     Also, do you still want her to increase the Eliquis as planned or wait until after the testing tomorrow?    Daughter Alice Delgado advised the need for the ct scan of chest. I explained to daughter that the apt needed to be in the am, so that Dr. B has allotted time to treat any abn findings of the ct scan. md needs the scan - before providing future tx plans/options. She gave verbal understanding.

## 2017-04-11 NOTE — Telephone Encounter (Signed)
Per Dr Jacinto Reap and Lorretta Harp, NP patient is scheduled for 1:30 today and daughter notified and states seh is unsure if she will come today

## 2017-04-11 NOTE — Progress Notes (Signed)
[facility] NOTE  Patient Care Team: Einar Pheasant, MD as PCP - General (Internal Medicine) Wellington Hampshire, MD as Consulting Physician (Cardiology)  CHIEF COMPLAINTS/PURPOSE OF CONSULTATION: Lung cancer  #  Oncology History   # SEP 2017- SQUAMOUS CELL CA [s/p Bronch Dr.S Khan];PET - LEFT UPPER LOBE MASS- left upper lung collpase; mediastinal LN; Contralateral Right nodules [x3];STAGE IV [contralateral lung nodules]  # PDL-1- 90% [Keytruda]  # Hx of smoking/ COPD/ CKD [creat ~ 1.5]     Primary cancer of left upper lobe of lung ([place])   07/06/2016 Initial Diagnosis    Primary cancer of left upper lobe of lung ([facility])       HISTORY OF PRESENTING ILLNESS:  Alice Delgado 83 y.o.  female with squamous cell lung cancer stage IV; currently on palliative [place]. She is here to be evaluated for worsening left chest wall pain.  Patient was to have the pain the last 10 days or so under the left breast treated with the back. It is 10 on a scale of 10 worst; worse on taking deep breath.chronic cough not any worse. No hemoptysis. Otherwise no fevers or chills. "stays cold". Patient denies any worsening shortness of breath; she continues to be on 3-4 L of oxygen. Denies any bleeding or falls. Patient has not been taking any pain medications.   Patient has been started on Synthroid for hypothyroidism. Complains of a mild rash on the arms and the chest. She  ROS: A complete 10 point review of system is done which is negative except mentioned above in history of present illness  MEDICAL HISTORY:  Past Medical History:  Diagnosis Date  . Asthma   . Diverticulosis   . GERD (gastroesophageal reflux disease)   . Hiatal hernia   . Hypercholesterolemia   . Hyperglycemia   . Hypertension   . Hypothyroidism   . Osteoarthritis    lumbar disc dz, cervical disc dz, hands  . Osteoporosis    intolerance to fosamax, evista and miacalcin  . Peptic ulcer disease   .  Pernicious anemia   . Vitamin D deficiency     SURGICAL HISTORY: Past Surgical History:  Procedure Laterality Date  . ABDOMINAL HYSTERECTOMY     with benign ovarian tumor removed.    Marland Kitchen FLEXIBLE BRONCHOSCOPY N/A 06/16/2016   Procedure: FLEXIBLE BRONCHOSCOPY;  Surgeon: Allyne Gee, MD;  Location: [facility] ORS;  Service: Pulmonary;  Laterality: N/A;  . LUMBAR FUSION      SOCIAL HISTORY:Retired Network engineer. Lives in [place]- 2 daughters; quit smoking in 1985. No alcohol. Social History   Social History  . Marital status: Widowed    Spouse name: N/A  . Number of children: 2  . Years of education: N/A   Occupational History  . Not on file.   Social History Main Topics  . Smoking status: Former Smoker    Years: 15.00    Types: Cigarettes  . Smokeless tobacco: Never Used  . Alcohol use No  . Drug use: No  . Sexual activity: Not on file   Other Topics Concern  . Not on file   Social History Narrative  . No narrative on file    FAMILY HISTORY: Family History  Problem Relation Age of Onset  . Heart disease Father        myocardial infarction  . Heart attack Father   . Cancer Mother        cancer of lymph nodes and kidney  . Breast  cancer Sister   . Colon cancer Neg Hx     ALLERGIES:  is allergic to penicillins; augmentin [amoxicillin-pot clavulanate]; evista [raloxifene]; fosamax [alendronate sodium]; lipitor [atorvastatin]; miralax [polyethylene glycol]; and zetia [ezetimibe].  MEDICATIONS:  Current Outpatient Prescriptions  Medication Sig Dispense Refill  . albuterol (PROAIR HFA) 108 (90 Base) MCG/ACT inhaler Inhale 2 puffs into the lungs every 6 (six) hours as needed for wheezing or shortness of breath. 18 g 5  . albuterol (PROVENTIL) (2.5 MG/3ML) 0.083% nebulizer solution Take 3 mLs (2.5 mg total) by nebulization 2 (two) times daily. DX: EMPHYSEMA DX CODE: J43.8 75 mL 12  . BREO ELLIPTA 100-25 MCG/INH AEPB INHALE 1 PUFF BY MOUTH DAILY 30 each 0  . cyanocobalamin  (,VITAMIN B-12,) 1000 MCG/ML injection Inject 1 mL (1,000 mcg total) into the muscle every 30 (thirty) days. 10 mL 1  . HYDROcodone-acetaminophen (VICODIN) 5-325 mg TABS tablet One pill every night as needed. 60 tablet 0  . hydrOXYzine (ATARAX/VISTARIL) 25 MG tablet Take 1 tablet (25 mg total) by mouth 3 (three) times daily as needed for itching. 60 tablet 1  . ipratropium-albuterol (DUONEB) 0.5-2.5 (3) MG/3ML SOLN Take 3 mLs by nebulization as needed.    Marland Kitchen levothyroxine (SYNTHROID, LEVOTHROID) 88 MCG tablet Take 1 tablet (88 mcg total) by mouth daily before breakfast. 30 tablet 4  . metoprolol tartrate (LOPRESSOR) 50 MG tablet TAKE 1 TABLET(50 MG) BY MOUTH TWICE DAILY 60 tablet 3  . montelukast (SINGULAIR) 10 MG tablet TAKE 1 TABLET(10 MG) BY MOUTH AT BEDTIME 90 tablet 0  . nystatin cream (MYCOSTATIN) Apply 1 application topically 3 (three) times daily.  2  . NYSTATIN powder Apply 1 application topically 4 (four) times daily.  2  . ondansetron (ZOFRAN) 4 MG tablet TK 1 T PO BID PRF NAUSEA  1  . spironolactone (ALDACTONE) 25 MG tablet TAKE 1 TABLET BY MOUTH DAILY 30 tablet 3  . Tiotropium Bromide Monohydrate (SPIRIVA RESPIMAT) 2.5 MCG/ACT AERS Inhale 2 puffs into the lungs daily. 4 g 0  . triamcinolone ointment (KENALOG) 0.5 % Apply 1 application topically 2 (two) times daily. 30 g 0  . apixaban (ELIQUIS) 5 MG TABS tablet Take 1 tablet (5 mg total) by mouth 2 (two) times daily. 60 tablet 5  . nitroGLYCERIN (NITROSTAT) 0.4 MG SL tablet Place 1 tablet (0.4 mg total) under the tongue every 5 (five) minutes as needed for chest pain. (Patient not taking: Reported on 04/01/2017) 25 tablet 2  . predniSONE (DELTASONE) 20 MG tablet Take 1 tablet (20 mg total) by mouth daily with breakfast. Once a day with food x 14 days. 14 tablet 0   No current facility-administered medications for this visit.       Marland Kitchen  PHYSICAL EXAMINATION: ECOG PERFORMANCE STATUS: 1 - Symptomatic but completely ambulatory  Vitals:    04/11/17 1333  BP: 107/75  Pulse: (!) 122  Resp: 18  Temp: 98.8 F (37.1 C)   Filed Weights   04/11/17 1333  Weight: 127 lb 14.4 oz (58 kg)    GENERAL: Well-nourished well-developed; Alert, no distress and comfortable.  With daughter.She is in a wheelchair. She is on 4 L of oxygen. EYES: no pallor or icterus OROPHARYNX: no thrush or ulceration; good dentition  NECK: supple, no masses felt LYMPH:  no palpable lymphadenopathy in the cervical, axillary or inguinal regions LUNGS: Decreased breath sounds on the left side compared to the right.  HEART/CVS: irregular rhythm and no murmurs; No lower extremity edema ABDOMEN: abdomen soft,  non-tender and normal bowel sounds Musculoskeletal:no cyanosis of digits and no clubbing  PSYCH: alert & oriented x 3 with fluent speech NEURO: no focal motor/sensory deficits Skin- multiple ecchymosis. Otherwise no rash.  LABORATORY DATA:  I have reviewed the data as listed Lab Results  Component Value Date   WBC 4.6 04/01/2017   HGB 10.8 (L) 04/01/2017   HCT 32.0 (L) 04/01/2017   MCV 85.8 04/01/2017   PLT 245 04/01/2017    Recent Labs  02/25/17 1045 03/11/17 1255 04/01/17 1405  NA 133* 129* 129*  K 4.3 5.0 4.2  CL 99* 94* 94*  CO2 26 29 26   GLUCOSE 113* 122* 114*  BUN 17 31* 19  CREATININE 1.62* 1.74* 1.41*  CALCIUM 9.6 9.5 9.2  GFRNONAA 28* 26* 33*  GFRAA 32* 30* 38*  PROT 7.5 7.4 7.2  ALBUMIN 4.0 4.1 3.8  AST 28 43* 17  ALT 13* 37 8*  ALKPHOS 81 75 87  BILITOT 0.7 0.6 0.5    RADIOGRAPHIC STUDIES: I have personally reviewed the radiological images as listed and agreed with the findings in the report. Dg Chest 2 View  Result Date: 04/11/2017 CLINICAL DATA:  Left side chest pain.  History of left lung cancer. EXAM: CHEST  2 VIEW COMPARISON:  02/25/2017 FINDINGS: Complete opacification of the left hemithorax. Nodular platelike density noted in the right upper lobe, stable. Nodular right perihilar density also again noted.  Underlying COPD. Heart is difficult to visualize due to opacified left hemithorax. Aortic calcifications. IMPRESSION: Bleed opacification of the left hemithorax. Nodular densities in the right upper lobe and right perihilar region are unchanged. COPD. Electronically Signed   By: Rolm Baptise M.D.   On: 04/11/2017 14:46   Mr Jeri Cos HB Contrast  Result Date: 03/16/2017 CLINICAL DATA:  83 year old female diagnosed with lung cancer in August 2017. Episodes of confusion, abnormal speech. Unsteady gait. EXAM: MRI HEAD WITHOUT AND WITH CONTRAST TECHNIQUE: Multiplanar, multiecho pulse sequences of the brain and surrounding structures were obtained without and with intravenous contrast. CONTRAST:  13m MULTIHANCE GADOBENATE DIMEGLUMINE 529 MG/ML IV SOLN COMPARISON:  Brain MRI 07/20/2016. FINDINGS: Brain: No abnormal intracranial enhancement identified. No midline shift, mass effect, or evidence of intracranial mass lesion. No dural thickening. The possible small abnormal area of diffusion in the left cerebellum seen in October does not persist, and no encephalomalacia is evident. Widely scattered nonenhancing cerebral white matter T2 and FLAIR hyperintensity appears stable since 2017. No cortical encephalomalacia or chronic cerebral blood products are identified. However, a chronic lacunar infarct of the right thalamus has developed. Other deep gray matter nuclei, the brainstem, and cerebellum remain normal for age. No restricted diffusion to suggest acute infarction. No ventriculomegaly, extra-axial collection or acute intracranial hemorrhage. Cervicomedullary junction and pituitary are within normal limits. Vascular: Major intracranial vascular flow voids are preserved. Skull and upper cervical spine: Multilevel cervical spondylolisthesis. Lower cervical disc and endplate degeneration with evidence of degenerative C5-C6 spinal stenosis and spinal cord mass effect (series 2, image 12). Visible bone marrow signal is  normal. Sinuses/Orbits: Stable an negative. Other: Visible internal auditory structures appear normal. Mastoids remain clear. Negative scalp soft tissues. IMPRESSION: 1.  No metastatic disease or acute intracranial abnormality. 2. Small right thalamic lacunar infarct is chronic but new since October. 3. Degenerative cervical spinal stenosis with spinal cord mass effect at C5-C6. Electronically Signed   By: HGenevie AnnM.D.   On: 03/16/2017 15:41    ASSESSMENT & PLAN:   Primary cancer of  left upper lobe of lung ([place])  # Squamous cell carcinoma of the left upper lobe with atelectasis left upper lung; and also contralateral lung nodules- stage IV lung cancer. On palliative [place]. CT scan April 16th 2018- CT shows slight increase in the left LUL central mass/ ~4.5cm; chronic left upper lobe collapse. Also shows a tiny 5 mm new lung Nodule.    # currently status post 10 cycles of Keytruda; last approximately 10 days ago. Concern for clinical progression- C discussion below.   # left chest wall pain- unclear etiology. Chest x-ray stat. Also recommend increasing the dose of Eliquis to 5 twice a day- as at high risk for PE [unable to get a protocol CT given her renal failure]. Question pleurisy- recommend prednisone 10 mg once a day.   # skin rash- grade 1 related to Keytruda. Recommend prednisone 20 mg a day for 10 days.  # hypothyroidism- May 2018 TSH 70s; currently on Synthroid. Repeat thyroid profile at next visit  # addendum- chest x-ray showed whiteout of the left lung- more likely atelectasis given the absence of mediastinal shift to the other side. Discussed with radiology. Plan to get a CT scan of the chest non- contrast ASAP.   # follow up/work up based on the results of the CT scan.        Cammie Sickle, MD 04/11/2017 7:49 PM

## 2017-04-11 NOTE — Telephone Encounter (Addendum)
RN spoke with daughter Janis-provided cxr results. Explained the need for a ct scan of chest tomorrow to further evaluate the hemothorax. Daughter states that she could transport her mother tomorrow. Pt is currently stable per Dr. B and could wait until tomorrow for her ct scan.

## 2017-04-11 NOTE — Telephone Encounter (Signed)
Called to request an appt to see Dr Rogue Bussing today, Had called last week with substernal chest pain radiating through to back. She had been advised to take her to the ER, but I see no ER visits. She had also been advised to call Dr Nicki Reaper, again I see no documentation that she called Dr Nicki Reaper either. Please advise

## 2017-04-12 ENCOUNTER — Telehealth: Payer: Self-pay | Admitting: *Deleted

## 2017-04-12 ENCOUNTER — Other Ambulatory Visit: Payer: Self-pay | Admitting: *Deleted

## 2017-04-12 ENCOUNTER — Ambulatory Visit
Admission: RE | Admit: 2017-04-12 | Discharge: 2017-04-12 | Disposition: A | Discharge status: Home / Self Care | Payer: Medicare Other | Source: Ambulatory Visit | Attending: Internal Medicine | Admitting: Internal Medicine

## 2017-04-12 DIAGNOSIS — C3412 Malignant neoplasm of upper lobe, left bronchus or lung: Secondary | ICD-10-CM | POA: Diagnosis not present

## 2017-04-12 DIAGNOSIS — R590 Localized enlarged lymph nodes: Secondary | ICD-10-CM | POA: Diagnosis not present

## 2017-04-12 DIAGNOSIS — I2510 Atherosclerotic heart disease of native coronary artery without angina pectoris: Secondary | ICD-10-CM | POA: Insufficient documentation

## 2017-04-12 DIAGNOSIS — R079 Chest pain, unspecified: Secondary | ICD-10-CM | POA: Diagnosis not present

## 2017-04-12 DIAGNOSIS — J910 Malignant pleural effusion: Secondary | ICD-10-CM | POA: Diagnosis not present

## 2017-04-12 DIAGNOSIS — J942 Hemothorax: Secondary | ICD-10-CM | POA: Diagnosis not present

## 2017-04-12 DIAGNOSIS — J90 Pleural effusion, not elsewhere classified: Secondary | ICD-10-CM

## 2017-04-12 DIAGNOSIS — I700 Atherosclerosis of aorta: Secondary | ICD-10-CM | POA: Diagnosis not present

## 2017-04-12 DIAGNOSIS — J9811 Atelectasis: Secondary | ICD-10-CM | POA: Diagnosis not present

## 2017-04-12 NOTE — Telephone Encounter (Signed)
Per md order- orders entered to sch stat thoracentesis tomorrow.  pt also needs to be added to DR. B's schedule for see md - on 6/28 at 145pm per Dr. Jacinto Reap. Sch. msg entered.  Pt and pt's daughter made aware. md also discussed results of scan with pt and [name]. Pt agreeable to plan of care.

## 2017-04-12 NOTE — Telephone Encounter (Signed)
-----   Message from Elouise Munroe sent at 04/12/2017  8:39 AM EDT ----- Regarding: meds Contact: [phone] Hi her daughter Cecille Rubin talked to you yesterday and forgot to ask what meds Dr B increased...thx Nira Conn

## 2017-04-12 NOTE — Telephone Encounter (Signed)
RN replied back to pt's daughter via Comptroller

## 2017-04-13 ENCOUNTER — Ambulatory Visit
Admission: RE | Admit: 2017-04-13 | Discharge: 2017-04-13 | Disposition: A | Discharge status: Home / Self Care | Payer: Medicare Other | Source: Ambulatory Visit | Attending: Interventional Radiology | Admitting: Interventional Radiology

## 2017-04-13 ENCOUNTER — Ambulatory Visit: Payer: Medicare Other

## 2017-04-13 ENCOUNTER — Ambulatory Visit
Admission: RE | Admit: 2017-04-13 | Discharge: 2017-04-13 | Disposition: A | Discharge status: Home / Self Care | Payer: Medicare Other | Source: Ambulatory Visit | Attending: Internal Medicine | Admitting: Internal Medicine

## 2017-04-13 ENCOUNTER — Encounter: Payer: Self-pay | Admitting: Cardiovascular Disease

## 2017-04-13 DIAGNOSIS — J90 Pleural effusion, not elsewhere classified: Secondary | ICD-10-CM | POA: Diagnosis not present

## 2017-04-13 DIAGNOSIS — R918 Other nonspecific abnormal finding of lung field: Secondary | ICD-10-CM | POA: Diagnosis not present

## 2017-04-13 DIAGNOSIS — J449 Chronic obstructive pulmonary disease, unspecified: Secondary | ICD-10-CM | POA: Diagnosis not present

## 2017-04-13 LAB — GLUCOSE, CAPILLARY: GLUCOSE-CAPILLARY: 120 mg/dL — AB (ref 65–99)

## 2017-04-13 NOTE — Discharge Instructions (Signed)
Thoracentesis, Care After Refer to this sheet in the next few weeks. These instructions provide you with information about caring for yourself after your procedure. Your health care provider may also give you more specific instructions. Your treatment has been planned according to current medical practices, but problems sometimes occur. Call your health care provider if you have any problems or questions after your procedure. What can I expect after the procedure? After your procedure, it is common to have pain at the puncture site. Follow these instructions at home:  Take medicines only as directed by your health care provider.  You may return to your normal diet and normal activities as directed by your health care provider.  Drink enough fluid to keep your urine clear or pale yellow.  Do not take baths, swim, or use a hot tub until your health care provider approves.  Follow your health care provider's instructions about: ? Puncture site care. ? Bandage (dressing) changes and removal.  Check your puncture site every day for signs of infection. Watch for: ? Redness, swelling, or pain. ? Fluid, blood, or pus.  Keep all follow-up visits as directed by your health care provider. This is important. Contact a health care provider if:  You have redness, swelling, or pain at your puncture site.  You have fluid, blood, or pus coming from your puncture site.  You have a fever.  You have chills.  You have nausea or vomiting.  You have trouble breathing.  You develop a worsening cough. Get help right away if:  You have extreme shortness of breath.  You develop chest pain.  You faint or feel light-headed. This information is not intended to replace advice given to you by your health care provider. Make sure you discuss any questions you have with your health care provider. Document Released: 10/25/2014 Document Revised: 06/05/2016 Document Reviewed: 07/16/2014 Elsevier  Interactive Patient Education  [employer].

## 2017-04-13 NOTE — Progress Notes (Signed)
FSBS-120.  BP drops when sitting up-see flowsheet.

## 2017-04-13 NOTE — Procedures (Signed)
Lung ca, large left eff  S/p [place] LEFT THORACENTESIS  850CC  NO COMP STABLE CXR PENDING FULL REPORT IN PACS

## 2017-04-14 ENCOUNTER — Inpatient Hospital Stay (HOSPITAL_BASED_OUTPATIENT_CLINIC_OR_DEPARTMENT_OTHER): Payer: Medicare Other | Admitting: Internal Medicine

## 2017-04-14 DIAGNOSIS — K219 Gastro-esophageal reflux disease without esophagitis: Secondary | ICD-10-CM | POA: Diagnosis not present

## 2017-04-14 DIAGNOSIS — C3412 Malignant neoplasm of upper lobe, left bronchus or lung: Secondary | ICD-10-CM

## 2017-04-14 DIAGNOSIS — Z880 Allergy status to penicillin: Secondary | ICD-10-CM

## 2017-04-14 DIAGNOSIS — I129 Hypertensive chronic kidney disease with stage 1 through stage 4 chronic kidney disease, or unspecified chronic kidney disease: Secondary | ICD-10-CM | POA: Diagnosis not present

## 2017-04-14 DIAGNOSIS — N183 Chronic kidney disease, stage 3 (moderate): Secondary | ICD-10-CM | POA: Diagnosis not present

## 2017-04-14 DIAGNOSIS — E7800 Pure hypercholesterolemia, unspecified: Secondary | ICD-10-CM

## 2017-04-14 DIAGNOSIS — D510 Vitamin B12 deficiency anemia due to intrinsic factor deficiency: Secondary | ICD-10-CM

## 2017-04-14 DIAGNOSIS — M1990 Unspecified osteoarthritis, unspecified site: Secondary | ICD-10-CM

## 2017-04-14 DIAGNOSIS — R918 Other nonspecific abnormal finding of lung field: Secondary | ICD-10-CM

## 2017-04-14 DIAGNOSIS — Z8051 Family history of malignant neoplasm of kidney: Secondary | ICD-10-CM

## 2017-04-14 DIAGNOSIS — I482 Chronic atrial fibrillation: Secondary | ICD-10-CM | POA: Diagnosis not present

## 2017-04-14 DIAGNOSIS — Z79899 Other long term (current) drug therapy: Secondary | ICD-10-CM | POA: Diagnosis not present

## 2017-04-14 DIAGNOSIS — E559 Vitamin D deficiency, unspecified: Secondary | ICD-10-CM | POA: Diagnosis not present

## 2017-04-14 DIAGNOSIS — J449 Chronic obstructive pulmonary disease, unspecified: Secondary | ICD-10-CM

## 2017-04-14 DIAGNOSIS — E039 Hypothyroidism, unspecified: Secondary | ICD-10-CM | POA: Diagnosis not present

## 2017-04-14 DIAGNOSIS — C771 Secondary and unspecified malignant neoplasm of intrathoracic lymph nodes: Secondary | ICD-10-CM

## 2017-04-14 DIAGNOSIS — Z5112 Encounter for antineoplastic immunotherapy: Secondary | ICD-10-CM

## 2017-04-14 DIAGNOSIS — Z87891 Personal history of nicotine dependence: Secondary | ICD-10-CM | POA: Diagnosis not present

## 2017-04-14 DIAGNOSIS — Z803 Family history of malignant neoplasm of breast: Secondary | ICD-10-CM | POA: Diagnosis not present

## 2017-04-14 DIAGNOSIS — R21 Rash and other nonspecific skin eruption: Secondary | ICD-10-CM | POA: Diagnosis not present

## 2017-04-14 DIAGNOSIS — R300 Dysuria: Secondary | ICD-10-CM

## 2017-04-14 DIAGNOSIS — M810 Age-related osteoporosis without current pathological fracture: Secondary | ICD-10-CM

## 2017-04-14 DIAGNOSIS — Z7901 Long term (current) use of anticoagulants: Secondary | ICD-10-CM | POA: Diagnosis not present

## 2017-04-14 DIAGNOSIS — R079 Chest pain, unspecified: Secondary | ICD-10-CM | POA: Diagnosis not present

## 2017-04-14 DIAGNOSIS — Z808 Family history of malignant neoplasm of other organs or systems: Secondary | ICD-10-CM

## 2017-04-14 DIAGNOSIS — Z7189 Other specified counseling: Secondary | ICD-10-CM

## 2017-04-14 NOTE — Progress Notes (Signed)
Alice Delgado NOTE  Patient Care Team: Einar Pheasant, MD as PCP - General (Internal Medicine) Wellington Hampshire, MD as Consulting Physician (Cardiology)  CHIEF COMPLAINTS/PURPOSE OF CONSULTATION: Lung cancer  #  Oncology History   # SEP 2017- SQUAMOUS CELL CA [s/p Bronch Dr.S Khan];PET - LEFT UPPER LOBE MASS- left upper lung collpase; mediastinal LN; Contralateral Right nodules [x3];STAGE IV [contralateral lung nodules]  # PDL-1- 90% [Keytruda]; START NOV 2017; June 2018- PROGRESSION  # IATROGENIC HYPOTHYROIDISM  # Hx of smoking/ COPD/ CKD [creat ~ 1.5]     Primary cancer of left upper lobe of lung ([place])   07/06/2016 Initial Diagnosis    Primary cancer of left upper lobe of lung ([facility])       HISTORY OF PRESENTING ILLNESS:  Alice Delgado 83 y.o.  female with squamous cell lung cancer stage IV; currently on palliative Beryle Flock- Is here to review the results of her CT scan that was ordered for worsening left chest wall pain.   Patient had a thoracentesis on the left side yesterday. She did not notice any significant improvement in her respiratory status.   No hemoptysis. Otherwise no fevers or chills. "stays cold".  Denies any bleeding or falls. She continues to take half of pain pill approximately 3 times a day for the left chest wall pain. She started taking the prednisone 2 days ago.   ROS: A complete 10 point review of system is done which is negative except mentioned above in history of present illness  MEDICAL HISTORY:  Past Medical History:  Diagnosis Date  . Asthma   . Diverticulosis   . GERD (gastroesophageal reflux disease)   . Hiatal hernia   . Hypercholesterolemia   . Hyperglycemia   . Hypertension   . Hypothyroidism   . Osteoarthritis    lumbar disc dz, cervical disc dz, hands  . Osteoporosis    intolerance to fosamax, evista and miacalcin  . Peptic ulcer disease   . Pernicious anemia   . Vitamin D deficiency     SURGICAL  HISTORY: Past Surgical History:  Procedure Laterality Date  . ABDOMINAL HYSTERECTOMY     with benign ovarian tumor removed.    Marland Kitchen FLEXIBLE BRONCHOSCOPY N/A 06/16/2016   Procedure: FLEXIBLE BRONCHOSCOPY;  Surgeon: Allyne Gee, MD;  Location: [facility] ORS;  Service: Pulmonary;  Laterality: N/A;  . LUMBAR FUSION      SOCIAL HISTORY:Retired Network engineer. Lives in [place]- 2 daughters; quit smoking in 1985. No alcohol. Social History   Social History  . Marital status: Widowed    Spouse name: N/A  . Number of children: 2  . Years of education: N/A   Occupational History  . Not on file.   Social History Main Topics  . Smoking status: Former Smoker    Years: 15.00    Types: Cigarettes  . Smokeless tobacco: Never Used  . Alcohol use No  . Drug use: No  . Sexual activity: Not on file   Other Topics Concern  . Not on file   Social History Narrative  . No narrative on file    FAMILY HISTORY: Family History  Problem Relation Age of Onset  . Heart disease Father        myocardial infarction  . Heart attack Father   . Cancer Mother        cancer of lymph nodes and kidney  . Breast cancer Sister   . Colon cancer Neg Hx     ALLERGIES:  is allergic to penicillins; augmentin [amoxicillin-pot clavulanate]; evista [raloxifene]; fosamax [alendronate sodium]; lipitor [atorvastatin]; miralax [polyethylene glycol]; and zetia [ezetimibe].  MEDICATIONS:  Current Outpatient Prescriptions  Medication Sig Dispense Refill  . albuterol (PROAIR HFA) 108 (90 Base) MCG/ACT inhaler Inhale 2 puffs into the lungs every 6 (six) hours as needed for wheezing or shortness of breath. 18 g 5  . albuterol (PROVENTIL) (2.5 MG/3ML) 0.083% nebulizer solution Take 3 mLs (2.5 mg total) by nebulization 2 (two) times daily. DX: EMPHYSEMA DX CODE: J43.8 75 mL 12  . apixaban (ELIQUIS) 5 MG TABS tablet Take 1 tablet (5 mg total) by mouth 2 (two) times daily. 60 tablet 5  . BREO ELLIPTA 100-25 MCG/INH AEPB INHALE 1 PUFF  BY MOUTH DAILY 30 each 0  . cyanocobalamin (,VITAMIN B-12,) 1000 MCG/ML injection Inject 1 mL (1,000 mcg total) into the muscle every 30 (thirty) days. 10 mL 1  . HYDROcodone-acetaminophen (VICODIN) 5-325 mg TABS tablet One pill every night as needed. 60 tablet 0  . hydrOXYzine (ATARAX/VISTARIL) 25 MG tablet Take 1 tablet (25 mg total) by mouth 3 (three) times daily as needed for itching. 60 tablet 1  . ipratropium-albuterol (DUONEB) 0.5-2.5 (3) MG/3ML SOLN Take 3 mLs by nebulization as needed.    Marland Kitchen levothyroxine (SYNTHROID, LEVOTHROID) 88 MCG tablet Take 1 tablet (88 mcg total) by mouth daily before breakfast. 30 tablet 4  . metoprolol tartrate (LOPRESSOR) 50 MG tablet TAKE 1 TABLET(50 MG) BY MOUTH TWICE DAILY 60 tablet 3  . montelukast (SINGULAIR) 10 MG tablet TAKE 1 TABLET(10 MG) BY MOUTH AT BEDTIME 90 tablet 0  . nitroGLYCERIN (NITROSTAT) 0.4 MG SL tablet Place 1 tablet (0.4 mg total) under the tongue every 5 (five) minutes as needed for chest pain. 25 tablet 2  . nystatin cream (MYCOSTATIN) Apply 1 application topically 3 (three) times daily.  2  . NYSTATIN powder Apply 1 application topically 4 (four) times daily.  2  . ondansetron (ZOFRAN) 4 MG tablet TK 1 T PO BID PRF NAUSEA  1  . predniSONE (DELTASONE) 20 MG tablet Take 1 tablet (20 mg total) by mouth daily with breakfast. Once a day with food x 14 days. 14 tablet 0  . spironolactone (ALDACTONE) 25 MG tablet TAKE 1 TABLET BY MOUTH DAILY 30 tablet 0  . Tiotropium Bromide Monohydrate (SPIRIVA RESPIMAT) 2.5 MCG/ACT AERS Inhale 2 puffs into the lungs daily. 4 g 0  . triamcinolone ointment (KENALOG) 0.5 % Apply 1 application topically 2 (two) times daily. 30 g 0   No current facility-administered medications for this visit.       Marland Kitchen  PHYSICAL EXAMINATION: ECOG PERFORMANCE STATUS: 1 - Symptomatic but completely ambulatory  Vitals:   04/14/17 1347  BP: 102/74  Pulse: (!) 118  Resp: (!) 24  Temp: 97.6 F (36.4 C)   Filed Weights    04/14/17 1347  Weight: 129 lb (58.5 kg)    GENERAL: Well-nourished well-developed; Alert, no distress and comfortable.  With daughters/ neice.She is in a wheelchair. She is on 4 L of oxygen. EYES: no pallor or icterus OROPHARYNX: no thrush or ulceration; good dentition  NECK: supple, no masses felt LYMPH:  no palpable lymphadenopathy in the cervical, axillary or inguinal regions LUNGS: Decreased breath sounds on the left side compared to the right.  HEART/CVS: irregular rhythm and no murmurs; No lower extremity edema ABDOMEN: abdomen soft, non-tender and normal bowel sounds Musculoskeletal:no cyanosis of digits and no clubbing  PSYCH: alert & oriented x 3 with fluent  speech NEURO: no focal motor/sensory deficits Skin- multiple ecchymosis. Otherwise no rash.  LABORATORY DATA:  I have reviewed the data as listed Lab Results  Component Value Date   WBC 4.6 04/01/2017   HGB 10.8 (L) 04/01/2017   HCT 32.0 (L) 04/01/2017   MCV 85.8 04/01/2017   PLT 245 04/01/2017    Recent Labs  02/25/17 1045 03/11/17 1255 04/01/17 1405  NA 133* 129* 129*  K 4.3 5.0 4.2  CL 99* 94* 94*  CO2 _0 GLUCOSE 113* 122* 114*  BUN 17 31* 19  CREATININE 1.62* 1.74* 1.41*  CALCIUM 9.6 9.5 9.2  GFRNONAA 28* 26* 33*  GFRAA 32* 30* 38*  PROT 7.5 7.4 7.2  ALBUMIN 4.0 4.1 3.8  AST 28 43* 17  ALT 13* 37 8*  ALKPHOS 81 75 87  BILITOT 0.7 0.6 0.5    RADIOGRAPHIC STUDIES: I have personally reviewed the radiological images as listed and agreed with the findings in the report. Dg Chest 2 View  Result Date: 04/11/2017 CLINICAL DATA:  Left side chest pain.  History of left lung cancer. EXAM: CHEST  2 VIEW COMPARISON:  02/25/2017 FINDINGS: Complete opacification of the left hemithorax. Nodular platelike density noted in the right upper lobe, stable. Nodular right perihilar density also again noted. Underlying COPD. Heart is difficult to visualize due to opacified left hemithorax. Aortic calcifications.  IMPRESSION: Bleed opacification of the left hemithorax. Nodular densities in the right upper lobe and right perihilar region are unchanged. COPD. Electronically Signed   By: Rolm Baptise M.D.   On: 04/11/2017 14:46   Ct Chest Wo Contrast  Result Date: 04/12/2017 CLINICAL DATA:  83 year old female with worsening shortness of breath with exertion and left-sided chest pain for the last week and a half. History of left-sided lung cancer diagnosed in August 2017 treated with radiation therapy completed in October 2017. Former smoker (quit 30 years ago). EXAM: CT CHEST WITHOUT CONTRAST TECHNIQUE: Multidetector CT imaging of the chest was performed following the standard protocol without IV contrast. COMPARISON:  Chest CT 01/31/2017. FINDINGS: Cardiovascular: Heart size is normal. Trace amount of pericardial fluid and/or thickening, unlikely to be of hemodynamic significance at this time. No associated pericardial calcification. There is aortic atherosclerosis, as well as atherosclerosis of the great vessels of the mediastinum and the coronary arteries, including calcified atherosclerotic plaque in the left main, left anterior descending and right coronary arteries. Calcifications of the mitral annulus. Mediastinum/Nodes: Multiple enlarged mediastinal lymph nodes, largest of which is a 2.4 cm short axis subcarinal lymph node (previously 2.3 cm). Low right paratracheal lymph node has markedly increased in size currently measuring 2.1 cm in short axis (previously 13 mm in short axis). In addition, there is a a high right paraesophageal lymph node which currently measures 13 mm in short axis (previously only 8 mm), in addition to other enlarging mediastinal lymph nodes. Esophagus is unremarkable in appearance. No axillary lymphadenopathy. Lungs/Pleura: There is now complete atelectasis of the entire left lung with a large pleural effusion. Previously noted left-sided masses are now obscured. Increased number and size of  numerous right-sided pulmonary nodules and masses, compatible with progressive disease. The largest of these is the lesion in the right upper lobe which currently measures 2.3 x 2.8 x 3.6 cm (axial image 40 of series 3 and coronal image 73 of series 5). The previously noted partially cavitary right lower lobe nodule is also slightly increased in size measuring 2.5 x 1.8 cm (axial image 77 of  series 3). Several other smaller nodules have significantly increased in size compared to the prior examination, most notable for a lesion in the right middle lobe (image 125 of series 3) which measures 11 x 7 mm, and a lesion in the medial aspect of the right upper lobe (image 73 of series 3) measuring 6 x 9 mm. Upper Abdomen: Aortic atherosclerosis. Musculoskeletal: Very subtle area of sclerosis in the lateral aspect of the right fifth rib (axial image 85 of series 3), which could represent a metastatic lesion. IMPRESSION: 1. Progressive widespread metastatic disease in the lungs and worsening mediastinal lymphadenopathy, as detailed above. Notably, there is now complete atelectasis of the left lung and a large malignant left pleural effusion. 2. Subtle areas sclerosis in the lateral aspect of the right fifth rib which may represent a metastatic lesion. 3. Aortic atherosclerosis, in addition to left main and 2 vessel coronary artery disease. Aortic Atherosclerosis (ICD10-I70.0). Electronically Signed   By: Vinnie Langton M.D.   On: 04/12/2017 10:22   Mr Jeri Cos XJ Contrast  Result Date: 03/16/2017 CLINICAL DATA:  83 year old female diagnosed with lung cancer in August 2017. Episodes of confusion, abnormal speech. Unsteady gait. EXAM: MRI HEAD WITHOUT AND WITH CONTRAST TECHNIQUE: Multiplanar, multiecho pulse sequences of the brain and surrounding structures were obtained without and with intravenous contrast. CONTRAST:  46m MULTIHANCE GADOBENATE DIMEGLUMINE 529 MG/ML IV SOLN COMPARISON:  Brain MRI 07/20/2016. FINDINGS:  Brain: No abnormal intracranial enhancement identified. No midline shift, mass effect, or evidence of intracranial mass lesion. No dural thickening. The possible small abnormal area of diffusion in the left cerebellum seen in October does not persist, and no encephalomalacia is evident. Widely scattered nonenhancing cerebral white matter T2 and FLAIR hyperintensity appears stable since 2017. No cortical encephalomalacia or chronic cerebral blood products are identified. However, a chronic lacunar infarct of the right thalamus has developed. Other deep gray matter nuclei, the brainstem, and cerebellum remain normal for age. No restricted diffusion to suggest acute infarction. No ventriculomegaly, extra-axial collection or acute intracranial hemorrhage. Cervicomedullary junction and pituitary are within normal limits. Vascular: Major intracranial vascular flow voids are preserved. Skull and upper cervical spine: Multilevel cervical spondylolisthesis. Lower cervical disc and endplate degeneration with evidence of degenerative C5-C6 spinal stenosis and spinal cord mass effect (series 2, image 12). Visible bone marrow signal is normal. Sinuses/Orbits: Stable an negative. Other: Visible internal auditory structures appear normal. Mastoids remain clear. Negative scalp soft tissues. IMPRESSION: 1.  No metastatic disease or acute intracranial abnormality. 2. Small right thalamic lacunar infarct is chronic but new since October. 3. Degenerative cervical spinal stenosis with spinal cord mass effect at C5-C6. Electronically Signed   By: HGenevie AnnM.D.   On: 03/16/2017 15:41   Dg Chest Port 1 View  Result Date: 04/13/2017 CLINICAL DATA:  Status post left thoracentesis removing 850 cc EXAM: PORTABLE CHEST 1 VIEW COMPARISON:  04/11/2017 FINDINGS: Complete opacification persist of the left hemithorax compatible with left lung collapse/ consolidation and residual left effusion despite removing 850 cc. No pneumothorax. Cardiac and  mediastinal contours are obscured. Right lung remains aerated. Persistent right upper lobe masslike opacity. Atherosclerosis of aorta. Degenerative changes of the spine with an associated scoliosis. IMPRESSION: No pneumothorax following 850 cc left thoracentesis. Persistent complete opacification of the left hemithorax compatible with complete left lung collapse/ consolidation and residual left effusion. Electronically Signed   By: MJerilynn Mages  Shick M.D.   On: 04/13/2017 12:02   [place]oracentesis Asp Pleural Space W/img Guide  Result Date: 04/13/2017 INDICATION: Lung cancer, large left effusion, shortness of breath EXAM: ULTRASOUND GUIDED LEFT THORACENTESIS MEDICATIONS: 1% lidocaine local COMPLICATIONS: None immediate. PROCEDURE: An ultrasound guided thoracentesis was thoroughly discussed with the patient and questions answered. The benefits, risks, alternatives and complications were also discussed. The patient understands and wishes to proceed with the procedure. Written consent was obtained. Ultrasound was performed to localize and mark an adequate pocket of fluid in the left chest. The area was then prepped and draped in the normal sterile fashion. 1% Lidocaine was used for local anesthesia. Under ultrasound guidance a 6 Fr Safe-T-Centesis catheter was introduced. Thoracentesis was performed. The catheter was removed and a dressing applied. FINDINGS: A total of approximately 850 cc of clear serosanguineous pleural fluid was removed. Sample was not sent for laboratory analysis IMPRESSION: Successful ultrasound guided left thoracentesis yielding 850 cc of pleural fluid. Electronically Signed   By: Jerilynn Mages.  Shick M.D.   On: 04/13/2017 12:08    ASSESSMENT & PLAN:   Primary cancer of left upper lobe of lung ([place])  # Squamous cell carcinoma of the left upper lobe with atelectasis left upper lung; and also contralateral lung nodules- stage IV lung cancer. On palliative [place].  CT June 26th 2018- complete collapse of the  left lung; pleural effusion; increasing lung nodules/suggestive progressive disease.  # Long discussion the patient and family regarding the seriousness of the illness. Given the progression of disease I would recommend discussion patient of Keytruda at this time.   # Discussed regarding the subsequent line of treatment including chemotherapy with Abraxane versus hospice. Discussed the potential side effects of Abraxane including but not limited to fatigue drop in blood counts neuropathy. Patient is not sure at this time. She wants to think about it. She will let us know. Discussed all treatments of palliative not curative.  # I encouraged to have the daughter call the palliative care nurse to reestablish communication/possibly for hospice.    # Follow-up with me as scheduled on first week of July/ Abraxane/labs.  # I reviewed the blood work- with the patient in detail; also reviewed the imaging independently [as summarized above]; and with the patient in detail.   # 40 minutes face-to-face with the patient discussing the above plan of care; more than 50% of time spent on prognosis/ natural history; counseling and coordination.     Cammie Sickle, MD 04/14/2017 4:27 PM

## 2017-04-14 NOTE — Progress Notes (Signed)
DISCONTINUE ON PATHWAY REGIMEN - Non-Small Cell Lung     A cycle is 21 days:     Pembrolizumab        Dose Mod: None  **Always confirm dose/schedule in your pharmacy ordering system**    REASON: Disease Progression PRIOR TREATMENT: COD056: Pembrolizumab 200 mg q21 Days Until Disease Progression, Unacceptable Toxicity, or up to 24 Months TREATMENT RESPONSE: Progressive Disease (PD)  START ON PATHWAY REGIMEN - Non-Small Cell Lung     A cycle is every 21 days:     Nab-paclitaxel (protein bound)      Carboplatin   **Always confirm dose/schedule in your pharmacy ordering system**    Patient Characteristics: Stage IV Metastatic, Squamous, PS = 0, 1, Second Line, Prior PD-1 / PD-L1 Inhibitor AJCC T Category: T4 Current Disease Status: Distant Metastases AJCC N Category: N2 AJCC M Category: M1a AJCC 8 Stage Grouping: IVA Histology: Squamous Cell Line of therapy: Second Line PD-L1 Expression Status: PD-L1 Positive >= 50% (TPS) Performance Status: PS = 0, 1 Would you be surprised if this patient died  in the next year? I would be surprised if this patient died in the next year Immunotherapy Candidate Status: Not a Candidate for Immunotherapy Prior Immunotherapy Status: Prior PD-1/PD-L1 Inhibitor Intent of Therapy: Non-Curative / Palliative Intent, Discussed with Patient

## 2017-04-14 NOTE — Assessment & Plan Note (Addendum)
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 On palliative [place].  CT June 26th 2018- complete collapse of the left lung; pleural effusion; increasing lung nodules/suggestive progressive disease.  # Long discussion the patient and family regarding the seriousness of the illness. Given the progression of disease I would recommend discussion patient of Keytruda at this time.   # Discussed regarding the subsequent line of treatment including chemotherapy with Abraxane versus hospice. Discussed the potential side effects of Abraxane including but not limited to fatigue drop in blood counts neuropathy. Patient is not sure at this time. She wants to think about it. She will let us know. Discussed all treatments of palliative not curative.  # I encouraged to have the daughter call the palliative care nurse to reestablish communication/possibly for hospice.    # Follow-up with me as scheduled on first week of July/ Abraxane/labs.  # I reviewed the blood work- with the patient in detail; also reviewed the imaging independently [as summarized above]; and with the patient in detail.   # 40 minutes face-to-face with the patient discussing the above plan of care; more than 50% of time spent on prognosis/ natural history; counseling and coordination.

## 2017-04-14 NOTE — Progress Notes (Signed)
Pt here for pt/family conference to discuss findings of ct scan results.  Pt reports minimal improvement in shortness of breath since thoracentesis yesterday.

## 2017-04-15 DIAGNOSIS — C3412 Malignant neoplasm of upper lobe, left bronchus or lung: Secondary | ICD-10-CM | POA: Diagnosis not present

## 2017-04-15 DIAGNOSIS — Z515 Encounter for palliative care: Secondary | ICD-10-CM | POA: Diagnosis not present

## 2017-04-17 DIAGNOSIS — J449 Chronic obstructive pulmonary disease, unspecified: Secondary | ICD-10-CM | POA: Diagnosis not present

## 2017-04-19 ENCOUNTER — Other Ambulatory Visit: Payer: Self-pay | Admitting: Internal Medicine

## 2017-04-21 ENCOUNTER — Encounter: Payer: Self-pay | Admitting: Internal Medicine

## 2017-04-21 MED ORDER — MONTELUKAST SODIUM 10 MG PO TABS: 10.0000 mg | ORAL_TABLET | Freq: Every day | ORAL | 0 refills | Status: AC

## 2017-04-22 ENCOUNTER — Inpatient Hospital Stay: Payer: Medicare Other

## 2017-04-22 ENCOUNTER — Inpatient Hospital Stay: Payer: Medicare Other | Attending: Internal Medicine | Admitting: Internal Medicine

## 2017-04-22 VITALS — BP 119/74 | HR 63 | Temp 98.7°F | Resp 18 | Wt 126.2 lb

## 2017-04-22 DIAGNOSIS — M810 Age-related osteoporosis without current pathological fracture: Secondary | ICD-10-CM | POA: Diagnosis not present

## 2017-04-22 DIAGNOSIS — E559 Vitamin D deficiency, unspecified: Secondary | ICD-10-CM | POA: Insufficient documentation

## 2017-04-22 DIAGNOSIS — R5381 Other malaise: Secondary | ICD-10-CM | POA: Insufficient documentation

## 2017-04-22 DIAGNOSIS — J449 Chronic obstructive pulmonary disease, unspecified: Secondary | ICD-10-CM | POA: Insufficient documentation

## 2017-04-22 DIAGNOSIS — E032 Hypothyroidism due to medicaments and other exogenous substances: Secondary | ICD-10-CM | POA: Insufficient documentation

## 2017-04-22 DIAGNOSIS — C3412 Malignant neoplasm of upper lobe, left bronchus or lung: Secondary | ICD-10-CM

## 2017-04-22 DIAGNOSIS — I4891 Unspecified atrial fibrillation: Secondary | ICD-10-CM | POA: Diagnosis not present

## 2017-04-22 DIAGNOSIS — M1990 Unspecified osteoarthritis, unspecified site: Secondary | ICD-10-CM | POA: Diagnosis not present

## 2017-04-22 DIAGNOSIS — Z7901 Long term (current) use of anticoagulants: Secondary | ICD-10-CM | POA: Insufficient documentation

## 2017-04-22 DIAGNOSIS — K219 Gastro-esophageal reflux disease without esophagitis: Secondary | ICD-10-CM | POA: Diagnosis not present

## 2017-04-22 DIAGNOSIS — Z87891 Personal history of nicotine dependence: Secondary | ICD-10-CM | POA: Diagnosis not present

## 2017-04-22 DIAGNOSIS — Z800 Family history of malignant neoplasm of digestive organs: Secondary | ICD-10-CM | POA: Insufficient documentation

## 2017-04-22 DIAGNOSIS — Z79899 Other long term (current) drug therapy: Secondary | ICD-10-CM | POA: Insufficient documentation

## 2017-04-22 DIAGNOSIS — Z803 Family history of malignant neoplasm of breast: Secondary | ICD-10-CM

## 2017-04-22 DIAGNOSIS — Z9225 Personal history of immunosupression therapy: Secondary | ICD-10-CM | POA: Insufficient documentation

## 2017-04-22 DIAGNOSIS — N189 Chronic kidney disease, unspecified: Secondary | ICD-10-CM

## 2017-04-22 DIAGNOSIS — Z7952 Long term (current) use of systemic steroids: Secondary | ICD-10-CM | POA: Insufficient documentation

## 2017-04-22 DIAGNOSIS — Z5111 Encounter for antineoplastic chemotherapy: Secondary | ICD-10-CM | POA: Insufficient documentation

## 2017-04-22 DIAGNOSIS — I129 Hypertensive chronic kidney disease with stage 1 through stage 4 chronic kidney disease, or unspecified chronic kidney disease: Secondary | ICD-10-CM | POA: Diagnosis not present

## 2017-04-22 DIAGNOSIS — R110 Nausea: Secondary | ICD-10-CM | POA: Insufficient documentation

## 2017-04-22 DIAGNOSIS — Z9981 Dependence on supplemental oxygen: Secondary | ICD-10-CM | POA: Insufficient documentation

## 2017-04-22 DIAGNOSIS — Z880 Allergy status to penicillin: Secondary | ICD-10-CM | POA: Insufficient documentation

## 2017-04-22 DIAGNOSIS — C771 Secondary and unspecified malignant neoplasm of intrathoracic lymph nodes: Secondary | ICD-10-CM

## 2017-04-22 DIAGNOSIS — R5383 Other fatigue: Secondary | ICD-10-CM | POA: Insufficient documentation

## 2017-04-22 DIAGNOSIS — E7800 Pure hypercholesterolemia, unspecified: Secondary | ICD-10-CM | POA: Diagnosis not present

## 2017-04-22 DIAGNOSIS — R042 Hemoptysis: Secondary | ICD-10-CM | POA: Insufficient documentation

## 2017-04-22 DIAGNOSIS — Z8051 Family history of malignant neoplasm of kidney: Secondary | ICD-10-CM | POA: Insufficient documentation

## 2017-04-22 LAB — COMPREHENSIVE METABOLIC PANEL
ALK PHOS: 83 U/L (ref 38–126)
ALT: 6 U/L — ABNORMAL LOW (ref 14–54)
ANION GAP: 9 (ref 5–15)
AST: 14 U/L — ABNORMAL LOW (ref 15–41)
Albumin: 3.4 g/dL — ABNORMAL LOW (ref 3.5–5.0)
BUN: 22 mg/dL — ABNORMAL HIGH (ref 6–20)
CALCIUM: 9.1 mg/dL (ref 8.9–10.3)
CO2: 25 mmol/L (ref 22–32)
Chloride: 94 mmol/L — ABNORMAL LOW (ref 101–111)
Creatinine, Ser: 1.49 mg/dL — ABNORMAL HIGH (ref 0.44–1.00)
GFR calc non Af Amer: 31 mL/min — ABNORMAL LOW (ref >60)
GFR, EST AFRICAN AMERICAN: 35 mL/min — AB (ref >60)
Glucose, Bld: 142 mg/dL — ABNORMAL HIGH (ref 65–99)
Potassium: 4.3 mmol/L (ref 3.5–5.1)
SODIUM: 128 mmol/L — AB (ref 135–145)
Total Bilirubin: 0.7 mg/dL (ref 0.3–1.2)
Total Protein: 6.9 g/dL (ref 6.5–8.1)

## 2017-04-22 LAB — CBC WITH DIFFERENTIAL/PLATELET
Basophils Absolute: 0.1 10*3/uL (ref 0–0.1)
Basophils Relative: 1 %
EOS ABS: 0.8 10*3/uL — AB (ref 0–0.7)
EOS PCT: 9 %
HCT: 33.2 % — ABNORMAL LOW (ref 35.0–47.0)
Hemoglobin: 11 g/dL — ABNORMAL LOW (ref 12.0–16.0)
Lymphocytes Relative: 4 %
Lymphs Abs: 0.4 10*3/uL — ABNORMAL LOW (ref 1.0–3.6)
MCH: 28.2 pg (ref 26.0–34.0)
MCHC: 33.2 g/dL (ref 32.0–36.0)
MCV: 85 fL (ref 80.0–100.0)
MONO ABS: 0.9 10*3/uL (ref 0.2–0.9)
MONOS PCT: 11 %
Neutro Abs: 6.3 10*3/uL (ref 1.4–6.5)
Neutrophils Relative %: 75 %
PLATELETS: 259 10*3/uL (ref 150–440)
RBC: 3.91 MIL/uL (ref 3.80–5.20)
RDW: 15.6 % — ABNORMAL HIGH (ref 11.5–14.5)
WBC: 8.4 10*3/uL (ref 3.6–11.0)

## 2017-04-22 MED ORDER — SODIUM CHLORIDE 0.9 % IV SOLN
Freq: Once | INTRAVENOUS | Status: AC
  Administered 2017-04-22: 15:00:00 via INTRAVENOUS
  Filled 2017-04-22: qty 1000

## 2017-04-22 MED ORDER — SODIUM CHLORIDE 0.9 % IV SOLN
Freq: Once | INTRAVENOUS | Status: AC
  Administered 2017-04-22: 15:00:00 via INTRAVENOUS
  Filled 2017-04-22: qty 4

## 2017-04-22 MED ORDER — PACLITAXEL PROTEIN-BOUND CHEMO INJECTION 100 MG
80.0000 mg/m2 | Freq: Once | INTRAVENOUS | Status: AC
  Administered 2017-04-22: 125 mg via INTRAVENOUS
  Filled 2017-04-22: qty 25

## 2017-04-22 NOTE — Assessment & Plan Note (Addendum)
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 On palliative [place].  CT June 26th 2018- complete collapse of the left lung; pleural effusion; increasing lung nodules/suggestive progressive disease.  # Discussed regarding the subsequent line of treatment including chemotherapy with Abraxane. Patient wants to try and see if her symptoms of fatigue and shortness of breath improved on current therapy.   Discussed the potential side effects of Abraxane including but not limited to fatigue drop in blood counts neuropathy. Patient understands the treatments are palliative; not curative. Patient family aware a significant risk of side effects from chemotherapy. If she has significant side effects-hospice would be very appropriate.  # Hypothyroidism- secondary to [place]; on Synthroid 88 g. We'll check thyroid profile at next visit  # Labs reviewed; proceed with chemotherapy today-cycle #1 day 1.  # Follow-up one week labs chemotherapy; follow-up with me in 3 weeks labs chemotherapy.   Addendum:  Pt needs- the following for symptom management.  [facility] Evaluation with    A. PT    B. Skilled nursing    C. Nursing Assistant  #  hospital bed due to SOB and inability to lay flat./  wheelchair due to decreased ability to ambulate and SOB.   # Will re-new order for oxygen.   Dr.Jasaiah Karwowski

## 2017-04-22 NOTE — Progress Notes (Signed)
[place] NOTE  Patient Care Team: Einar Pheasant, MD as PCP - General (Internal Medicine) Wellington Hampshire, MD as Consulting Physician (Cardiology)  CHIEF COMPLAINTS/PURPOSE OF CONSULTATION: Lung cancer  #  Oncology History   # SEP 2017- SQUAMOUS CELL CA [s/p Bronch Dr.S Khan];PET - LEFT UPPER LOBE MASS- left upper lung collpase; mediastinal LN; Contralateral Right nodules [x3];STAGE IV [contralateral lung nodules]  # PDL-1- 90% [Keytruda]; START NOV 2017; June 2018- PROGRESSION  # July 6th 2018- ABRAXANE   # IATROGENIC HYPOTHYROIDISM    # Hx of smoking/ COPD/ CKD [creat ~ 1.5]     Primary cancer of left upper lobe of lung ([place])     HISTORY OF PRESENTING ILLNESS:  Alice Delgado 83 y.o.  female with squamous cell lung cancer stage IV Noted to have progression on first line therapy with Beryle Flock- is here to proceed with second line of chemotherapy.  Overall patient continues to feel poorly, with continued shortness of breath. She did not notice any significant improvement in her respiratory status- Since thoracentesis.   No hemoptysis. Otherwise no fevers or chills. "stays cold".  Denies any bleeding or falls. She continues to take half of pain pill approximately 3 times a day for the left chest wall pain. She feels fatigued. She is hoping to see if the chemotherapy improves her symptoms.  ROS: A complete 10 point review of system is done which is negative except mentioned above in history of present illness  MEDICAL HISTORY:  Past Medical History:  Diagnosis Date  . Asthma   . Diverticulosis   . GERD (gastroesophageal reflux disease)   . Hiatal hernia   . Hypercholesterolemia   . Hyperglycemia   . Hypertension   . Hypothyroidism   . Osteoarthritis    lumbar disc dz, cervical disc dz, hands  . Osteoporosis    intolerance to fosamax, evista and miacalcin  . Peptic ulcer disease   . Pernicious anemia   . Vitamin D deficiency      SURGICAL HISTORY: Past Surgical History:  Procedure Laterality Date  . ABDOMINAL HYSTERECTOMY     with benign ovarian tumor removed.    Marland Kitchen FLEXIBLE BRONCHOSCOPY N/A 06/16/2016   Procedure: FLEXIBLE BRONCHOSCOPY;  Surgeon: Allyne Gee, MD;  Location: [facility] ORS;  Service: Pulmonary;  Laterality: N/A;  . LUMBAR FUSION      SOCIAL HISTORY:Retired Network engineer. Lives in [place]- 2 daughters; quit smoking in 1985. No alcohol. Social History   Social History  . Marital status: Widowed    Spouse name: N/A  . Number of children: 2  . Years of education: N/A   Occupational History  . Not on file.   Social History Main Topics  . Smoking status: Former Smoker    Years: 15.00    Types: Cigarettes  . Smokeless tobacco: Never Used  . Alcohol use No  . Drug use: No  . Sexual activity: Not on file   Other Topics Concern  . Not on file   Social History Narrative  . No narrative on file    FAMILY HISTORY: Family History  Problem Relation Age of Onset  . Heart disease Father        myocardial infarction  . Heart attack Father   . Cancer Mother        cancer of lymph nodes and kidney  . Breast cancer Sister   . Colon cancer Neg Hx     ALLERGIES:  is allergic to penicillins; augmentin [amoxicillin-pot  clavulanate]; evista [raloxifene]; fosamax [alendronate sodium]; lipitor [atorvastatin]; miralax [polyethylene glycol]; and zetia [ezetimibe].  MEDICATIONS:  Current Outpatient Prescriptions  Medication Sig Dispense Refill  . albuterol (PROAIR HFA) 108 (90 Base) MCG/ACT inhaler Inhale 2 puffs into the lungs every 6 (six) hours as needed for wheezing or shortness of breath. 18 g 5  . albuterol (PROVENTIL) (2.5 MG/3ML) 0.083% nebulizer solution Take 3 mLs (2.5 mg total) by nebulization 2 (two) times daily. DX: EMPHYSEMA DX CODE: J43.8 75 mL 12  . apixaban (ELIQUIS) 5 MG TABS tablet Take 1 tablet (5 mg total) by mouth 2 (two) times daily. 60 tablet 5  . BREO ELLIPTA 100-25 MCG/INH AEPB  INHALE 1 PUFF BY MOUTH DAILY 30 each 0  . cyanocobalamin (,VITAMIN B-12,) 1000 MCG/ML injection Inject 1 mL (1,000 mcg total) into the muscle every 30 (thirty) days. 10 mL 1  . HYDROcodone-acetaminophen (VICODIN) 5-325 mg TABS tablet One pill every night as needed. 60 tablet 0  . hydrOXYzine (ATARAX/VISTARIL) 25 MG tablet TAKE 1 TABLET(25 MG) BY MOUTH THREE TIMES DAILY AS NEEDED FOR ITCHING 60 tablet 0  . ipratropium-albuterol (DUONEB) 0.5-2.5 (3) MG/3ML SOLN Take 3 mLs by nebulization as needed.    Marland Kitchen levothyroxine (SYNTHROID, LEVOTHROID) 88 MCG tablet Take 1 tablet (88 mcg total) by mouth daily before breakfast. 30 tablet 4  . metoprolol tartrate (LOPRESSOR) 50 MG tablet TAKE 1 TABLET(50 MG) BY MOUTH TWICE DAILY 60 tablet 3  . montelukast (SINGULAIR) 10 MG tablet Take 1 tablet (10 mg total) by mouth at bedtime. 90 tablet 0  . nitroGLYCERIN (NITROSTAT) 0.4 MG SL tablet Place 1 tablet (0.4 mg total) under the tongue every 5 (five) minutes as needed for chest pain. 25 tablet 2  . nystatin cream (MYCOSTATIN) Apply 1 application topically 3 (three) times daily.  2  . NYSTATIN powder Apply 1 application topically 4 (four) times daily.  2  . ondansetron (ZOFRAN) 4 MG tablet TK 1 T PO BID PRF NAUSEA  1  . predniSONE (DELTASONE) 20 MG tablet Take 1 tablet (20 mg total) by mouth daily with breakfast. Once a day with food x 14 days. 14 tablet 0  . spironolactone (ALDACTONE) 25 MG tablet TAKE 1 TABLET BY MOUTH DAILY 30 tablet 0  . Tiotropium Bromide Monohydrate (SPIRIVA RESPIMAT) 2.5 MCG/ACT AERS Inhale 2 puffs into the lungs daily. 4 g 0  . triamcinolone ointment (KENALOG) 0.5 % Apply 1 application topically 2 (two) times daily. 30 g 0   No current facility-administered medications for this visit.       Marland Kitchen  PHYSICAL EXAMINATION: ECOG PERFORMANCE STATUS: 1 - Symptomatic but completely ambulatory  Vitals:   04/22/17 1407  BP: 119/74  Pulse: 63  Resp: 18  Temp: 98.7 F (37.1 C)   Filed Weights    04/22/17 1407  Weight: 126 lb 4 oz (57.3 kg)    GENERAL: Well-nourished well-developed; Alert, no distress and comfortable.  With daughters/ neice.She is in a wheelchair. She is on 4 L of oxygen. EYES: no pallor or icterus OROPHARYNX: no thrush or ulceration; good dentition  NECK: supple, no masses felt LYMPH:  no palpable lymphadenopathy in the cervical, axillary or inguinal regions LUNGS: Decreased breath sounds on the left side compared to the right.  HEART/CVS: irregular rhythm and no murmurs; No lower extremity edema ABDOMEN: abdomen soft, non-tender and normal bowel sounds Musculoskeletal:no cyanosis of digits and no clubbing  PSYCH: alert & oriented x 3 with fluent speech NEURO: no focal motor/sensory deficits Skin-  multiple ecchymosis. Otherwise no rash.  LABORATORY DATA:  I have reviewed the data as listed Lab Results  Component Value Date   WBC 8.4 04/22/2017   HGB 11.0 (L) 04/22/2017   HCT 33.2 (L) 04/22/2017   MCV 85.0 04/22/2017   PLT 259 04/22/2017    Recent Labs  03/11/17 1255 04/01/17 1405 04/22/17 1338  NA 129* 129* 128*  K 5.0 4.2 4.3  CL 94* 94* 94*  CO2 29 26 25   GLUCOSE 122* 114* 142*  BUN 31* 19 22*  CREATININE 1.74* 1.41* 1.49*  CALCIUM 9.5 9.2 9.1  GFRNONAA 26* 33* 31*  GFRAA 30* 38* 35*  PROT 7.4 7.2 6.9  ALBUMIN 4.1 3.8 3.4*  AST 43* 17 14*  ALT 37 8* 6*  ALKPHOS 75 87 83  BILITOT 0.6 0.5 0.7    RADIOGRAPHIC STUDIES: I have personally reviewed the radiological images as listed and agreed with the findings in the report. Dg Chest 2 View  Result Date: 04/11/2017 CLINICAL DATA:  Left side chest pain.  History of left lung cancer. EXAM: CHEST  2 VIEW COMPARISON:  02/25/2017 FINDINGS: Complete opacification of the left hemithorax. Nodular platelike density noted in the right upper lobe, stable. Nodular right perihilar density also again noted. Underlying COPD. Heart is difficult to visualize due to opacified left hemithorax. Aortic  calcifications. IMPRESSION: Bleed opacification of the left hemithorax. Nodular densities in the right upper lobe and right perihilar region are unchanged. COPD. Electronically Signed   By: Rolm Baptise M.D.   On: 04/11/2017 14:46   Ct Chest Wo Contrast  Result Date: 04/12/2017 CLINICAL DATA:  83 year old female with worsening shortness of breath with exertion and left-sided chest pain for the last week and a half. History of left-sided lung cancer diagnosed in August 2017 treated with radiation therapy completed in October 2017. Former smoker (quit 30 years ago). EXAM: CT CHEST WITHOUT CONTRAST TECHNIQUE: Multidetector CT imaging of the chest was performed following the standard protocol without IV contrast. COMPARISON:  Chest CT 01/31/2017. FINDINGS: Cardiovascular: Heart size is normal. Trace amount of pericardial fluid and/or thickening, unlikely to be of hemodynamic significance at this time. No associated pericardial calcification. There is aortic atherosclerosis, as well as atherosclerosis of the great vessels of the mediastinum and the coronary arteries, including calcified atherosclerotic plaque in the left main, left anterior descending and right coronary arteries. Calcifications of the mitral annulus. Mediastinum/Nodes: Multiple enlarged mediastinal lymph nodes, largest of which is a 2.4 cm short axis subcarinal lymph node (previously 2.3 cm). Low right paratracheal lymph node has markedly increased in size currently measuring 2.1 cm in short axis (previously 13 mm in short axis). In addition, there is a a high right paraesophageal lymph node which currently measures 13 mm in short axis (previously only 8 mm), in addition to other enlarging mediastinal lymph nodes. Esophagus is unremarkable in appearance. No axillary lymphadenopathy. Lungs/Pleura: There is now complete atelectasis of the entire left lung with a large pleural effusion. Previously noted left-sided masses are now obscured. Increased  number and size of numerous right-sided pulmonary nodules and masses, compatible with progressive disease. The largest of these is the lesion in the right upper lobe which currently measures 2.3 x 2.8 x 3.6 cm (axial image 40 of series 3 and coronal image 73 of series 5). The previously noted partially cavitary right lower lobe nodule is also slightly increased in size measuring 2.5 x 1.8 cm (axial image 77 of series 3). Several other smaller nodules have  significantly increased in size compared to the prior examination, most notable for a lesion in the right middle lobe (image 125 of series 3) which measures 11 x 7 mm, and a lesion in the medial aspect of the right upper lobe (image 73 of series 3) measuring 6 x 9 mm. Upper Abdomen: Aortic atherosclerosis. Musculoskeletal: Very subtle area of sclerosis in the lateral aspect of the right fifth rib (axial image 85 of series 3), which could represent a metastatic lesion. IMPRESSION: 1. Progressive widespread metastatic disease in the lungs and worsening mediastinal lymphadenopathy, as detailed above. Notably, there is now complete atelectasis of the left lung and a large malignant left pleural effusion. 2. Subtle areas sclerosis in the lateral aspect of the right fifth rib which may represent a metastatic lesion. 3. Aortic atherosclerosis, in addition to left main and 2 vessel coronary artery disease. Aortic Atherosclerosis (ICD10-I70.0). Electronically Signed   By: Vinnie Langton M.D.   On: 04/12/2017 10:22   Dg Chest Port 1 View  Result Date: 04/13/2017 CLINICAL DATA:  Status post left thoracentesis removing 850 cc EXAM: PORTABLE CHEST 1 VIEW COMPARISON:  04/11/2017 FINDINGS: Complete opacification persist of the left hemithorax compatible with left lung collapse/ consolidation and residual left effusion despite removing 850 cc. No pneumothorax. Cardiac and mediastinal contours are obscured. Right lung remains aerated. Persistent right upper lobe masslike  opacity. Atherosclerosis of aorta. Degenerative changes of the spine with an associated scoliosis. IMPRESSION: No pneumothorax following 850 cc left thoracentesis. Persistent complete opacification of the left hemithorax compatible with complete left lung collapse/ consolidation and residual left effusion. Electronically Signed   By: Jerilynn Mages.  Shick M.D.   On: 04/13/2017 12:02   US Thoracentesis Asp Pleural Space W/img Guide  Result Date: 04/13/2017 INDICATION: Lung cancer, large left effusion, shortness of breath EXAM: ULTRASOUND GUIDED LEFT THORACENTESIS MEDICATIONS: 1% lidocaine local COMPLICATIONS: None immediate. PROCEDURE: An ultrasound guided thoracentesis was thoroughly discussed with the patient and questions answered. The benefits, risks, alternatives and complications were also discussed. The patient understands and wishes to proceed with the procedure. Written consent was obtained. Ultrasound was performed to localize and mark an adequate pocket of fluid in the left chest. The area was then prepped and draped in the normal sterile fashion. 1% Lidocaine was used for local anesthesia. Under ultrasound guidance a 6 Fr Safe-T-Centesis catheter was introduced. Thoracentesis was performed. The catheter was removed and a dressing applied. FINDINGS: A total of approximately 850 cc of clear serosanguineous pleural fluid was removed. Sample was not sent for laboratory analysis IMPRESSION: Successful ultrasound guided left thoracentesis yielding 850 cc of pleural fluid. Electronically Signed   By: Jerilynn Mages.  Shick M.D.   On: 04/13/2017 12:08    ASSESSMENT & PLAN:   Primary cancer of left upper lobe of lung ([place])  # Squamous cell carcinoma of the left upper lobe with atelectasis left upper lung; and also contralateral lung nodules- stage IV lung cancer. On palliative [place].  CT June 26th 2018- complete collapse of the left lung; pleural effusion; increasing lung nodules/suggestive progressive disease.  # Discussed  regarding the subsequent line of treatment including chemotherapy with Abraxane. Patient wants to try and see if her symptoms of fatigue and shortness of breath improved on current therapy.   Discussed the potential side effects of Abraxane including but not limited to fatigue drop in blood counts neuropathy. Patient understands the treatments are palliative; not curative. Patient family aware a significant risk of side effects from chemotherapy. If she has significant  side effects-hospice would be very appropriate.  # Hypothyroidism- secondary to [place]; on Synthroid 88 g. We'll check thyroid profile at next visit  # Labs reviewed; proceed with chemotherapy today-cycle #1 day 1.  # Follow-up one week labs chemotherapy; follow-up with me in 3 weeks labs chemotherapy.      Cammie Sickle, MD 04/22/2017 5:49 PM

## 2017-04-22 NOTE — Progress Notes (Signed)
Patient here today for follow up.   

## 2017-04-27 ENCOUNTER — Encounter: Payer: Self-pay | Admitting: Internal Medicine

## 2017-04-28 ENCOUNTER — Other Ambulatory Visit: Payer: Self-pay | Admitting: Internal Medicine

## 2017-04-28 ENCOUNTER — Encounter: Payer: Self-pay | Admitting: *Deleted

## 2017-04-28 DIAGNOSIS — C3412 Malignant neoplasm of upper lobe, left bronchus or lung: Secondary | ICD-10-CM

## 2017-04-28 NOTE — Progress Notes (Signed)
Addendum-patient currently on 4L oxygen - sats remaining at 98% NS-4L. Pt symptomatic with dyspnea with exertion. requires w/c

## 2017-04-29 ENCOUNTER — Other Ambulatory Visit: Payer: Medicare Other

## 2017-04-29 ENCOUNTER — Ambulatory Visit: Payer: Medicare Other

## 2017-04-30 DIAGNOSIS — R05 Cough: Secondary | ICD-10-CM | POA: Diagnosis not present

## 2017-04-30 DIAGNOSIS — J449 Chronic obstructive pulmonary disease, unspecified: Secondary | ICD-10-CM | POA: Diagnosis not present

## 2017-05-02 ENCOUNTER — Inpatient Hospital Stay: Payer: Medicare Other

## 2017-05-02 ENCOUNTER — Other Ambulatory Visit: Payer: Self-pay | Admitting: *Deleted

## 2017-05-02 ENCOUNTER — Telehealth: Payer: Self-pay | Admitting: Internal Medicine

## 2017-05-02 ENCOUNTER — Telehealth: Payer: Self-pay | Admitting: *Deleted

## 2017-05-02 DIAGNOSIS — C3412 Malignant neoplasm of upper lobe, left bronchus or lung: Secondary | ICD-10-CM

## 2017-05-02 DIAGNOSIS — R110 Nausea: Secondary | ICD-10-CM

## 2017-05-02 DIAGNOSIS — T451X5A Adverse effect of antineoplastic and immunosuppressive drugs, initial encounter: Secondary | ICD-10-CM

## 2017-05-02 MED ORDER — PROCHLORPERAZINE MALEATE 10 MG PO TABS: 10.0000 mg | ORAL_TABLET | Freq: Four times a day (QID) | ORAL | 3 refills | Status: AC | PRN

## 2017-05-02 NOTE — Telephone Encounter (Addendum)
Pt's daughter Cecille Rubin contacted the cancer center. Spoke with RN. Pt feels very weak and would like to cnl her chemotherapy apts today.  Last took her zofran 4 mg at 9 am. Pt still reports nausea. Asking if patient can take an additional tablet of zofran to control nausea. Pt has not vomited.  She will take another Zofran and will call our office back if nausea persist. New rx for compazine faxed to pt's pharmacy. Pt's chemo will be r/s to 05/03/17 per pt's request. Scheduling team made aware and will r/s pt's apt and contact daughter with new apt.

## 2017-05-02 NOTE — Telephone Encounter (Signed)
Alice Delgado [facility]) and she wanted me to ask for the following items.  1. Request Home Health Evaluation with    A. PT    B. [name]

## 2017-05-03 ENCOUNTER — Inpatient Hospital Stay: Payer: Medicare Other

## 2017-05-03 ENCOUNTER — Encounter: Payer: Self-pay | Admitting: Internal Medicine

## 2017-05-03 NOTE — Telephone Encounter (Signed)
Please advise for referrals to home health. thanks

## 2017-05-03 NOTE — Telephone Encounter (Signed)
Noted.  She is seeing oncology.  Per note, it appears they have received orders from oncology.  Let me know if any problems.

## 2017-05-03 NOTE — Telephone Encounter (Signed)
FYI Adding Naomi from [facility] has received orders for Home Health from pt's oncology provider.

## 2017-05-03 NOTE — Telephone Encounter (Signed)
L/m to call office need more information

## 2017-05-04 ENCOUNTER — Inpatient Hospital Stay (HOSPITAL_BASED_OUTPATIENT_CLINIC_OR_DEPARTMENT_OTHER): Payer: Medicare Other | Admitting: Internal Medicine

## 2017-05-04 ENCOUNTER — Inpatient Hospital Stay: Payer: Medicare Other

## 2017-05-04 ENCOUNTER — Telehealth: Payer: Self-pay | Admitting: *Deleted

## 2017-05-04 VITALS — BP 112/60 | HR 58 | Temp 97.8°F | Resp 20

## 2017-05-04 DIAGNOSIS — C771 Secondary and unspecified malignant neoplasm of intrathoracic lymph nodes: Secondary | ICD-10-CM | POA: Diagnosis not present

## 2017-05-04 DIAGNOSIS — Z87891 Personal history of nicotine dependence: Secondary | ICD-10-CM

## 2017-05-04 DIAGNOSIS — Z79899 Other long term (current) drug therapy: Secondary | ICD-10-CM | POA: Diagnosis not present

## 2017-05-04 DIAGNOSIS — Z9225 Personal history of immunosupression therapy: Secondary | ICD-10-CM | POA: Diagnosis not present

## 2017-05-04 DIAGNOSIS — E032 Hypothyroidism due to medicaments and other exogenous substances: Secondary | ICD-10-CM

## 2017-05-04 DIAGNOSIS — Z800 Family history of malignant neoplasm of digestive organs: Secondary | ICD-10-CM

## 2017-05-04 DIAGNOSIS — M1990 Unspecified osteoarthritis, unspecified site: Secondary | ICD-10-CM | POA: Diagnosis not present

## 2017-05-04 DIAGNOSIS — Z9981 Dependence on supplemental oxygen: Secondary | ICD-10-CM | POA: Diagnosis not present

## 2017-05-04 DIAGNOSIS — E559 Vitamin D deficiency, unspecified: Secondary | ICD-10-CM | POA: Diagnosis not present

## 2017-05-04 DIAGNOSIS — Z7952 Long term (current) use of systemic steroids: Secondary | ICD-10-CM | POA: Diagnosis not present

## 2017-05-04 DIAGNOSIS — I4891 Unspecified atrial fibrillation: Secondary | ICD-10-CM | POA: Diagnosis not present

## 2017-05-04 DIAGNOSIS — R5383 Other fatigue: Secondary | ICD-10-CM | POA: Diagnosis not present

## 2017-05-04 DIAGNOSIS — K219 Gastro-esophageal reflux disease without esophagitis: Secondary | ICD-10-CM

## 2017-05-04 DIAGNOSIS — C3412 Malignant neoplasm of upper lobe, left bronchus or lung: Secondary | ICD-10-CM

## 2017-05-04 DIAGNOSIS — I129 Hypertensive chronic kidney disease with stage 1 through stage 4 chronic kidney disease, or unspecified chronic kidney disease: Secondary | ICD-10-CM | POA: Diagnosis not present

## 2017-05-04 DIAGNOSIS — M810 Age-related osteoporosis without current pathological fracture: Secondary | ICD-10-CM

## 2017-05-04 DIAGNOSIS — R0602 Shortness of breath: Secondary | ICD-10-CM

## 2017-05-04 DIAGNOSIS — R042 Hemoptysis: Secondary | ICD-10-CM

## 2017-05-04 DIAGNOSIS — J449 Chronic obstructive pulmonary disease, unspecified: Secondary | ICD-10-CM

## 2017-05-04 DIAGNOSIS — R5381 Other malaise: Secondary | ICD-10-CM

## 2017-05-04 DIAGNOSIS — Z8051 Family history of malignant neoplasm of kidney: Secondary | ICD-10-CM

## 2017-05-04 DIAGNOSIS — E7800 Pure hypercholesterolemia, unspecified: Secondary | ICD-10-CM | POA: Diagnosis not present

## 2017-05-04 DIAGNOSIS — Z7901 Long term (current) use of anticoagulants: Secondary | ICD-10-CM | POA: Diagnosis not present

## 2017-05-04 DIAGNOSIS — Z880 Allergy status to penicillin: Secondary | ICD-10-CM

## 2017-05-04 DIAGNOSIS — Z803 Family history of malignant neoplasm of breast: Secondary | ICD-10-CM

## 2017-05-04 DIAGNOSIS — R110 Nausea: Secondary | ICD-10-CM | POA: Diagnosis not present

## 2017-05-04 DIAGNOSIS — N189 Chronic kidney disease, unspecified: Secondary | ICD-10-CM

## 2017-05-04 DIAGNOSIS — Z5111 Encounter for antineoplastic chemotherapy: Secondary | ICD-10-CM | POA: Diagnosis not present

## 2017-05-04 LAB — BASIC METABOLIC PANEL
ANION GAP: 8 (ref 5–15)
BUN: 19 mg/dL (ref 6–20)
CHLORIDE: 97 mmol/L — AB (ref 101–111)
CO2: 23 mmol/L (ref 22–32)
Calcium: 9.2 mg/dL (ref 8.9–10.3)
Creatinine, Ser: 1.37 mg/dL — ABNORMAL HIGH (ref 0.44–1.00)
GFR calc non Af Amer: 34 mL/min — ABNORMAL LOW (ref >60)
GFR, EST AFRICAN AMERICAN: 39 mL/min — AB (ref >60)
GLUCOSE: 137 mg/dL — AB (ref 65–99)
Potassium: 4.1 mmol/L (ref 3.5–5.1)
Sodium: 128 mmol/L — ABNORMAL LOW (ref 135–145)

## 2017-05-04 LAB — CBC WITH DIFFERENTIAL/PLATELET
BASOS ABS: 0 10*3/uL (ref 0–0.1)
Basophils Relative: 1 %
EOS PCT: 6 %
Eosinophils Absolute: 0.3 10*3/uL (ref 0–0.7)
HCT: 31.6 % — ABNORMAL LOW (ref 35.0–47.0)
HEMOGLOBIN: 10.6 g/dL — AB (ref 12.0–16.0)
LYMPHS PCT: 7 %
Lymphs Abs: 0.4 10*3/uL — ABNORMAL LOW (ref 1.0–3.6)
MCH: 27.5 pg (ref 26.0–34.0)
MCHC: 33.4 g/dL (ref 32.0–36.0)
MCV: 82.4 fL (ref 80.0–100.0)
Monocytes Absolute: 0.6 10*3/uL (ref 0.2–0.9)
Monocytes Relative: 12 %
NEUTROS ABS: 3.7 10*3/uL (ref 1.4–6.5)
NEUTROS PCT: 74 %
PLATELETS: 286 10*3/uL (ref 150–440)
RBC: 3.84 MIL/uL (ref 3.80–5.20)
RDW: 14.5 % (ref 11.5–14.5)
WBC: 5 10*3/uL (ref 3.6–11.0)

## 2017-05-04 MED ORDER — BENZONATATE 200 MG PO CAPS: 200.0000 mg | ORAL_CAPSULE | Freq: Three times a day (TID) | ORAL | 3 refills | Status: AC | PRN

## 2017-05-04 NOTE — Progress Notes (Signed)
[place] NOTE  Patient Care Team: Einar Pheasant, MD as PCP - General (Internal Medicine) Wellington Hampshire, MD as Consulting Physician (Cardiology)  CHIEF COMPLAINTS/PURPOSE OF CONSULTATION: Lung cancer  #  Oncology History   # SEP 2017- SQUAMOUS CELL CA [s/p Bronch Dr.S Khan];PET - LEFT UPPER LOBE MASS- left upper lung collpase; mediastinal LN; Contralateral Right nodules [x3];STAGE IV [contralateral lung nodules]  # PDL-1- 90% [Keytruda]; START NOV 2017; June 2018- PROGRESSION  # July 6th 2018- ABRAXANE   # IATROGENIC HYPOTHYROIDISM    # Hx of smoking/ COPD/ CKD [creat ~ 1.5]     Primary cancer of left upper lobe of lung ([place])     HISTORY OF PRESENTING ILLNESS:  Alice Delgado 83 y.o.  female with squamous cell lung cancer stage IVCurrently on second line therapy with Abraxane is here for follow-up.  Patient underwent cycle #1 day 1 Abraxane approximately 10 days ago. She has been unable to do cycle #1 day 8 chemotherapy because of persistent nausea/feeling poorly.  Patient continues to feel poorly; continues to have shortness of breath. Continues to need oxygen 4 L for the clock.    Daughter has noticed blood sputum/and coughing. This is intermittent. Continues to feel fatigued. She has ongoing nausea some respite after antiemetics. She goes around with a walker  ROS: A complete 10 point review of system is done which is negative except mentioned above in history of present illness  MEDICAL HISTORY:  Past Medical History:  Diagnosis Date  . Asthma   . COPD (chronic obstructive pulmonary disease) ([place])   . Diverticulosis   . GERD (gastroesophageal reflux disease)   . Hiatal hernia   . Hypercholesterolemia   . Hyperglycemia   . Hypertension   . Hypothyroidism   . Osteoarthritis    lumbar disc dz, cervical disc dz, hands  . Osteoporosis    intolerance to fosamax, evista and miacalcin  . Peptic ulcer disease   . Pernicious anemia    . Shortness of breath   . Vitamin D deficiency     SURGICAL HISTORY: Past Surgical History:  Procedure Laterality Date  . ABDOMINAL HYSTERECTOMY     with benign ovarian tumor removed.    Marland Kitchen FLEXIBLE BRONCHOSCOPY N/A 06/16/2016   Procedure: FLEXIBLE BRONCHOSCOPY;  Surgeon: Allyne Gee, MD;  Location: [facility] ORS;  Service: Pulmonary;  Laterality: N/A;  . LUMBAR FUSION      SOCIAL HISTORY:Retired Network engineer. Lives in [place]- 2 daughters; quit smoking in 1985. No alcohol. Social History   Social History  . Marital status: Widowed    Spouse name: N/A  . Number of children: 2  . Years of education: N/A   Occupational History  . Not on file.   Social History Main Topics  . Smoking status: Former Smoker    Years: 15.00    Types: Cigarettes  . Smokeless tobacco: Never Used  . Alcohol use No  . Drug use: No  . Sexual activity: Not on file   Other Topics Concern  . Not on file   Social History Narrative  . No narrative on file    FAMILY HISTORY: Family History  Problem Relation Age of Onset  . Heart disease Father        myocardial infarction  . Heart attack Father   . Cancer Mother        cancer of lymph nodes and kidney  . Breast cancer Sister   . Colon cancer Neg Hx  ALLERGIES:  is allergic to penicillins; augmentin [amoxicillin-pot clavulanate]; evista [raloxifene]; fosamax [alendronate sodium]; lipitor [atorvastatin]; miralax [polyethylene glycol]; and zetia [ezetimibe].  MEDICATIONS:  Current Outpatient Prescriptions  Medication Sig Dispense Refill  . albuterol (PROAIR HFA) 108 (90 Base) MCG/ACT inhaler Inhale 2 puffs into the lungs every 6 (six) hours as needed for wheezing or shortness of breath. 18 g 5  . albuterol (PROVENTIL) (2.5 MG/3ML) 0.083% nebulizer solution Take 3 mLs (2.5 mg total) by nebulization 2 (two) times daily. DX: EMPHYSEMA DX CODE: J43.8 75 mL 12  . apixaban (ELIQUIS) 5 MG TABS tablet Take 1 tablet (5 mg total) by mouth 2 (two) times  daily. 60 tablet 5  . BREO ELLIPTA 100-25 MCG/INH AEPB INHALE 1 PUFF BY MOUTH DAILY 30 each 0  . cyanocobalamin (,VITAMIN B-12,) 1000 MCG/ML injection Inject 1 mL (1,000 mcg total) into the muscle every 30 (thirty) days. 10 mL 1  . HYDROcodone-acetaminophen (VICODIN) 5-325 mg TABS tablet One pill every night as needed. 60 tablet 0  . hydrOXYzine (ATARAX/VISTARIL) 25 MG tablet TAKE 1 TABLET(25 MG) BY MOUTH THREE TIMES DAILY AS NEEDED FOR ITCHING 60 tablet 0  . ipratropium-albuterol (DUONEB) 0.5-2.5 (3) MG/3ML SOLN Take 3 mLs by nebulization as needed.    Marland Kitchen levothyroxine (SYNTHROID, LEVOTHROID) 88 MCG tablet Take 1 tablet (88 mcg total) by mouth daily before breakfast. 30 tablet 4  . metoprolol tartrate (LOPRESSOR) 50 MG tablet TAKE 1 TABLET(50 MG) BY MOUTH TWICE DAILY 60 tablet 3  . montelukast (SINGULAIR) 10 MG tablet Take 1 tablet (10 mg total) by mouth at bedtime. (Patient taking differently: Take 10 mg by mouth every morning. ) 90 tablet 0  . ondansetron (ZOFRAN) 4 MG tablet TK 1 T PO BID PRF NAUSEA  1  . spironolactone (ALDACTONE) 25 MG tablet TAKE 1 TABLET BY MOUTH DAILY 30 tablet 0  . benzonatate (TESSALON) 200 MG capsule Take 1 capsule (200 mg total) by mouth 3 (three) times daily as needed for cough. 90 capsule 3  . nitroGLYCERIN (NITROSTAT) 0.4 MG SL tablet Place 1 tablet (0.4 mg total) under the tongue every 5 (five) minutes as needed for chest pain. (Patient not taking: Reported on 05/04/2017) 25 tablet 2  . nystatin cream (MYCOSTATIN) Apply 1 application topically 3 (three) times daily.  2  . NYSTATIN powder Apply 1 application topically 4 (four) times daily.  2  . prochlorperazine (COMPAZINE) 10 MG tablet Take 1 tablet (10 mg total) by mouth every 6 (six) hours as needed for nausea or vomiting. (Patient not taking: Reported on 05/04/2017) 30 tablet 3  . Tiotropium Bromide Monohydrate (SPIRIVA RESPIMAT) 2.5 MCG/ACT AERS Inhale 2 puffs into the lungs daily. (Patient not taking: Reported on  05/04/2017) 4 g 0  . triamcinolone ointment (KENALOG) 0.5 % APPLY EXTERNALLY TO THE AFFECTED AREA TWICE DAILY (Patient not taking: Reported on 05/04/2017) 30 g 0   No current facility-administered medications for this visit.       Marland Kitchen  PHYSICAL EXAMINATION: ECOG PERFORMANCE STATUS: 1 - Symptomatic but completely ambulatory  Vitals:   05/04/17 1345  BP: 112/60  Pulse: (!) 58  Resp: 20  Temp: 97.8 F (36.6 C)   There were no vitals filed for this visit.  GENERAL: Well-nourished well-developed; Alert, no distress and comfortable.  With daughters/ neice.She is in a wheelchair. She is on 4 L of oxygen. EYES: no pallor or icterus OROPHARYNX: no thrush or ulceration; good dentition  NECK: supple, no masses felt LYMPH:  no palpable lymphadenopathy  in the cervical, axillary or inguinal regions LUNGS: Decreased breath sounds on the left side compared to the right.  HEART/CVS: irregular rhythm and no murmurs; No lower extremity edema ABDOMEN: abdomen soft, non-tender and normal bowel sounds Musculoskeletal:no cyanosis of digits and no clubbing  PSYCH: alert & oriented x 3 with fluent speech NEURO: no focal motor/sensory deficits Skin- multiple ecchymosis. Otherwise no rash.  LABORATORY DATA:  I have reviewed the data as listed Lab Results  Component Value Date   WBC 5.0 05/04/2017   HGB 10.6 (L) 05/04/2017   HCT 31.6 (L) 05/04/2017   MCV 82.4 05/04/2017   PLT 286 05/04/2017    Recent Labs  03/11/17 1255 04/01/17 1405 04/22/17 1338 05/04/17 1315  NA 129* 129* 128* 128*  K 5.0 4.2 4.3 4.1  CL 94* 94* 94* 97*  CO2 29 26 25 23   GLUCOSE 122* 114* 142* 137*  BUN 31* 19 22* 19  CREATININE 1.74* 1.41* 1.49* 1.37*  CALCIUM 9.5 9.2 9.1 9.2  GFRNONAA 26* 33* 31* 34*  GFRAA 30* 38* 35* 39*  PROT 7.4 7.2 6.9  --   ALBUMIN 4.1 3.8 3.4*  --   AST 43* 17 14*  --   ALT 37 8* 6*  --   ALKPHOS 75 87 83  --   BILITOT 0.6 0.5 0.7  --     RADIOGRAPHIC STUDIES: I have personally  reviewed the radiological images as listed and agreed with the findings in the report. Dg Chest 2 View  Result Date: 04/11/2017 CLINICAL DATA:  Left side chest pain.  History of left lung cancer. EXAM: CHEST  2 VIEW COMPARISON:  02/25/2017 FINDINGS: Complete opacification of the left hemithorax. Nodular platelike density noted in the right upper lobe, stable. Nodular right perihilar density also again noted. Underlying COPD. Heart is difficult to visualize due to opacified left hemithorax. Aortic calcifications. IMPRESSION: Bleed opacification of the left hemithorax. Nodular densities in the right upper lobe and right perihilar region are unchanged. COPD. Electronically Signed   By: Rolm Baptise M.D.   On: 04/11/2017 14:46   Ct Chest Wo Contrast  Result Date: 04/12/2017 CLINICAL DATA:  83 year old female with worsening shortness of breath with exertion and left-sided chest pain for the last week and a half. History of left-sided lung cancer diagnosed in August 2017 treated with radiation therapy completed in October 2017. Former smoker (quit 30 years ago). EXAM: CT CHEST WITHOUT CONTRAST TECHNIQUE: Multidetector CT imaging of the chest was performed following the standard protocol without IV contrast. COMPARISON:  Chest CT 01/31/2017. FINDINGS: Cardiovascular: Heart size is normal. Trace amount of pericardial fluid and/or thickening, unlikely to be of hemodynamic significance at this time. No associated pericardial calcification. There is aortic atherosclerosis, as well as atherosclerosis of the great vessels of the mediastinum and the coronary arteries, including calcified atherosclerotic plaque in the left main, left anterior descending and right coronary arteries. Calcifications of the mitral annulus. Mediastinum/Nodes: Multiple enlarged mediastinal lymph nodes, largest of which is a 2.4 cm short axis subcarinal lymph node (previously 2.3 cm). Low right paratracheal lymph node has markedly increased in  size currently measuring 2.1 cm in short axis (previously 13 mm in short axis). In addition, there is a a high right paraesophageal lymph node which currently measures 13 mm in short axis (previously only 8 mm), in addition to other enlarging mediastinal lymph nodes. Esophagus is unremarkable in appearance. No axillary lymphadenopathy. Lungs/Pleura: There is now complete atelectasis of the entire left lung with  a large pleural effusion. Previously noted left-sided masses are now obscured. Increased number and size of numerous right-sided pulmonary nodules and masses, compatible with progressive disease. The largest of these is the lesion in the right upper lobe which currently measures 2.3 x 2.8 x 3.6 cm (axial image 40 of series 3 and coronal image 73 of series 5). The previously noted partially cavitary right lower lobe nodule is also slightly increased in size measuring 2.5 x 1.8 cm (axial image 77 of series 3). Several other smaller nodules have significantly increased in size compared to the prior examination, most notable for a lesion in the right middle lobe (image 125 of series 3) which measures 11 x 7 mm, and a lesion in the medial aspect of the right upper lobe (image 73 of series 3) measuring 6 x 9 mm. Upper Abdomen: Aortic atherosclerosis. Musculoskeletal: Very subtle area of sclerosis in the lateral aspect of the right fifth rib (axial image 85 of series 3), which could represent a metastatic lesion. IMPRESSION: 1. Progressive widespread metastatic disease in the lungs and worsening mediastinal lymphadenopathy, as detailed above. Notably, there is now complete atelectasis of the left lung and a large malignant left pleural effusion. 2. Subtle areas sclerosis in the lateral aspect of the right fifth rib which may represent a metastatic lesion. 3. Aortic atherosclerosis, in addition to left main and 2 vessel coronary artery disease. Aortic Atherosclerosis (ICD10-I70.0). Electronically Signed   By: Vinnie Langton M.D.   On: 04/12/2017 10:22   Dg Chest Port 1 View  Result Date: 04/13/2017 CLINICAL DATA:  Status post left thoracentesis removing 850 cc EXAM: PORTABLE CHEST 1 VIEW COMPARISON:  04/11/2017 FINDINGS: Complete opacification persist of the left hemithorax compatible with left lung collapse/ consolidation and residual left effusion despite removing 850 cc. No pneumothorax. Cardiac and mediastinal contours are obscured. Right lung remains aerated. Persistent right upper lobe masslike opacity. Atherosclerosis of aorta. Degenerative changes of the spine with an associated scoliosis. IMPRESSION: No pneumothorax following 850 cc left thoracentesis. Persistent complete opacification of the left hemithorax compatible with complete left lung collapse/ consolidation and residual left effusion. Electronically Signed   By: Jerilynn Mages.  Shick M.D.   On: 04/13/2017 12:02   US Thoracentesis Asp Pleural Space W/img Guide  Result Date: 04/13/2017 INDICATION: Lung cancer, large left effusion, shortness of breath EXAM: ULTRASOUND GUIDED LEFT THORACENTESIS MEDICATIONS: 1% lidocaine local COMPLICATIONS: None immediate. PROCEDURE: An ultrasound guided thoracentesis was thoroughly discussed with the patient and questions answered. The benefits, risks, alternatives and complications were also discussed. The patient understands and wishes to proceed with the procedure. Written consent was obtained. Ultrasound was performed to localize and mark an adequate pocket of fluid in the left chest. The area was then prepped and draped in the normal sterile fashion. 1% Lidocaine was used for local anesthesia. Under ultrasound guidance a 6 Fr Safe-T-Centesis catheter was introduced. Thoracentesis was performed. The catheter was removed and a dressing applied. FINDINGS: A total of approximately 850 cc of clear serosanguineous pleural fluid was removed. Sample was not sent for laboratory analysis IMPRESSION: Successful ultrasound guided left  thoracentesis yielding 850 cc of pleural fluid. Electronically Signed   By: Jerilynn Mages.  Shick M.D.   On: 04/13/2017 12:08    ASSESSMENT & PLAN:   Primary cancer of left upper lobe of lung ([place])  # Squamous cell carcinoma of the left upper lobe with atelectasis left upper lung; and also contralateral lung nodules- stage IV lung cancer; currently on second line chemo  abraxane cycle #1; day #1 on 7/12th.   # At a long discussion the patient regarding conditioning chemotherapy versus hospice. Discussed the pros and cons- after lengthy discussion patient again interested in pursuing chemotherapy option.   # Chronic nausea- likely secondary to malignancy versus chemotherapy continue antiemetics.  #  Intermittent hemoptysis- likely secondary to underlying malignancy/Eliquis. Recommend cutting down the dose of Eliquis 2.5 mg once a day.  # Hypothyroidism- secondary to [place]; on Synthroid 88 g.  awaiting on thyroid profile from today. Patient will likely need an adjustment based on TSH levels.   # A. Fib-chronic rate control. On anti-coagulation. See discussion above.   #  Follow-up-chemotherapy tomorrow; no labs; follow-up in approximately 2 weeks labs chemotherapy.   Addendum: TSH- within normal limits; continue the current dose of synthroid.   Addendum:  Pt needs- the following for symptom management.  # transport chair- pt unable to push herself; needs care giver assistance.  # "overlay gel mattress" to avoid bed sores.  # Oxygen face tent- to improve shortness of breath Dr.Fontaine Hehl     Cammie Sickle, MD 05/06/2017 4:04 PM

## 2017-05-04 NOTE — Assessment & Plan Note (Addendum)
#   Squamous cell carcinoma of the left upper lobe with atelectasis left upper lung; and also contralateral lung nodules- stage IV lung cancer; currently on second line chemo abraxane cycle #1; day #1 on 7/12th.   # At a long discussion the patient regarding conditioning chemotherapy versus hospice. Discussed the pros and cons- after lengthy discussion patient again interested in pursuing chemotherapy option.   # Chronic nausea- likely secondary to malignancy versus chemotherapy continue antiemetics.  #  Intermittent hemoptysis- likely secondary to underlying malignancy/Eliquis. Recommend cutting down the dose of Eliquis 2.5 mg once a day.  # Hypothyroidism- secondary to [place]; on Synthroid 88 g. awaiting on thyroid profile from today. Patient will likely need an adjustment based on TSH levels.   # A. Fib-chronic rate control. On anti-coagulation. See discussion above.   #  Follow-up-chemotherapy tomorrow; no labs; follow-up in approximately 2 weeks labs chemotherapy.   Addendum: TSH- within normal limits; continue the current dose of synthroid.   Addendum:  Pt needs- the following for symptom management.  # transport chair- pt unable to push herself; needs care giver assistance.  # "overlay gel mattress" to avoid bed sores.  # Oxygen face tent- to improve shortness of breath Dr.Brahmanday

## 2017-05-04 NOTE — Telephone Encounter (Signed)
Spoke with daughter Cecille Rubin to respond to [place]. Pt coughing up dark brown sputum. C/o sinus drip. C/o nausea with no relief of antiemetics. Has had to r/s her chemotherapy apts multiple times due to fatigue.  RN spoke with Dr. Rogue Bussing- md agreed to see pt at 1:30 pm. With labs - cbc, metb, thyroid panel. - labs entered  Daughter spoke with patient- agreeable to come at 130 pm.

## 2017-05-04 NOTE — Progress Notes (Signed)
Patient's oxygen sats in clinic today after getting out of w/c was 88%.  Pt currently on 4L of oxygen. Patient's sats rose to 95% on 4 Liters after sitting in w/c for 5 mins.  Patient's daughter Cecille Rubin stated that [facility] would not dispense oxygen to her "as the order was not correct." Per daughter, the "order did not include the amt of oxygen that md was ordering."  Pt also requesting new order to for overlay gel materus and transport chair. The standard w/c will not work for the pt per daughter.   I contacted [facility]. Per [facility], there are no issues/ problems or concerns regarding the oxygen order previously sent to [facility]. No need to send additional documentation or orders for oyxgen.  New orders are needed from md for the gel materus and transport chair. Patient unable to push herself in a standard w/c. Her caregiver needs to be able to transport her easily.

## 2017-05-05 ENCOUNTER — Encounter: Payer: Self-pay | Admitting: *Deleted

## 2017-05-05 ENCOUNTER — Inpatient Hospital Stay: Payer: Medicare Other

## 2017-05-05 ENCOUNTER — Encounter: Payer: Self-pay | Admitting: Internal Medicine

## 2017-05-05 VITALS — BP 115/51 | HR 51 | Temp 97.4°F | Resp 20

## 2017-05-05 DIAGNOSIS — Z7952 Long term (current) use of systemic steroids: Secondary | ICD-10-CM | POA: Diagnosis not present

## 2017-05-05 DIAGNOSIS — Z9225 Personal history of immunosupression therapy: Secondary | ICD-10-CM | POA: Diagnosis not present

## 2017-05-05 DIAGNOSIS — Z5111 Encounter for antineoplastic chemotherapy: Secondary | ICD-10-CM | POA: Diagnosis not present

## 2017-05-05 DIAGNOSIS — Z880 Allergy status to penicillin: Secondary | ICD-10-CM | POA: Diagnosis not present

## 2017-05-05 DIAGNOSIS — C771 Secondary and unspecified malignant neoplasm of intrathoracic lymph nodes: Secondary | ICD-10-CM | POA: Diagnosis not present

## 2017-05-05 DIAGNOSIS — R5381 Other malaise: Secondary | ICD-10-CM | POA: Diagnosis not present

## 2017-05-05 DIAGNOSIS — C3412 Malignant neoplasm of upper lobe, left bronchus or lung: Secondary | ICD-10-CM

## 2017-05-05 DIAGNOSIS — J449 Chronic obstructive pulmonary disease, unspecified: Secondary | ICD-10-CM | POA: Diagnosis not present

## 2017-05-05 DIAGNOSIS — K219 Gastro-esophageal reflux disease without esophagitis: Secondary | ICD-10-CM | POA: Diagnosis not present

## 2017-05-05 DIAGNOSIS — E7800 Pure hypercholesterolemia, unspecified: Secondary | ICD-10-CM | POA: Diagnosis not present

## 2017-05-05 DIAGNOSIS — M1990 Unspecified osteoarthritis, unspecified site: Secondary | ICD-10-CM | POA: Diagnosis not present

## 2017-05-05 DIAGNOSIS — E559 Vitamin D deficiency, unspecified: Secondary | ICD-10-CM | POA: Diagnosis not present

## 2017-05-05 DIAGNOSIS — I4891 Unspecified atrial fibrillation: Secondary | ICD-10-CM | POA: Diagnosis not present

## 2017-05-05 DIAGNOSIS — E032 Hypothyroidism due to medicaments and other exogenous substances: Secondary | ICD-10-CM | POA: Diagnosis not present

## 2017-05-05 DIAGNOSIS — R042 Hemoptysis: Secondary | ICD-10-CM | POA: Diagnosis not present

## 2017-05-05 DIAGNOSIS — I129 Hypertensive chronic kidney disease with stage 1 through stage 4 chronic kidney disease, or unspecified chronic kidney disease: Secondary | ICD-10-CM | POA: Diagnosis not present

## 2017-05-05 DIAGNOSIS — N189 Chronic kidney disease, unspecified: Secondary | ICD-10-CM | POA: Diagnosis not present

## 2017-05-05 DIAGNOSIS — R110 Nausea: Secondary | ICD-10-CM | POA: Diagnosis not present

## 2017-05-05 DIAGNOSIS — Z7901 Long term (current) use of anticoagulants: Secondary | ICD-10-CM | POA: Diagnosis not present

## 2017-05-05 DIAGNOSIS — Z9981 Dependence on supplemental oxygen: Secondary | ICD-10-CM | POA: Diagnosis not present

## 2017-05-05 DIAGNOSIS — Z79899 Other long term (current) drug therapy: Secondary | ICD-10-CM | POA: Diagnosis not present

## 2017-05-05 DIAGNOSIS — Z87891 Personal history of nicotine dependence: Secondary | ICD-10-CM | POA: Diagnosis not present

## 2017-05-05 DIAGNOSIS — M810 Age-related osteoporosis without current pathological fracture: Secondary | ICD-10-CM | POA: Diagnosis not present

## 2017-05-05 DIAGNOSIS — R5383 Other fatigue: Secondary | ICD-10-CM | POA: Diagnosis not present

## 2017-05-05 LAB — THYROID PANEL WITH TSH
Free Thyroxine Index: 3.2 (ref 1.2–4.9)
T3 Uptake Ratio: 31 % (ref 24–39)
T4 TOTAL: 10.3 ug/dL (ref 4.5–12.0)
TSH: 0.887 u[IU]/mL (ref 0.450–4.500)

## 2017-05-05 MED ORDER — PACLITAXEL PROTEIN-BOUND CHEMO INJECTION 100 MG
80.0000 mg/m2 | Freq: Once | INTRAVENOUS | Status: AC
  Administered 2017-05-05: 125 mg via INTRAVENOUS
  Filled 2017-05-05: qty 25

## 2017-05-05 MED ORDER — SODIUM CHLORIDE 0.9 % IV SOLN: Freq: Once | INTRAVENOUS | Status: DC

## 2017-05-05 MED ORDER — ONDANSETRON 8 MG PO TBDP
8.0000 mg | ORAL_TABLET | Freq: Once | ORAL | Status: AC
  Administered 2017-05-05: 8 mg via ORAL
  Filled 2017-05-05: qty 1

## 2017-05-05 MED ORDER — SODIUM CHLORIDE 0.9 % IV SOLN
Freq: Once | INTRAVENOUS | Status: AC
  Administered 2017-05-05: 14:00:00 via INTRAVENOUS
  Filled 2017-05-05: qty 1000

## 2017-05-05 MED ORDER — PROCHLORPERAZINE MALEATE 10 MG PO TABS
10.0000 mg | ORAL_TABLET | Freq: Once | ORAL | Status: AC
  Administered 2017-05-05: 10 mg via ORAL
  Filled 2017-05-05: qty 1

## 2017-05-05 MED ORDER — DEXAMETHASONE SODIUM PHOSPHATE 10 MG/ML IJ SOLN
10.0000 mg | Freq: Once | INTRAMUSCULAR | Status: AC
  Administered 2017-05-05: 10 mg via INTRAVENOUS
  Filled 2017-05-05: qty 1

## 2017-05-06 ENCOUNTER — Encounter: Payer: Self-pay | Admitting: Internal Medicine

## 2017-05-06 DIAGNOSIS — C3412 Malignant neoplasm of upper lobe, left bronchus or lung: Secondary | ICD-10-CM | POA: Diagnosis not present

## 2017-05-06 DIAGNOSIS — Z79891 Long term (current) use of opiate analgesic: Secondary | ICD-10-CM | POA: Diagnosis not present

## 2017-05-06 DIAGNOSIS — M810 Age-related osteoporosis without current pathological fracture: Secondary | ICD-10-CM | POA: Diagnosis not present

## 2017-05-06 DIAGNOSIS — M1991 Primary osteoarthritis, unspecified site: Secondary | ICD-10-CM | POA: Diagnosis not present

## 2017-05-06 DIAGNOSIS — E1122 Type 2 diabetes mellitus with diabetic chronic kidney disease: Secondary | ICD-10-CM | POA: Diagnosis not present

## 2017-05-06 DIAGNOSIS — Z7952 Long term (current) use of systemic steroids: Secondary | ICD-10-CM | POA: Diagnosis not present

## 2017-05-06 DIAGNOSIS — J441 Chronic obstructive pulmonary disease with (acute) exacerbation: Secondary | ICD-10-CM | POA: Diagnosis not present

## 2017-05-06 DIAGNOSIS — I129 Hypertensive chronic kidney disease with stage 1 through stage 4 chronic kidney disease, or unspecified chronic kidney disease: Secondary | ICD-10-CM | POA: Diagnosis not present

## 2017-05-06 DIAGNOSIS — N183 Chronic kidney disease, stage 3 (moderate): Secondary | ICD-10-CM | POA: Diagnosis not present

## 2017-05-06 DIAGNOSIS — K219 Gastro-esophageal reflux disease without esophagitis: Secondary | ICD-10-CM | POA: Diagnosis not present

## 2017-05-09 ENCOUNTER — Ambulatory Visit: Payer: Medicare Other | Admitting: Pulmonary Disease

## 2017-05-11 DIAGNOSIS — M810 Age-related osteoporosis without current pathological fracture: Secondary | ICD-10-CM | POA: Diagnosis not present

## 2017-05-11 DIAGNOSIS — C3412 Malignant neoplasm of upper lobe, left bronchus or lung: Secondary | ICD-10-CM | POA: Diagnosis not present

## 2017-05-11 DIAGNOSIS — I129 Hypertensive chronic kidney disease with stage 1 through stage 4 chronic kidney disease, or unspecified chronic kidney disease: Secondary | ICD-10-CM | POA: Diagnosis not present

## 2017-05-11 DIAGNOSIS — J449 Chronic obstructive pulmonary disease, unspecified: Secondary | ICD-10-CM | POA: Diagnosis not present

## 2017-05-11 DIAGNOSIS — Z7952 Long term (current) use of systemic steroids: Secondary | ICD-10-CM | POA: Diagnosis not present

## 2017-05-11 DIAGNOSIS — J441 Chronic obstructive pulmonary disease with (acute) exacerbation: Secondary | ICD-10-CM | POA: Diagnosis not present

## 2017-05-11 DIAGNOSIS — R05 Cough: Secondary | ICD-10-CM | POA: Diagnosis not present

## 2017-05-11 DIAGNOSIS — M1991 Primary osteoarthritis, unspecified site: Secondary | ICD-10-CM | POA: Diagnosis not present

## 2017-05-11 DIAGNOSIS — I279 Pulmonary heart disease, unspecified: Secondary | ICD-10-CM | POA: Diagnosis not present

## 2017-05-11 DIAGNOSIS — R0602 Shortness of breath: Secondary | ICD-10-CM | POA: Diagnosis not present

## 2017-05-11 DIAGNOSIS — N183 Chronic kidney disease, stage 3 (moderate): Secondary | ICD-10-CM | POA: Diagnosis not present

## 2017-05-11 DIAGNOSIS — Z79891 Long term (current) use of opiate analgesic: Secondary | ICD-10-CM | POA: Diagnosis not present

## 2017-05-11 DIAGNOSIS — R062 Wheezing: Secondary | ICD-10-CM | POA: Diagnosis not present

## 2017-05-11 DIAGNOSIS — E1122 Type 2 diabetes mellitus with diabetic chronic kidney disease: Secondary | ICD-10-CM | POA: Diagnosis not present

## 2017-05-11 DIAGNOSIS — K219 Gastro-esophageal reflux disease without esophagitis: Secondary | ICD-10-CM | POA: Diagnosis not present

## 2017-05-13 ENCOUNTER — Ambulatory Visit: Payer: Medicare Other

## 2017-05-13 ENCOUNTER — Other Ambulatory Visit: Payer: Medicare Other

## 2017-05-13 ENCOUNTER — Ambulatory Visit: Payer: Medicare Other | Admitting: Internal Medicine

## 2017-05-13 DIAGNOSIS — I129 Hypertensive chronic kidney disease with stage 1 through stage 4 chronic kidney disease, or unspecified chronic kidney disease: Secondary | ICD-10-CM | POA: Diagnosis not present

## 2017-05-13 DIAGNOSIS — E1122 Type 2 diabetes mellitus with diabetic chronic kidney disease: Secondary | ICD-10-CM | POA: Diagnosis not present

## 2017-05-13 DIAGNOSIS — M1991 Primary osteoarthritis, unspecified site: Secondary | ICD-10-CM | POA: Diagnosis not present

## 2017-05-13 DIAGNOSIS — Z79891 Long term (current) use of opiate analgesic: Secondary | ICD-10-CM | POA: Diagnosis not present

## 2017-05-13 DIAGNOSIS — N183 Chronic kidney disease, stage 3 (moderate): Secondary | ICD-10-CM | POA: Diagnosis not present

## 2017-05-13 DIAGNOSIS — Z7952 Long term (current) use of systemic steroids: Secondary | ICD-10-CM | POA: Diagnosis not present

## 2017-05-13 DIAGNOSIS — J441 Chronic obstructive pulmonary disease with (acute) exacerbation: Secondary | ICD-10-CM | POA: Diagnosis not present

## 2017-05-13 DIAGNOSIS — J449 Chronic obstructive pulmonary disease, unspecified: Secondary | ICD-10-CM | POA: Diagnosis not present

## 2017-05-13 DIAGNOSIS — K219 Gastro-esophageal reflux disease without esophagitis: Secondary | ICD-10-CM | POA: Diagnosis not present

## 2017-05-13 DIAGNOSIS — M810 Age-related osteoporosis without current pathological fracture: Secondary | ICD-10-CM | POA: Diagnosis not present

## 2017-05-13 DIAGNOSIS — C3412 Malignant neoplasm of upper lobe, left bronchus or lung: Secondary | ICD-10-CM | POA: Diagnosis not present

## 2017-05-17 DIAGNOSIS — I129 Hypertensive chronic kidney disease with stage 1 through stage 4 chronic kidney disease, or unspecified chronic kidney disease: Secondary | ICD-10-CM | POA: Diagnosis not present

## 2017-05-17 DIAGNOSIS — M810 Age-related osteoporosis without current pathological fracture: Secondary | ICD-10-CM | POA: Diagnosis not present

## 2017-05-17 DIAGNOSIS — J441 Chronic obstructive pulmonary disease with (acute) exacerbation: Secondary | ICD-10-CM | POA: Diagnosis not present

## 2017-05-17 DIAGNOSIS — E1122 Type 2 diabetes mellitus with diabetic chronic kidney disease: Secondary | ICD-10-CM | POA: Diagnosis not present

## 2017-05-17 DIAGNOSIS — N183 Chronic kidney disease, stage 3 (moderate): Secondary | ICD-10-CM | POA: Diagnosis not present

## 2017-05-17 DIAGNOSIS — K219 Gastro-esophageal reflux disease without esophagitis: Secondary | ICD-10-CM | POA: Diagnosis not present

## 2017-05-17 DIAGNOSIS — Z7952 Long term (current) use of systemic steroids: Secondary | ICD-10-CM | POA: Diagnosis not present

## 2017-05-17 DIAGNOSIS — M1991 Primary osteoarthritis, unspecified site: Secondary | ICD-10-CM | POA: Diagnosis not present

## 2017-05-17 DIAGNOSIS — Z79891 Long term (current) use of opiate analgesic: Secondary | ICD-10-CM | POA: Diagnosis not present

## 2017-05-17 DIAGNOSIS — C3412 Malignant neoplasm of upper lobe, left bronchus or lung: Secondary | ICD-10-CM | POA: Diagnosis not present

## 2017-05-18 DIAGNOSIS — J449 Chronic obstructive pulmonary disease, unspecified: Secondary | ICD-10-CM | POA: Diagnosis not present

## 2017-05-19 ENCOUNTER — Ambulatory Visit: Payer: Medicare Other | Admitting: Internal Medicine

## 2017-05-19 ENCOUNTER — Inpatient Hospital Stay: Payer: Medicare Other | Admitting: Internal Medicine

## 2017-05-19 ENCOUNTER — Inpatient Hospital Stay: Payer: Medicare Other

## 2017-05-19 ENCOUNTER — Other Ambulatory Visit: Payer: Medicare Other

## 2017-05-19 ENCOUNTER — Ambulatory Visit: Payer: Medicare Other

## 2017-05-19 DIAGNOSIS — J441 Chronic obstructive pulmonary disease with (acute) exacerbation: Secondary | ICD-10-CM | POA: Diagnosis not present

## 2017-05-19 DIAGNOSIS — N183 Chronic kidney disease, stage 3 (moderate): Secondary | ICD-10-CM | POA: Diagnosis not present

## 2017-05-19 DIAGNOSIS — M1991 Primary osteoarthritis, unspecified site: Secondary | ICD-10-CM | POA: Diagnosis not present

## 2017-05-19 DIAGNOSIS — K219 Gastro-esophageal reflux disease without esophagitis: Secondary | ICD-10-CM | POA: Diagnosis not present

## 2017-05-19 DIAGNOSIS — M810 Age-related osteoporosis without current pathological fracture: Secondary | ICD-10-CM | POA: Diagnosis not present

## 2017-05-19 DIAGNOSIS — Z7952 Long term (current) use of systemic steroids: Secondary | ICD-10-CM | POA: Diagnosis not present

## 2017-05-19 DIAGNOSIS — I129 Hypertensive chronic kidney disease with stage 1 through stage 4 chronic kidney disease, or unspecified chronic kidney disease: Secondary | ICD-10-CM | POA: Diagnosis not present

## 2017-05-19 DIAGNOSIS — E1122 Type 2 diabetes mellitus with diabetic chronic kidney disease: Secondary | ICD-10-CM | POA: Diagnosis not present

## 2017-05-19 DIAGNOSIS — C3412 Malignant neoplasm of upper lobe, left bronchus or lung: Secondary | ICD-10-CM | POA: Diagnosis not present

## 2017-05-19 DIAGNOSIS — Z79891 Long term (current) use of opiate analgesic: Secondary | ICD-10-CM | POA: Diagnosis not present

## 2017-05-20 ENCOUNTER — Inpatient Hospital Stay (HOSPITAL_BASED_OUTPATIENT_CLINIC_OR_DEPARTMENT_OTHER): Payer: Medicare Other | Admitting: Internal Medicine

## 2017-05-20 ENCOUNTER — Inpatient Hospital Stay: Payer: Medicare Other

## 2017-05-20 ENCOUNTER — Inpatient Hospital Stay: Payer: Medicare Other | Attending: Internal Medicine

## 2017-05-20 VITALS — BP 145/76 | HR 60 | Resp 20

## 2017-05-20 VITALS — BP 121/55 | HR 57 | Temp 97.6°F | Resp 22 | Ht 61.0 in | Wt 124.5 lb

## 2017-05-20 DIAGNOSIS — N189 Chronic kidney disease, unspecified: Secondary | ICD-10-CM

## 2017-05-20 DIAGNOSIS — R197 Diarrhea, unspecified: Secondary | ICD-10-CM | POA: Insufficient documentation

## 2017-05-20 DIAGNOSIS — Z87891 Personal history of nicotine dependence: Secondary | ICD-10-CM | POA: Insufficient documentation

## 2017-05-20 DIAGNOSIS — D510 Vitamin B12 deficiency anemia due to intrinsic factor deficiency: Secondary | ICD-10-CM | POA: Diagnosis not present

## 2017-05-20 DIAGNOSIS — R05 Cough: Secondary | ICD-10-CM | POA: Insufficient documentation

## 2017-05-20 DIAGNOSIS — R042 Hemoptysis: Secondary | ICD-10-CM | POA: Insufficient documentation

## 2017-05-20 DIAGNOSIS — Z803 Family history of malignant neoplasm of breast: Secondary | ICD-10-CM

## 2017-05-20 DIAGNOSIS — Z880 Allergy status to penicillin: Secondary | ICD-10-CM

## 2017-05-20 DIAGNOSIS — I129 Hypertensive chronic kidney disease with stage 1 through stage 4 chronic kidney disease, or unspecified chronic kidney disease: Secondary | ICD-10-CM

## 2017-05-20 DIAGNOSIS — M810 Age-related osteoporosis without current pathological fracture: Secondary | ICD-10-CM

## 2017-05-20 DIAGNOSIS — C771 Secondary and unspecified malignant neoplasm of intrathoracic lymph nodes: Secondary | ICD-10-CM | POA: Diagnosis not present

## 2017-05-20 DIAGNOSIS — M1990 Unspecified osteoarthritis, unspecified site: Secondary | ICD-10-CM

## 2017-05-20 DIAGNOSIS — R110 Nausea: Secondary | ICD-10-CM | POA: Diagnosis not present

## 2017-05-20 DIAGNOSIS — I4891 Unspecified atrial fibrillation: Secondary | ICD-10-CM | POA: Insufficient documentation

## 2017-05-20 DIAGNOSIS — Z808 Family history of malignant neoplasm of other organs or systems: Secondary | ICD-10-CM

## 2017-05-20 DIAGNOSIS — E871 Hypo-osmolality and hyponatremia: Secondary | ICD-10-CM | POA: Diagnosis not present

## 2017-05-20 DIAGNOSIS — E7800 Pure hypercholesterolemia, unspecified: Secondary | ICD-10-CM

## 2017-05-20 DIAGNOSIS — R42 Dizziness and giddiness: Secondary | ICD-10-CM | POA: Insufficient documentation

## 2017-05-20 DIAGNOSIS — Z9225 Personal history of immunosupression therapy: Secondary | ICD-10-CM

## 2017-05-20 DIAGNOSIS — R5383 Other fatigue: Secondary | ICD-10-CM

## 2017-05-20 DIAGNOSIS — E032 Hypothyroidism due to medicaments and other exogenous substances: Secondary | ICD-10-CM | POA: Insufficient documentation

## 2017-05-20 DIAGNOSIS — J9610 Chronic respiratory failure, unspecified whether with hypoxia or hypercapnia: Secondary | ICD-10-CM | POA: Insufficient documentation

## 2017-05-20 DIAGNOSIS — C3412 Malignant neoplasm of upper lobe, left bronchus or lung: Secondary | ICD-10-CM

## 2017-05-20 DIAGNOSIS — K219 Gastro-esophageal reflux disease without esophagitis: Secondary | ICD-10-CM

## 2017-05-20 DIAGNOSIS — K5900 Constipation, unspecified: Secondary | ICD-10-CM | POA: Diagnosis not present

## 2017-05-20 DIAGNOSIS — E559 Vitamin D deficiency, unspecified: Secondary | ICD-10-CM

## 2017-05-20 DIAGNOSIS — Z7901 Long term (current) use of anticoagulants: Secondary | ICD-10-CM | POA: Insufficient documentation

## 2017-05-20 DIAGNOSIS — J449 Chronic obstructive pulmonary disease, unspecified: Secondary | ICD-10-CM | POA: Diagnosis not present

## 2017-05-20 DIAGNOSIS — Z9981 Dependence on supplemental oxygen: Secondary | ICD-10-CM | POA: Diagnosis not present

## 2017-05-20 DIAGNOSIS — Z79899 Other long term (current) drug therapy: Secondary | ICD-10-CM

## 2017-05-20 DIAGNOSIS — J9811 Atelectasis: Secondary | ICD-10-CM | POA: Insufficient documentation

## 2017-05-20 DIAGNOSIS — Z8051 Family history of malignant neoplasm of kidney: Secondary | ICD-10-CM | POA: Insufficient documentation

## 2017-05-20 DIAGNOSIS — R51 Headache: Secondary | ICD-10-CM | POA: Insufficient documentation

## 2017-05-20 DIAGNOSIS — R630 Anorexia: Secondary | ICD-10-CM | POA: Diagnosis not present

## 2017-05-20 DIAGNOSIS — C801 Malignant (primary) neoplasm, unspecified: Secondary | ICD-10-CM

## 2017-05-20 DIAGNOSIS — R0602 Shortness of breath: Secondary | ICD-10-CM

## 2017-05-20 DIAGNOSIS — R059 Cough, unspecified: Secondary | ICD-10-CM

## 2017-05-20 DIAGNOSIS — Z66 Do not resuscitate: Secondary | ICD-10-CM | POA: Insufficient documentation

## 2017-05-20 DIAGNOSIS — T451X5A Adverse effect of antineoplastic and immunosuppressive drugs, initial encounter: Secondary | ICD-10-CM

## 2017-05-20 LAB — COMPREHENSIVE METABOLIC PANEL
ALBUMIN: 3.3 g/dL — AB (ref 3.5–5.0)
ALK PHOS: 84 U/L (ref 38–126)
ALT: 7 U/L — ABNORMAL LOW (ref 14–54)
AST: 16 U/L (ref 15–41)
Anion gap: 9 (ref 5–15)
BILIRUBIN TOTAL: 0.6 mg/dL (ref 0.3–1.2)
BUN: 14 mg/dL (ref 6–20)
CHLORIDE: 100 mmol/L — AB (ref 101–111)
CO2: 25 mmol/L (ref 22–32)
Calcium: 8.9 mg/dL (ref 8.9–10.3)
Creatinine, Ser: 1.23 mg/dL — ABNORMAL HIGH (ref 0.44–1.00)
GFR calc Af Amer: 45 mL/min — ABNORMAL LOW (ref >60)
GFR calc non Af Amer: 39 mL/min — ABNORMAL LOW (ref >60)
Glucose, Bld: 127 mg/dL — ABNORMAL HIGH (ref 65–99)
Potassium: 3.8 mmol/L (ref 3.5–5.1)
Sodium: 134 mmol/L — ABNORMAL LOW (ref 135–145)
TOTAL PROTEIN: 6.5 g/dL (ref 6.5–8.1)

## 2017-05-20 LAB — CBC WITH DIFFERENTIAL/PLATELET
BASOS PCT: 1 %
Basophils Absolute: 0 10*3/uL (ref 0–0.1)
Eosinophils Absolute: 0.3 10*3/uL (ref 0–0.7)
Eosinophils Relative: 7 %
HEMATOCRIT: 28.4 % — AB (ref 35.0–47.0)
HEMOGLOBIN: 9.5 g/dL — AB (ref 12.0–16.0)
LYMPHS ABS: 0.5 10*3/uL — AB (ref 1.0–3.6)
LYMPHS PCT: 10 %
MCH: 27.5 pg (ref 26.0–34.0)
MCHC: 33.5 g/dL (ref 32.0–36.0)
MCV: 82.1 fL (ref 80.0–100.0)
MONOS PCT: 18 %
Monocytes Absolute: 0.9 10*3/uL (ref 0.2–0.9)
NEUTROS ABS: 3.2 10*3/uL (ref 1.4–6.5)
NEUTROS PCT: 64 %
Platelets: 285 10*3/uL (ref 150–440)
RBC: 3.46 MIL/uL — ABNORMAL LOW (ref 3.80–5.20)
RDW: 14.5 % (ref 11.5–14.5)
WBC: 4.9 10*3/uL (ref 3.6–11.0)

## 2017-05-20 MED ORDER — HYDROCOD POLST-CPM POLST ER 10-8 MG/5ML PO SUER: 5.0000 mL | Freq: Two times a day (BID) | ORAL | 0 refills | Status: DC | PRN

## 2017-05-20 MED ORDER — PROMETHAZINE HCL 12.5 MG PO TABS: 12.5000 mg | ORAL_TABLET | Freq: Four times a day (QID) | ORAL | 3 refills | Status: AC | PRN

## 2017-05-20 MED ORDER — ESOMEPRAZOLE MAGNESIUM 40 MG PO CPDR: 40.0000 mg | DELAYED_RELEASE_CAPSULE | Freq: Every day | ORAL | 6 refills | Status: AC

## 2017-05-20 MED ORDER — SODIUM CHLORIDE 0.9 % IV SOLN
Freq: Once | INTRAVENOUS | Status: AC
  Administered 2017-05-20: 15:00:00 via INTRAVENOUS
  Filled 2017-05-20: qty 1000

## 2017-05-20 NOTE — Progress Notes (Signed)
Pax NOTE  Patient Care Team: Einar Pheasant, MD as PCP - General (Internal Medicine) Wellington Hampshire, MD as Consulting Physician (Cardiology)  CHIEF COMPLAINTS/PURPOSE OF CONSULTATION: Lung cancer  #  Oncology History   # SEP 2017- SQUAMOUS CELL CA [s/p Bronch Dr.S Khan];PET - LEFT UPPER LOBE MASS- left upper lung collpase; mediastinal LN; Contralateral Right nodules [x3];STAGE IV [contralateral lung nodules]  # PDL-1- 90% [Keytruda]; START NOV 2017; June 2018- PROGRESSION  # July 6th 2018- ABRAXANE   # IATROGENIC HYPOTHYROIDISM    # Hx of smoking/ COPD/ CKD [creat ~ 1.5]     Primary cancer of left upper lobe of lung ([place])     HISTORY OF PRESENTING ILLNESS:  Alice Delgado 83 y.o.  female with squamous cell lung cancer stage IVCurrently on second line therapy with Abraxane is here for follow-up.  Patient underwent cycle #2 day 1 Abraxane approximately 1 week days ago.  Patient started noticing hair loss. She also complains of diarrhea that started yesterday; no blood; no cramping in abdomen. She had about 9 loose stools since yesterday. Patient continues to feel poorly; continues to have shortness of breath. Continues to need oxygen 4 L for the clock.   She continues to have cough. No hemoptysis. Notes to have an episode of headache.denies any focal deficits. Continues to feel fatigued.  Continues to have reflux. Continues to have nausea.  ROS: A complete 10 point review of system is done which is negative except mentioned above in history of present illness  MEDICAL HISTORY:  Past Medical History:  Diagnosis Date  . Asthma   . COPD (chronic obstructive pulmonary disease) ([place])   . Diverticulosis   . GERD (gastroesophageal reflux disease)   . Hiatal hernia   . Hypercholesterolemia   . Hyperglycemia   . Hypertension   . Hypothyroidism   . Osteoarthritis    lumbar disc dz, cervical disc dz, hands  . Osteoporosis    intolerance to  fosamax, evista and miacalcin  . Peptic ulcer disease   . Pernicious anemia   . Shortness of breath   . Vitamin D deficiency     SURGICAL HISTORY: Past Surgical History:  Procedure Laterality Date  . ABDOMINAL HYSTERECTOMY     with benign ovarian tumor removed.    Marland Kitchen FLEXIBLE BRONCHOSCOPY N/A 06/16/2016   Procedure: FLEXIBLE BRONCHOSCOPY;  Surgeon: Allyne Gee, MD;  Location: [facility] ORS;  Service: Pulmonary;  Laterality: N/A;  . 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 No alcohol. Social History   Social History  . Marital status: Widowed    Spouse name: N/A  . Number of children: 2  . Years of education: N/A   Occupational History  . Not on file.   Social History Main Topics  . Smoking status: Former Smoker    Years: 15.00    Types: Cigarettes  . Smokeless tobacco: Never Used  . Alcohol use No  . Drug use: No  . Sexual activity: Not on file   Other Topics Concern  . Not on file   Social History Narrative  . No narrative on file    FAMILY HISTORY: Family History  Problem Relation Age of Onset  . Heart disease Father        myocardial infarction  . Heart attack Father   . Cancer Mother        cancer of lymph nodes and kidney  .  Breast cancer Sister   . Colon cancer Neg Hx     ALLERGIES:  is allergic to penicillins; augmentin [amoxicillin-pot clavulanate]; evista [raloxifene]; fosamax [alendronate sodium]; lipitor [atorvastatin]; miralax [polyethylene glycol]; and zetia [ezetimibe].  MEDICATIONS:  Current Outpatient Prescriptions  Medication Sig Dispense Refill  . albuterol (PROAIR HFA) 108 (90 Base) MCG/ACT inhaler Inhale 2 puffs into the lungs every 6 (six) hours as needed for wheezing or shortness of breath. 18 g 5  . albuterol (PROVENTIL) (2.5 MG/3ML) 0.083% nebulizer solution Take 3 mLs (2.5 mg total) by nebulization 2 (two) times daily. DX: EMPHYSEMA DX CODE: J43.8 75 mL 12  . apixaban  (ELIQUIS) 5 MG TABS tablet Take 1 tablet (5 mg total) by mouth 2 (two) times daily. (Patient taking differently: Take 5 mg by mouth daily. ) 60 tablet 5  . benzonatate (TESSALON) 200 MG capsule Take 1 capsule (200 mg total) by mouth 3 (three) times daily as needed for cough. 90 capsule 3  . BREO ELLIPTA 100-25 MCG/INH AEPB INHALE 1 PUFF BY MOUTH DAILY 30 each 0  . cyanocobalamin (,VITAMIN B-12,) 1000 MCG/ML injection Inject 1 mL (1,000 mcg total) into the muscle every 30 (thirty) days. 10 mL 1  . HYDROcodone-acetaminophen (VICODIN) 5-325 mg TABS tablet One pill every night as needed. 60 tablet 0  . hydrOXYzine (ATARAX/VISTARIL) 25 MG tablet TAKE 1 TABLET(25 MG) BY MOUTH THREE TIMES DAILY AS NEEDED FOR ITCHING 60 tablet 0  . ipratropium-albuterol (DUONEB) 0.5-2.5 (3) MG/3ML SOLN Take 3 mLs by nebulization as needed.    Marland Kitchen levothyroxine (SYNTHROID, LEVOTHROID) 88 MCG tablet Take 1 tablet (88 mcg total) by mouth daily before breakfast. 30 tablet 4  . metoprolol tartrate (LOPRESSOR) 50 MG tablet TAKE 1 TABLET(50 MG) BY MOUTH TWICE DAILY 60 tablet 3  . montelukast (SINGULAIR) 10 MG tablet Take 1 tablet (10 mg total) by mouth at bedtime. (Patient taking differently: Take 10 mg by mouth every morning. ) 90 tablet 0  . ondansetron (ZOFRAN) 4 MG tablet TK 1 T PO BID PRF NAUSEA  1  . prochlorperazine (COMPAZINE) 10 MG tablet Take 1 tablet (10 mg total) by mouth every 6 (six) hours as needed for nausea or vomiting. 30 tablet 3  . spironolactone (ALDACTONE) 25 MG tablet TAKE 1 TABLET BY MOUTH DAILY 30 tablet 0  . Tiotropium Bromide Monohydrate (SPIRIVA RESPIMAT) 2.5 MCG/ACT AERS Inhale 2 puffs into the lungs daily. 4 g 0  . chlorpheniramine-HYDROcodone (TUSSIONEX) 10-8 MG/5ML SUER Take 5 mLs by mouth every 12 (twelve) hours as needed for cough. 140 mL 0  . esomeprazole (NEXIUM) 40 MG capsule Take 1 capsule (40 mg total) by mouth daily at 12 noon. 30 capsule 6  . nitroGLYCERIN (NITROSTAT) 0.4 MG SL tablet Place 1  tablet (0.4 mg total) under the tongue every 5 (five) minutes as needed for chest pain. (Patient not taking: Reported on 05/04/2017) 25 tablet 2  . nystatin cream (MYCOSTATIN) Apply 1 application topically 3 (three) times daily.  2  . NYSTATIN powder Apply 1 application topically 4 (four) times daily.  2  . promethazine (PHENERGAN) 12.5 MG tablet Take 1 tablet (12.5 mg total) by mouth every 6 (six) hours as needed for nausea or vomiting. 30 tablet 3  . triamcinolone ointment (KENALOG) 0.5 % APPLY EXTERNALLY TO THE AFFECTED AREA TWICE DAILY (Patient not taking: Reported on 05/04/2017) 30 g 0   No current facility-administered medications for this visit.    Facility-Administered Medications Ordered in Other Visits  Medication Dose Route  Frequency Provider Last Rate Last Dose  . 0.9 %  sodium chloride infusion   Intravenous Once Cammie Sickle, MD   Stopped at 05/20/17 1630      .  PHYSICAL EXAMINATION: ECOG PERFORMANCE STATUS: 1 - Symptomatic but completely ambulatory  Vitals:   05/20/17 1331  BP: (!) 121/55  Pulse: (!) 57  Resp: (!) 22  Temp: 97.6 F (36.4 C)   Filed Weights   05/20/17 1331  Weight: 124 lb 8 oz (56.5 kg)    GENERAL: Elderly Caucasian female patient appears frail.; Alert, no distress and comfortable.  With daughters/ neice.She is in a wheelchair. She is on 4 L of oxygen. She appears to have lost weight.  EYES: no pallor or icterus OROPHARYNX: no thrush or ulceration; good dentition  NECK: supple, no masses felt LYMPH:  no palpable lymphadenopathy in the cervical, axillary or inguinal regions LUNGS: Decreased breath sounds on the left side compared to the right.  HEART/CVS: irregular rhythm and no murmurs; No lower extremity edema ABDOMEN: abdomen soft, non-tender and normal bowel sounds Musculoskeletal:no cyanosis of digits and no clubbing  PSYCH: alert & oriented x 3 with fluent speech NEURO: no focal motor/sensory deficits Skin- multiple ecchymosis.  Otherwise no rash.  LABORATORY DATA:  I have reviewed the data as listed Lab Results  Component Value Date   WBC 4.9 05/20/2017   HGB 9.5 (L) 05/20/2017   HCT 28.4 (L) 05/20/2017   MCV 82.1 05/20/2017   PLT 285 05/20/2017    Recent Labs  04/01/17 1405 04/22/17 1338 05/04/17 1315 05/20/17 1256  NA 129* 128* 128* 134*  K 4.2 4.3 4.1 3.8  CL 94* 94* 97* 100*  CO2 '26 25 23 25  '$ GLUCOSE 114* 142* 137* 127*  BUN 19 22* 19 14  CREATININE 1.41* 1.49* 1.37* 1.23*  CALCIUM 9.2 9.1 9.2 8.9  GFRNONAA 33* 31* 34* 39*  GFRAA 38* 35* 39* 45*  PROT 7.2 6.9  --  6.5  ALBUMIN 3.8 3.4*  --  3.3*  AST 17 14*  --  16  ALT 8* 6*  --  7*  ALKPHOS 87 83  --  84  BILITOT 0.5 0.7  --  0.6    RADIOGRAPHIC STUDIES: I have personally reviewed the radiological images as listed and agreed with the findings in the report. No results found.  ASSESSMENT & PLAN:   Primary cancer of left upper lobe of lung ([place])  # Squamous cell carcinoma of the left upper lobe with atelectasis left upper lung; and also contralateral lung nodules- stage IV lung cancer; currently on second line chemo abraxane cycle #2; day #8  approximately 2 week ago.  # Patient seems to be tolerating chemotherapy with mild-to-moderate difficulties. [C discussion below]. Recommend holding chemotherapy today given the ongoing diarrhea. Discussed with the patient and family that if she continues to have difficulty tolerating treatments- hospice would be recommended. She understands; but wants to wait for 2 more weeks to make any decisions.  # Diarrhea question chemotherapy versus other causes. Recommend Imodium. Also recommend checking C. Difficile/GI stool PCR.  # Chronic nausea- likely secondary to malignancy versus chemotherapy continue antiemetics.  #  Intermittent hemoptysis- likely secondary to underlying malignancy/Eliquis. Improved after reducing the dose of Eliquis 2.5 mg once a day.  # Hypothyroidism- secondary to [place];  on Synthroid 88 g.  most recent thyroid profile within normal limits. Continue current dose of Synthroid.  # A. Fib-chronic rate control. On anti-coagulation. See discussion above.   #  Chronic respiratory failure- malignancy/COPD-recommend continued oxygen at 4 L.    # Significant debility- needing help with her daily chores. Recommend continued home health visits.  # DNR/DNI form signed. Patient does not want to be resuscitated. She has expressed her wishes explicitly; also family agrees.   # follow up lab/chemo-abraxane- in 2 weeks; HOLD chemo today.      Cammie Sickle, MD 05/20/2017 3:03 PM

## 2017-05-20 NOTE — Assessment & Plan Note (Addendum)
#   Squamous cell carcinoma of the left upper lobe with atelectasis left upper lung; and also contralateral lung nodules- stage IV lung cancer; currently on second line chemo abraxane cycle #2; day #8  approximately 2 week ago.  # Patient seems to be tolerating chemotherapy with mild-to-moderate difficulties. [C discussion below]. Recommend holding chemotherapy today given the ongoing diarrhea. Discussed with the patient and family that if she continues to have difficulty tolerating treatments- hospice would be recommended. She understands; but wants to wait for 2 more weeks to make any decisions.  # Diarrhea question chemotherapy versus other causes. Recommend Imodium. Also recommend checking C. Difficile/GI stool PCR.  # Chronic nausea- likely secondary to malignancy versus chemotherapy continue antiemetics.  #  Intermittent hemoptysis- likely secondary to underlying malignancy/Eliquis. Improved after reducing the dose of Eliquis 2.5 mg once a day.  # Hypothyroidism- secondary to [place]; on Synthroid 88 g. most recent thyroid profile within normal limits. Continue current dose of Synthroid.  # A. Fib-chronic rate control. On anti-coagulation. See discussion above.   # Chronic respiratory failure- malignancy/COPD-recommend continued oxygen at 4 L.    # Significant debility- needing help with her daily chores. Recommend continued home health visits.  # DNR/DNI form signed. Patient does not want to be resuscitated. She has expressed her wishes explicitly; also family agrees.   # follow up lab/chemo-abraxane- in 2 weeks; HOLD chemo today.

## 2017-05-20 NOTE — Progress Notes (Signed)
Patient here for lung ca f/u. Pt c/o nausea / gerd. Zofran/compazine does not relieve nausea. Pt requesting phenergan and Protonix.  She has used a trial of zantac and Prilosec and the gerd has not improved. Pt c/o diarrhea  Pt reports 9+ loose stools last evening. Did not use any laxatives/stool softeners. Also did not take any imodium AD. Pt very weak.  Pt states that oxygen concentrator does not provide/deliver sufficient oxygen. States that after her last chemo-pt was found very disoriented. Family obtained sats-which were founds to be 80% on 4L oxygen-concentrator.  For this reason, Family/patient requesting portable oxygen tank order to be sent to home oxygen delivery agency.   Patient also requesting out of facility dnr. Pt would like MD to renew home health services. Pt c/o chronic back/rib pain- only using half of tablet of norco at bedtime.  Patient c/o cough- would like rx for tussinex to be provided today.

## 2017-05-21 LAB — THYROID PANEL WITH TSH
FREE THYROXINE INDEX: 2.7 (ref 1.2–4.9)
T3 UPTAKE RATIO: 30 % (ref 24–39)
T4, Total: 9.1 ug/dL (ref 4.5–12.0)
TSH: 1.31 u[IU]/mL (ref 0.450–4.500)

## 2017-05-22 ENCOUNTER — Other Ambulatory Visit: Payer: Self-pay | Admitting: Cardiovascular Disease

## 2017-05-26 ENCOUNTER — Encounter: Payer: Self-pay | Admitting: Internal Medicine

## 2017-05-30 ENCOUNTER — Telehealth: Payer: Self-pay | Admitting: Cardiovascular Disease

## 2017-05-30 DIAGNOSIS — N183 Chronic kidney disease, stage 3 (moderate): Secondary | ICD-10-CM | POA: Diagnosis not present

## 2017-05-30 DIAGNOSIS — C3412 Malignant neoplasm of upper lobe, left bronchus or lung: Secondary | ICD-10-CM | POA: Diagnosis not present

## 2017-05-30 DIAGNOSIS — M810 Age-related osteoporosis without current pathological fracture: Secondary | ICD-10-CM | POA: Diagnosis not present

## 2017-05-30 DIAGNOSIS — M1991 Primary osteoarthritis, unspecified site: Secondary | ICD-10-CM | POA: Diagnosis not present

## 2017-05-30 DIAGNOSIS — Z7952 Long term (current) use of systemic steroids: Secondary | ICD-10-CM | POA: Diagnosis not present

## 2017-05-30 DIAGNOSIS — E1122 Type 2 diabetes mellitus with diabetic chronic kidney disease: Secondary | ICD-10-CM | POA: Diagnosis not present

## 2017-05-30 DIAGNOSIS — I129 Hypertensive chronic kidney disease with stage 1 through stage 4 chronic kidney disease, or unspecified chronic kidney disease: Secondary | ICD-10-CM | POA: Diagnosis not present

## 2017-05-30 DIAGNOSIS — J441 Chronic obstructive pulmonary disease with (acute) exacerbation: Secondary | ICD-10-CM | POA: Diagnosis not present

## 2017-05-30 DIAGNOSIS — Z79891 Long term (current) use of opiate analgesic: Secondary | ICD-10-CM | POA: Diagnosis not present

## 2017-05-30 DIAGNOSIS — K219 Gastro-esophageal reflux disease without esophagitis: Secondary | ICD-10-CM | POA: Diagnosis not present

## 2017-05-30 NOTE — Telephone Encounter (Signed)
Patient daughter calling back to confirm telephone .

## 2017-05-30 NOTE — Telephone Encounter (Signed)
Pt daughter calling stating over the weekend stating patient has been out of rhythm  She is bit dizzy.  BP is low 96/50  Has been in bed all weekend from being so woozy  Would like some advise on this  So at times she states she gets pain in her chest Pt was asking if she can take a nitro but the daughter states she thinks that is not what is needed.  Please call back

## 2017-05-30 NOTE — Telephone Encounter (Signed)
Shortly after initial phone call, Trish, nurse from [facility] arrived to patient's home. She called [place] to let us know patient does not want to go to ER, HR in the 140's and BP 90/50. Patient is resting on her left side without dizziness at this time. She asked would Dr Fletcher Anon like to have labwork drawn. Talked Dr Fletcher Anon who advised ok to order CBC and BMET. Trish verbalized understanding and will put them in as STAT.

## 2017-05-30 NOTE — Telephone Encounter (Signed)
Returned call to patient's daughter ok per DPR. Patient has not been feeling well since last Friday. HR has been fluctuating from the 40's up to the 140's. BP has been 90-100/50-60. Heart rate seems to increase when patient is up doing activity but HR and BP decrease when she's laying down and still.  She has intermittent dizziness and nausea. Chest pain in the mid-lower portion of her sternum since yesterday off and on. Denies jaw or left arm pain. Patient missed a few doses of metoprolol last week but daughter has made sure she's taken it since Saturday. Dr Rogue Bussing decreased Eliquis to 2.5 mg by mouth once a day for nosebleeds. Patient history of lung cancer and currently on Palliative care.  S/w Dr Fletcher Anon concerning patient and this information. He advised for patient to continue currently medications and monitor HR/BP as this could be expected at this time. If patient continues with elevated HR and symptoms patient could proceed to ED.  Daughter made aware of recommendations and verbalized understanding. She is concerned that her mother may not tolerate a trip to the hospital because of her current state of health. Daughter then expressed that she would like patient to go on Hospice; however, patient cannot until she says she no longer wants treatment. Daughter is not sure what she should do at this point. Advised for her to call Dr Rogue Bussing as well and the Palliative care nurse to discuss plan of care and what's best for the patient and family. She was very appreciative for the call.

## 2017-05-31 ENCOUNTER — Encounter: Payer: Self-pay | Admitting: Internal Medicine

## 2017-05-31 ENCOUNTER — Other Ambulatory Visit: Payer: Self-pay | Admitting: *Deleted

## 2017-05-31 DIAGNOSIS — R110 Nausea: Secondary | ICD-10-CM

## 2017-05-31 DIAGNOSIS — Z515 Encounter for palliative care: Secondary | ICD-10-CM | POA: Diagnosis not present

## 2017-05-31 DIAGNOSIS — R05 Cough: Secondary | ICD-10-CM

## 2017-05-31 DIAGNOSIS — R059 Cough, unspecified: Secondary | ICD-10-CM

## 2017-05-31 DIAGNOSIS — C3412 Malignant neoplasm of upper lobe, left bronchus or lung: Secondary | ICD-10-CM | POA: Diagnosis not present

## 2017-05-31 DIAGNOSIS — T451X5A Adverse effect of antineoplastic and immunosuppressive drugs, initial encounter: Secondary | ICD-10-CM

## 2017-05-31 DIAGNOSIS — R0602 Shortness of breath: Secondary | ICD-10-CM

## 2017-05-31 DIAGNOSIS — R197 Diarrhea, unspecified: Secondary | ICD-10-CM

## 2017-05-31 DIAGNOSIS — J449 Chronic obstructive pulmonary disease, unspecified: Secondary | ICD-10-CM | POA: Diagnosis not present

## 2017-05-31 MED ORDER — HYDROCOD POLST-CPM POLST ER 10-8 MG/5ML PO SUER: 5.0000 mL | Freq: Three times a day (TID) | ORAL | 0 refills | Status: AC | PRN

## 2017-06-02 ENCOUNTER — Encounter: Payer: Self-pay | Admitting: Internal Medicine

## 2017-06-03 ENCOUNTER — Other Ambulatory Visit: Payer: Medicare Other

## 2017-06-03 ENCOUNTER — Ambulatory Visit: Payer: Medicare Other | Admitting: Internal Medicine

## 2017-06-03 ENCOUNTER — Ambulatory Visit: Payer: Medicare Other

## 2017-06-03 ENCOUNTER — Ambulatory Visit: Payer: Medicare Other | Admitting: Pulmonary Disease

## 2017-06-06 ENCOUNTER — Inpatient Hospital Stay: Payer: Medicare Other

## 2017-06-06 ENCOUNTER — Ambulatory Visit: Payer: Medicare Other | Admitting: Internal Medicine

## 2017-06-06 ENCOUNTER — Inpatient Hospital Stay: Payer: Medicare Other | Admitting: Internal Medicine

## 2017-06-06 ENCOUNTER — Encounter: Payer: Self-pay | Admitting: Internal Medicine

## 2017-06-06 ENCOUNTER — Ambulatory Visit: Payer: Medicare Other

## 2017-06-06 ENCOUNTER — Other Ambulatory Visit: Payer: Medicare Other

## 2017-06-06 NOTE — Progress Notes (Deleted)
Pax NOTE  Patient Care Team: Einar Pheasant, MD as PCP - General (Internal Medicine) Wellington Hampshire, MD as Consulting Physician (Cardiology)  CHIEF COMPLAINTS/PURPOSE OF CONSULTATION: Lung cancer  #  Oncology History   # SEP 2017- SQUAMOUS CELL CA [s/p Bronch Dr.S Khan];PET - LEFT UPPER LOBE MASS- left upper lung collpase; mediastinal LN; Contralateral Right nodules [x3];STAGE IV [contralateral lung nodules]  # PDL-1- 90% [Keytruda]; START NOV 2017; June 2018- PROGRESSION  # July 6th 2018- ABRAXANE   # IATROGENIC HYPOTHYROIDISM    # Hx of smoking/ COPD/ CKD [creat ~ 1.5]     Primary cancer of left upper lobe of lung ([place])     HISTORY OF PRESENTING ILLNESS:  Alice Delgado 83 y.o.  female with squamous cell lung cancer stage IVCurrently on second line therapy with Abraxane is here for follow-up.  Patient underwent cycle #2 day 1 Abraxane approximately 1 week days ago.  Patient started noticing hair loss. She also complains of diarrhea that started yesterday; no blood; no cramping in abdomen. She had about 9 loose stools since yesterday. Patient continues to feel poorly; continues to have shortness of breath. Continues to need oxygen 4 L for the clock.   She continues to have cough. No hemoptysis. Notes to have an episode of headache.denies any focal deficits. Continues to feel fatigued.  Continues to have reflux. Continues to have nausea.  ROS: A complete 10 point review of system is done which is negative except mentioned above in history of present illness  MEDICAL HISTORY:  Past Medical History:  Diagnosis Date  . Asthma   . COPD (chronic obstructive pulmonary disease) ([place])   . Diverticulosis   . GERD (gastroesophageal reflux disease)   . Hiatal hernia   . Hypercholesterolemia   . Hyperglycemia   . Hypertension   . Hypothyroidism   . Osteoarthritis    lumbar disc dz, cervical disc dz, hands  . Osteoporosis    intolerance to  fosamax, evista and miacalcin  . Peptic ulcer disease   . Pernicious anemia   . Shortness of breath   . Vitamin D deficiency     SURGICAL HISTORY: Past Surgical History:  Procedure Laterality Date  . ABDOMINAL HYSTERECTOMY     with benign ovarian tumor removed.    Marland Kitchen FLEXIBLE BRONCHOSCOPY N/A 06/16/2016   Procedure: FLEXIBLE BRONCHOSCOPY;  Surgeon: Allyne Gee, MD;  Location: [facility] ORS;  Service: Pulmonary;  Laterality: N/A;  . 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 No alcohol. Social History   Social History  . Marital status: Widowed    Spouse name: N/A  . Number of children: 2  . Years of education: N/A   Occupational History  . Not on file.   Social History Main Topics  . Smoking status: Former Smoker    Years: 15.00    Types: Cigarettes  . Smokeless tobacco: Never Used  . Alcohol use No  . Drug use: No  . Sexual activity: Not on file   Other Topics Concern  . Not on file   Social History Narrative  . No narrative on file    FAMILY HISTORY: Family History  Problem Relation Age of Onset  . Heart disease Father        myocardial infarction  . Heart attack Father   . Cancer Mother        cancer of lymph nodes and kidney  .  Breast cancer Sister   . Colon cancer Neg Hx     ALLERGIES:  is allergic to penicillins; augmentin [amoxicillin-pot clavulanate]; evista [raloxifene]; fosamax [alendronate sodium]; lipitor [atorvastatin]; miralax [polyethylene glycol]; and zetia [ezetimibe].  MEDICATIONS:  Current Outpatient Prescriptions  Medication Sig Dispense Refill  . albuterol (PROAIR HFA) 108 (90 Base) MCG/ACT inhaler Inhale 2 puffs into the lungs every 6 (six) hours as needed for wheezing or shortness of breath. 18 g 5  . albuterol (PROVENTIL) (2.5 MG/3ML) 0.083% nebulizer solution Take 3 mLs (2.5 mg total) by nebulization 2 (two) times daily. DX: EMPHYSEMA DX CODE: J43.8 75 mL 12  . apixaban  (ELIQUIS) 5 MG TABS tablet Take 1 tablet (5 mg total) by mouth 2 (two) times daily. (Patient taking differently: Take 2.5 mg by mouth daily. ) 60 tablet 5  . benzonatate (TESSALON) 200 MG capsule Take 1 capsule (200 mg total) by mouth 3 (three) times daily as needed for cough. 90 capsule 3  . BREO ELLIPTA 100-25 MCG/INH AEPB INHALE 1 PUFF BY MOUTH DAILY 30 each 0  . chlorpheniramine-HYDROcodone (TUSSIONEX) 10-8 MG/5ML SUER Take 5 mLs by mouth every 8 (eight) hours as needed for cough. 473 mL 0  . cyanocobalamin (,VITAMIN B-12,) 1000 MCG/ML injection Inject 1 mL (1,000 mcg total) into the muscle every 30 (thirty) days. 10 mL 1  . esomeprazole (NEXIUM) 40 MG capsule Take 1 capsule (40 mg total) by mouth daily at 12 noon. 30 capsule 6  . HYDROcodone-acetaminophen (VICODIN) 5-325 mg TABS tablet One pill every night as needed. 60 tablet 0  . hydrOXYzine (ATARAX/VISTARIL) 25 MG tablet TAKE 1 TABLET(25 MG) BY MOUTH THREE TIMES DAILY AS NEEDED FOR ITCHING 60 tablet 0  . ipratropium-albuterol (DUONEB) 0.5-2.5 (3) MG/3ML SOLN Take 3 mLs by nebulization as needed.    Marland Kitchen levothyroxine (SYNTHROID, LEVOTHROID) 88 MCG tablet Take 1 tablet (88 mcg total) by mouth daily before breakfast. 30 tablet 4  . metoprolol tartrate (LOPRESSOR) 50 MG tablet TAKE 1 TABLET(50 MG) BY MOUTH TWICE DAILY 60 tablet 3  . montelukast (SINGULAIR) 10 MG tablet Take 1 tablet (10 mg total) by mouth at bedtime. (Patient taking differently: Take 10 mg by mouth every morning. ) 90 tablet 0  . nitroGLYCERIN (NITROSTAT) 0.4 MG SL tablet Place 1 tablet (0.4 mg total) under the tongue every 5 (five) minutes as needed for chest pain. (Patient not taking: Reported on 05/04/2017) 25 tablet 2  . nystatin cream (MYCOSTATIN) Apply 1 application topically 3 (three) times daily.  2  . NYSTATIN powder Apply 1 application topically 4 (four) times daily.  2  . ondansetron (ZOFRAN) 4 MG tablet TK 1 T PO BID PRF NAUSEA  1  . prochlorperazine (COMPAZINE) 10 MG  tablet Take 1 tablet (10 mg total) by mouth every 6 (six) hours as needed for nausea or vomiting. 30 tablet 3  . promethazine (PHENERGAN) 12.5 MG tablet Take 1 tablet (12.5 mg total) by mouth every 6 (six) hours as needed for nausea or vomiting. 30 tablet 3  . spironolactone (ALDACTONE) 25 MG tablet TAKE 1 TABLET BY MOUTH DAILY 30 tablet 6  . Tiotropium Bromide Monohydrate (SPIRIVA RESPIMAT) 2.5 MCG/ACT AERS Inhale 2 puffs into the lungs daily. 4 g 0  . triamcinolone ointment (KENALOG) 0.5 % APPLY EXTERNALLY TO THE AFFECTED AREA TWICE DAILY (Patient not taking: Reported on 05/04/2017) 30 g 0   No current facility-administered medications for this visit.       Marland Kitchen  PHYSICAL EXAMINATION: ECOG PERFORMANCE STATUS:  1 - Symptomatic but completely ambulatory  There were no vitals filed for this visit. There were no vitals filed for this visit.  GENERAL: Elderly Caucasian female patient appears frail.; Alert, no distress and comfortable.  With daughters/ neice.She is in a wheelchair. She is on 4 L of oxygen. She appears to have lost weight.  EYES: no pallor or icterus OROPHARYNX: no thrush or ulceration; good dentition  NECK: supple, no masses felt LYMPH:  no palpable lymphadenopathy in the cervical, axillary or inguinal regions LUNGS: Decreased breath sounds on the left side compared to the right.  HEART/CVS: irregular rhythm and no murmurs; No lower extremity edema ABDOMEN: abdomen soft, non-tender and normal bowel sounds Musculoskeletal:no cyanosis of digits and no clubbing  PSYCH: alert & oriented x 3 with fluent speech NEURO: no focal motor/sensory deficits Skin- multiple ecchymosis. Otherwise no rash.  LABORATORY DATA:  I have reviewed the data as listed Lab Results  Component Value Date   WBC 4.9 05/20/2017   HGB 9.5 (L) 05/20/2017   HCT 28.4 (L) 05/20/2017   MCV 82.1 05/20/2017   PLT 285 05/20/2017    Recent Labs  04/01/17 1405 04/22/17 1338 05/04/17 1315 05/20/17 1256   NA 129* 128* 128* 134*  K 4.2 4.3 4.1 3.8  CL 94* 94* 97* 100*  CO2 _0 GLUCOSE 114* 142* 137* 127*  BUN 19 22* 19 14  CREATININE 1.41* 1.49* 1.37* 1.23*  CALCIUM 9.2 9.1 9.2 8.9  GFRNONAA 33* 31* 34* 39*  GFRAA 38* 35* 39* 45*  PROT 7.2 6.9  --  6.5  ALBUMIN 3.8 3.4*  --  3.3*  AST 17 14*  --  16  ALT 8* 6*  --  7*  ALKPHOS 87 83  --  84  BILITOT 0.5 0.7  --  0.6    RADIOGRAPHIC STUDIES: I have personally reviewed the radiological images as listed and agreed with the findings in the report. No results found.  ASSESSMENT & PLAN:   No problem-specific Assessment & Plan notes found for this encounter.     Cammie Sickle, MD 06/06/2017 8:31 AM

## 2017-06-06 NOTE — Assessment & Plan Note (Deleted)
#   Squamous cell carcinoma of the left upper lobe with atelectasis left upper lung; and also contralateral lung nodules- stage IV lung cancer; currently on second line chemo abraxane cycle #2; day #8  approximately 2 week ago.  # Patient seems to be tolerating chemotherapy with mild-to-moderate difficulties. [C discussion below]. Recommend holding chemotherapy today given the ongoing diarrhea. Discussed with the patient and family that if she continues to have difficulty tolerating treatments- hospice would be recommended. She understands; but wants to wait for 2 more weeks to make any decisions.  # Diarrhea question chemotherapy versus other causes. Recommend Imodium. Also recommend checking C. Difficile/GI stool PCR.  # Chronic nausea- likely secondary to malignancy versus chemotherapy continue antiemetics.  #  Intermittent hemoptysis- likely secondary to underlying malignancy/Eliquis. Improved after reducing the dose of Eliquis 2.5 mg once a day.  # Hypothyroidism- secondary to [place]; on Synthroid 88 g. most recent thyroid profile within normal limits. Continue current dose of Synthroid.  # A. Fib-chronic rate control. On anti-coagulation. See discussion above.   # Chronic respiratory failure- malignancy/COPD-recommend continued oxygen at 4 L.    # Significant debility- needing help with her daily chores. Recommend continued home health visits.  # DNR/DNI form signed. Patient does not want to be resuscitated. She has expressed her wishes explicitly; also family agrees.   # follow up lab/chemo-abraxane- in 2 weeks; HOLD chemo today.

## 2017-06-07 ENCOUNTER — Ambulatory Visit: Payer: Medicare Other

## 2017-06-07 ENCOUNTER — Inpatient Hospital Stay (HOSPITAL_BASED_OUTPATIENT_CLINIC_OR_DEPARTMENT_OTHER): Payer: Medicare Other | Admitting: Internal Medicine

## 2017-06-07 ENCOUNTER — Inpatient Hospital Stay: Payer: Medicare Other

## 2017-06-07 VITALS — BP 103/72 | HR 121 | Temp 98.0°F | Resp 22

## 2017-06-07 DIAGNOSIS — C771 Secondary and unspecified malignant neoplasm of intrathoracic lymph nodes: Secondary | ICD-10-CM

## 2017-06-07 DIAGNOSIS — R197 Diarrhea, unspecified: Secondary | ICD-10-CM

## 2017-06-07 DIAGNOSIS — N189 Chronic kidney disease, unspecified: Secondary | ICD-10-CM

## 2017-06-07 DIAGNOSIS — R110 Nausea: Secondary | ICD-10-CM | POA: Diagnosis not present

## 2017-06-07 DIAGNOSIS — J9610 Chronic respiratory failure, unspecified whether with hypoxia or hypercapnia: Secondary | ICD-10-CM | POA: Diagnosis not present

## 2017-06-07 DIAGNOSIS — Z880 Allergy status to penicillin: Secondary | ICD-10-CM

## 2017-06-07 DIAGNOSIS — R5383 Other fatigue: Secondary | ICD-10-CM | POA: Diagnosis not present

## 2017-06-07 DIAGNOSIS — R05 Cough: Secondary | ICD-10-CM

## 2017-06-07 DIAGNOSIS — Z8051 Family history of malignant neoplasm of kidney: Secondary | ICD-10-CM

## 2017-06-07 DIAGNOSIS — C3412 Malignant neoplasm of upper lobe, left bronchus or lung: Secondary | ICD-10-CM

## 2017-06-07 DIAGNOSIS — I129 Hypertensive chronic kidney disease with stage 1 through stage 4 chronic kidney disease, or unspecified chronic kidney disease: Secondary | ICD-10-CM | POA: Diagnosis not present

## 2017-06-07 DIAGNOSIS — E559 Vitamin D deficiency, unspecified: Secondary | ICD-10-CM

## 2017-06-07 DIAGNOSIS — K5900 Constipation, unspecified: Secondary | ICD-10-CM

## 2017-06-07 DIAGNOSIS — Z808 Family history of malignant neoplasm of other organs or systems: Secondary | ICD-10-CM

## 2017-06-07 DIAGNOSIS — J449 Chronic obstructive pulmonary disease, unspecified: Secondary | ICD-10-CM | POA: Diagnosis not present

## 2017-06-07 DIAGNOSIS — R042 Hemoptysis: Secondary | ICD-10-CM

## 2017-06-07 DIAGNOSIS — R630 Anorexia: Secondary | ICD-10-CM | POA: Diagnosis not present

## 2017-06-07 DIAGNOSIS — M1990 Unspecified osteoarthritis, unspecified site: Secondary | ICD-10-CM

## 2017-06-07 DIAGNOSIS — E871 Hypo-osmolality and hyponatremia: Secondary | ICD-10-CM | POA: Diagnosis not present

## 2017-06-07 DIAGNOSIS — J9811 Atelectasis: Secondary | ICD-10-CM

## 2017-06-07 DIAGNOSIS — Z9981 Dependence on supplemental oxygen: Secondary | ICD-10-CM

## 2017-06-07 DIAGNOSIS — R51 Headache: Secondary | ICD-10-CM | POA: Diagnosis not present

## 2017-06-07 DIAGNOSIS — E032 Hypothyroidism due to medicaments and other exogenous substances: Secondary | ICD-10-CM

## 2017-06-07 DIAGNOSIS — M810 Age-related osteoporosis without current pathological fracture: Secondary | ICD-10-CM

## 2017-06-07 DIAGNOSIS — Z7901 Long term (current) use of anticoagulants: Secondary | ICD-10-CM

## 2017-06-07 DIAGNOSIS — R42 Dizziness and giddiness: Secondary | ICD-10-CM | POA: Diagnosis not present

## 2017-06-07 DIAGNOSIS — D510 Vitamin B12 deficiency anemia due to intrinsic factor deficiency: Secondary | ICD-10-CM | POA: Diagnosis not present

## 2017-06-07 DIAGNOSIS — I4891 Unspecified atrial fibrillation: Secondary | ICD-10-CM

## 2017-06-07 DIAGNOSIS — E7800 Pure hypercholesterolemia, unspecified: Secondary | ICD-10-CM

## 2017-06-07 DIAGNOSIS — Z66 Do not resuscitate: Secondary | ICD-10-CM

## 2017-06-07 DIAGNOSIS — Z9225 Personal history of immunosupression therapy: Secondary | ICD-10-CM

## 2017-06-07 DIAGNOSIS — K219 Gastro-esophageal reflux disease without esophagitis: Secondary | ICD-10-CM

## 2017-06-07 DIAGNOSIS — T451X5A Adverse effect of antineoplastic and immunosuppressive drugs, initial encounter: Secondary | ICD-10-CM

## 2017-06-07 DIAGNOSIS — R0602 Shortness of breath: Secondary | ICD-10-CM

## 2017-06-07 DIAGNOSIS — Z87891 Personal history of nicotine dependence: Secondary | ICD-10-CM

## 2017-06-07 DIAGNOSIS — Z803 Family history of malignant neoplasm of breast: Secondary | ICD-10-CM

## 2017-06-07 DIAGNOSIS — R059 Cough, unspecified: Secondary | ICD-10-CM

## 2017-06-07 DIAGNOSIS — Z79899 Other long term (current) drug therapy: Secondary | ICD-10-CM

## 2017-06-07 LAB — CBC WITH DIFFERENTIAL/PLATELET
BASOS ABS: 0 10*3/uL (ref 0–0.1)
BASOS PCT: 0 %
EOS ABS: 0.4 10*3/uL (ref 0–0.7)
Eosinophils Relative: 4 %
HCT: 29.8 % — ABNORMAL LOW (ref 35.0–47.0)
Hemoglobin: 9.9 g/dL — ABNORMAL LOW (ref 12.0–16.0)
Lymphocytes Relative: 4 %
Lymphs Abs: 0.3 10*3/uL — ABNORMAL LOW (ref 1.0–3.6)
MCH: 26.8 pg (ref 26.0–34.0)
MCHC: 33.1 g/dL (ref 32.0–36.0)
MCV: 80.9 fL (ref 80.0–100.0)
MONO ABS: 0.9 10*3/uL (ref 0.2–0.9)
MONOS PCT: 10 %
Neutro Abs: 7.9 10*3/uL — ABNORMAL HIGH (ref 1.4–6.5)
Neutrophils Relative %: 82 %
PLATELETS: 249 10*3/uL (ref 150–440)
RBC: 3.69 MIL/uL — AB (ref 3.80–5.20)
RDW: 14.9 % — ABNORMAL HIGH (ref 11.5–14.5)
WBC: 9.5 10*3/uL (ref 3.6–11.0)

## 2017-06-07 LAB — COMPREHENSIVE METABOLIC PANEL
ALT: 6 U/L — AB (ref 14–54)
AST: 14 U/L — ABNORMAL LOW (ref 15–41)
Albumin: 3.1 g/dL — ABNORMAL LOW (ref 3.5–5.0)
Alkaline Phosphatase: 105 U/L (ref 38–126)
Anion gap: 8 (ref 5–15)
BUN: 22 mg/dL — ABNORMAL HIGH (ref 6–20)
CHLORIDE: 92 mmol/L — AB (ref 101–111)
CO2: 26 mmol/L (ref 22–32)
CREATININE: 1.35 mg/dL — AB (ref 0.44–1.00)
Calcium: 9 mg/dL (ref 8.9–10.3)
GFR calc non Af Amer: 34 mL/min — ABNORMAL LOW (ref >60)
GFR, EST AFRICAN AMERICAN: 40 mL/min — AB (ref >60)
Glucose, Bld: 140 mg/dL — ABNORMAL HIGH (ref 65–99)
Potassium: 3.9 mmol/L (ref 3.5–5.1)
SODIUM: 126 mmol/L — AB (ref 135–145)
Total Bilirubin: 0.8 mg/dL (ref 0.3–1.2)
Total Protein: 6.4 g/dL — ABNORMAL LOW (ref 6.5–8.1)

## 2017-06-07 MED ORDER — MECLIZINE HCL 12.5 MG PO TABS
12.5000 mg | ORAL_TABLET | Freq: Three times a day (TID) | ORAL | 0 refills | Status: AC | PRN
Start: 2017-06-07 — End: ?

## 2017-06-07 NOTE — Progress Notes (Signed)
Patient here for follow-up with Dr. Rogue Bussing. Reports irregular heartbeat/palpitation. Patient reports cough and post nasal drip.

## 2017-06-07 NOTE — Assessment & Plan Note (Signed)
#   Squamous cell carcinoma of the left upper lobe with atelectasis left upper lung; and also contralateral lung nodules- stage IV lung cancer; currently on second line chemo abraxane cycle #2; day #8  approximately 4 week ago.  # Chronic respiratory failure- malignancy/COPD-recommend continued oxygen at 4 L- worsening secondary to progressive malignancy./C discussion below.  # Given the significant decline in the performance status- I long discussion the patient and family regarding not continuing further chemotherapy. I would recommend hospice at this time.  # Dizziness- question A. fib versus vertigo. Recommend meclizine.  # A. fib with RVR- on metoprolol 50 twice a day. Recommend discontinuation of spironolactone  # Hyponatremia- sodium 126. Likely dehydration/progressive disease. Recommend discontinuation of spirinolactone.  # Constipation- add MiraLAX; enema if needed.   # Patient is DNR/DNI.   # After lengthy discussion patient family agrees to hospice. We'll start hospice referral process ASAP. Follow-up as needed.

## 2017-06-07 NOTE — Progress Notes (Signed)
[place] NOTE  Patient Care Team: Einar Pheasant, MD as PCP - General (Internal Medicine) Wellington Hampshire, MD as Consulting Physician (Cardiology)  CHIEF COMPLAINTS/PURPOSE OF CONSULTATION: Lung cancer  #  Oncology History   # SEP 2017- SQUAMOUS CELL CA [s/p Bronch Dr.S Khan];PET - LEFT UPPER LOBE MASS- left upper lung collpase; mediastinal LN; Contralateral Right nodules [x3];STAGE IV [contralateral lung nodules]  # PDL-1- 90% [Keytruda]; START NOV 2017; June 2018- PROGRESSION  # July 6th 2018- ABRAXANE   # IATROGENIC HYPOTHYROIDISM    # Hx of smoking/ COPD/ CKD [creat ~ 1.5]     Primary cancer of left upper lobe of lung ([place])     HISTORY OF PRESENTING ILLNESS:  Alice Delgado 83 y.o.  female with squamous cell lung cancer stage IV currently on second line therapy with Abraxane is here for follow-up.   Patient continues to feel poorly. Loss of appetite. Worsening shortness of breath. Worsening cough. No hemoptysis. Poor energy levels. Worsening nausea. Dizziness with standing. Spending most of the time resting. No fevers or chills. However feels "cold all the time".   ROS: A complete 10 point review of system is done which is negative except mentioned above in history of present illness  MEDICAL HISTORY:  Past Medical History:  Diagnosis Date  . Asthma   . COPD (chronic obstructive pulmonary disease) ([place])   . Diverticulosis   . GERD (gastroesophageal reflux disease)   . Hiatal hernia   . Hypercholesterolemia   . Hyperglycemia   . Hypertension   . Hypothyroidism   . Osteoarthritis    lumbar disc dz, cervical disc dz, hands  . Osteoporosis    intolerance to fosamax, evista and miacalcin  . Peptic ulcer disease   . Pernicious anemia   . Shortness of breath   . Vitamin D deficiency     SURGICAL HISTORY: Past Surgical History:  Procedure Laterality Date  . ABDOMINAL HYSTERECTOMY     with benign ovarian tumor removed.    Marland Kitchen  FLEXIBLE BRONCHOSCOPY N/A 06/16/2016   Procedure: FLEXIBLE BRONCHOSCOPY;  Surgeon: Allyne Gee, MD;  Location: [facility] ORS;  Service: Pulmonary;  Laterality: N/A;  . LUMBAR FUSION      SOCIAL HISTORY:Retired Network engineer. Lives in [place]- 2 daughters; quit smoking in 1985. No alcohol. Social History   Social History  . Marital status: Widowed    Spouse name: N/A  . Number of children: 2  . Years of education: N/A   Occupational History  . Not on file.   Social History Main Topics  . Smoking status: Former Smoker    Years: 15.00    Types: Cigarettes  . Smokeless tobacco: Never Used  . Alcohol use No  . Drug use: No  . Sexual activity: Not on file   Other Topics Concern  . Not on file   Social History Narrative  . No narrative on file    FAMILY HISTORY: Family History  Problem Relation Age of Onset  . Heart disease Father        myocardial infarction  . Heart attack Father   . Cancer Mother        cancer of lymph nodes and kidney  . Breast cancer Sister   . Colon cancer Neg Hx     ALLERGIES:  is allergic to penicillins; augmentin [amoxicillin-pot clavulanate]; evista [raloxifene]; fosamax [alendronate sodium]; lipitor [atorvastatin]; miralax [polyethylene glycol]; and zetia [ezetimibe].  MEDICATIONS:  Current Outpatient Prescriptions  Medication Sig Dispense Refill  .  albuterol (PROAIR HFA) 108 (90 Base) MCG/ACT inhaler Inhale 2 puffs into the lungs every 6 (six) hours as needed for wheezing or shortness of breath. 18 g 5  . albuterol (PROVENTIL) (2.5 MG/3ML) 0.083% nebulizer solution Take 3 mLs (2.5 mg total) by nebulization 2 (two) times daily. DX: EMPHYSEMA DX CODE: J43.8 75 mL 12  . apixaban (ELIQUIS) 5 MG TABS tablet Take 1 tablet (5 mg total) by mouth 2 (two) times daily. (Patient taking differently: Take 2.5 mg by mouth daily. ) 60 tablet 5  . BREO ELLIPTA 100-25 MCG/INH AEPB INHALE 1 PUFF BY MOUTH DAILY 30 each 0  . chlorpheniramine-HYDROcodone (TUSSIONEX) 10-8  MG/5ML SUER Take 5 mLs by mouth every 8 (eight) hours as needed for cough. 473 mL 0  . cyanocobalamin (,VITAMIN B-12,) 1000 MCG/ML injection Inject 1 mL (1,000 mcg total) into the muscle every 30 (thirty) days. 10 mL 1  . esomeprazole (NEXIUM) 40 MG capsule Take 1 capsule (40 mg total) by mouth daily at 12 noon. 30 capsule 6  . HYDROcodone-acetaminophen (VICODIN) 5-325 mg TABS tablet One pill every night as needed. 60 tablet 0  . hydrOXYzine (ATARAX/VISTARIL) 25 MG tablet TAKE 1 TABLET(25 MG) BY MOUTH THREE TIMES DAILY AS NEEDED FOR ITCHING 60 tablet 0  . ipratropium-albuterol (DUONEB) 0.5-2.5 (3) MG/3ML SOLN Take 3 mLs by nebulization as needed.    Marland Kitchen levothyroxine (SYNTHROID, LEVOTHROID) 88 MCG tablet Take 1 tablet (88 mcg total) by mouth daily before breakfast. 30 tablet 4  . metoprolol tartrate (LOPRESSOR) 50 MG tablet TAKE 1 TABLET(50 MG) BY MOUTH TWICE DAILY 60 tablet 3  . ondansetron (ZOFRAN) 4 MG tablet TK 1 T PO BID PRF NAUSEA  1  . Sodium Phosphates (FLEET ENEMA RE) Place 1 suppository rectally once as needed.    Marland Kitchen spironolactone (ALDACTONE) 25 MG tablet TAKE 1 TABLET BY MOUTH DAILY 30 tablet 6  . benzonatate (TESSALON) 200 MG capsule Take 1 capsule (200 mg total) by mouth 3 (three) times daily as needed for cough. (Patient not taking: Reported on 06/07/2017) 90 capsule 3  . meclizine (ANTIVERT) 12.5 MG tablet Take 1 tablet (12.5 mg total) by mouth 3 (three) times daily as needed for dizziness. 30 tablet 0  . montelukast (SINGULAIR) 10 MG tablet Take 1 tablet (10 mg total) by mouth at bedtime. (Patient not taking: Reported on 06/07/2017) 90 tablet 0  . nitroGLYCERIN (NITROSTAT) 0.4 MG SL tablet Place 1 tablet (0.4 mg total) under the tongue every 5 (five) minutes as needed for chest pain. (Patient not taking: Reported on 05/04/2017) 25 tablet 2  . nystatin cream (MYCOSTATIN) Apply 1 application topically 3 (three) times daily.  2  . NYSTATIN powder Apply 1 application topically 4 (four) times  daily.  2  . prochlorperazine (COMPAZINE) 10 MG tablet Take 1 tablet (10 mg total) by mouth every 6 (six) hours as needed for nausea or vomiting. (Patient not taking: Reported on 06/07/2017) 30 tablet 3  . promethazine (PHENERGAN) 12.5 MG tablet Take 1 tablet (12.5 mg total) by mouth every 6 (six) hours as needed for nausea or vomiting. (Patient not taking: Reported on 06/07/2017) 30 tablet 3  . Tiotropium Bromide Monohydrate (SPIRIVA RESPIMAT) 2.5 MCG/ACT AERS Inhale 2 puffs into the lungs daily. (Patient not taking: Reported on 06/07/2017) 4 g 0   No current facility-administered medications for this visit.       Marland Kitchen  PHYSICAL EXAMINATION: ECOG PERFORMANCE STATUS: 3 - Symptomatic, >50% confined to bed  Vitals:   06/07/17  1345  BP: 103/72  Pulse: (!) 121  Resp: (!) 22  Temp: 98 F (36.7 C)   There were no vitals filed for this visit.  GENERAL: Elderly Caucasian female patient appears frail.; Alert, no distress and comfortable.  With daughters/ neice.She is in a wheelchair. She is on 4 L of oxygen. She appears to have lost weight.  EYES: no pallor or icterus OROPHARYNX: no thrush or ulceration; good dentition  NECK: supple, no masses felt LYMPH:  no palpable lymphadenopathy in the cervical, axillary or inguinal regions LUNGS: Decreased breath sounds on the left side compared to the right.  HEART/CVS: irregular rhythm and no murmurs; No lower extremity edema ABDOMEN: abdomen soft, non-tender and normal bowel sounds Musculoskeletal:no cyanosis of digits and no clubbing  PSYCH: alert & oriented x 3 with fluent speech NEURO: no focal motor/sensory deficits Skin- multiple ecchymosis. Otherwise no rash.  LABORATORY DATA:  I have reviewed the data as listed Lab Results  Component Value Date   WBC 9.5 06/07/2017   HGB 9.9 (L) 06/07/2017   HCT 29.8 (L) 06/07/2017   MCV 80.9 06/07/2017   PLT 249 06/07/2017    Recent Labs  04/22/17 1338 05/04/17 1315 05/20/17 1256 06/07/17 1318   NA 128* 128* 134* 126*  K 4.3 4.1 3.8 3.9  CL 94* 97* 100* 92*  CO2 25 23 25 26   GLUCOSE 142* 137* 127* 140*  BUN 22* 19 14 22*  CREATININE 1.49* 1.37* 1.23* 1.35*  CALCIUM 9.1 9.2 8.9 9.0  GFRNONAA 31* 34* 39* 34*  GFRAA 35* 39* 45* 40*  PROT 6.9  --  6.5 6.4*  ALBUMIN 3.4*  --  3.3* 3.1*  AST 14*  --  16 14*  ALT 6*  --  7* 6*  ALKPHOS 83  --  84 105  BILITOT 0.7  --  0.6 0.8    RADIOGRAPHIC STUDIES: I have personally reviewed the radiological images as listed and agreed with the findings in the report. No results found.  ASSESSMENT & PLAN:   Primary cancer of left upper lobe of lung ([place])  # Squamous cell carcinoma of the left upper lobe with atelectasis left upper lung; and also contralateral lung nodules- stage IV lung cancer; currently on second line chemo abraxane cycle #2; day #8  approximately 4 week ago.  # Chronic respiratory failure- malignancy/COPD-recommend continued oxygen at 4 L- worsening secondary to progressive malignancy./C discussion below.  # Given the significant decline in the performance status- I long discussion the patient and family regarding not continuing further chemotherapy. I would recommend hospice at this time.  # Dizziness- question A. fib versus vertigo. Recommend meclizine.  # A. fib with RVR- on metoprolol 50 twice a day. Recommend discontinuation of spironolactone  # Hyponatremia- sodium 126. Likely dehydration/progressive disease. Recommend discontinuation of spirinolactone.  # Constipation- add MiraLAX; enema if needed.   # Patient is DNR/DNI.   # After lengthy discussion patient family agrees to hospice. We'll start hospice referral process ASAP. Follow-up as needed.      Cammie Sickle, MD 06/07/2017 3:14 PM

## 2017-06-11 ENCOUNTER — Other Ambulatory Visit: Payer: Self-pay | Admitting: Internal Medicine

## 2017-06-11 ENCOUNTER — Encounter: Payer: Self-pay | Admitting: Internal Medicine

## 2017-06-11 DIAGNOSIS — R05 Cough: Secondary | ICD-10-CM | POA: Diagnosis not present

## 2017-06-11 DIAGNOSIS — J449 Chronic obstructive pulmonary disease, unspecified: Secondary | ICD-10-CM | POA: Diagnosis not present

## 2017-06-11 DIAGNOSIS — C3412 Malignant neoplasm of upper lobe, left bronchus or lung: Secondary | ICD-10-CM

## 2017-06-13 ENCOUNTER — Other Ambulatory Visit: Payer: Self-pay | Admitting: *Deleted

## 2017-06-13 DIAGNOSIS — R112 Nausea with vomiting, unspecified: Secondary | ICD-10-CM

## 2017-06-13 DIAGNOSIS — C3412 Malignant neoplasm of upper lobe, left bronchus or lung: Secondary | ICD-10-CM

## 2017-06-13 MED ORDER — HYDROXYZINE HCL 25 MG PO TABS: 25.0000 mg | ORAL_TABLET | Freq: Three times a day (TID) | ORAL | 0 refills | Status: AC | PRN

## 2017-06-13 MED ORDER — ONDANSETRON HCL 4 MG PO TABS: 4.0000 mg | ORAL_TABLET | Freq: Three times a day (TID) | ORAL | 3 refills | Status: AC | PRN

## 2017-06-19 ENCOUNTER — Other Ambulatory Visit: Payer: Self-pay | Admitting: Oncology

## 2017-06-19 MED ORDER — MORPHINE SULFATE (CONCENTRATE) 20 MG/ML PO SOLN: ORAL | 0 refills | Status: AC

## 2017-06-19 MED ORDER — MORPHINE SULFATE (CONCENTRATE) 20 MG/ML PO SOLN: ORAL | 0 refills | Status: DC

## 2017-06-19 NOTE — Progress Notes (Signed)
Called by Hospice Staff Mariann Laster that patient's morphine will ran out tomorrow. Asked to have refill of Morphine 20mg /ml 0.5-60ml Q2h PRN SOB or pain and fax to [facility] at [phone].

## 2017-07-01 DIAGNOSIS — J449 Chronic obstructive pulmonary disease, unspecified: Secondary | ICD-10-CM | POA: Diagnosis not present

## 2017-07-01 DIAGNOSIS — R05 Cough: Secondary | ICD-10-CM | POA: Diagnosis not present

## 2017-07-08 ENCOUNTER — Ambulatory Visit: Payer: Medicare Other | Admitting: Internal Medicine

## 2017-07-16 IMAGING — MR MR HEAD WO/W CM
13 series · 48 of 48 positions shown · IV contrast (multihance)
Comparison: None.

CLINICAL DATA: 87-year-old hypertensive female with lung cancer.
Staging. Initial encounter.

EXAM:
MRI HEAD WITHOUT AND WITH CONTRAST
TECHNIQUE: Multiplanar, multiecho pulse sequences of the brain and surrounding
structures were obtained without and with intravenous contrast.
CONTRAST:  10mL MULTIHANCE GADOBENATE DIMEGLUMINE 529 MG/ML IV SOLN

[Series 2: T1 · sagittal · 5.0mm · 0.45mm/px · 2 of 23 slices shown (1 of 2)]
[im 1/23]
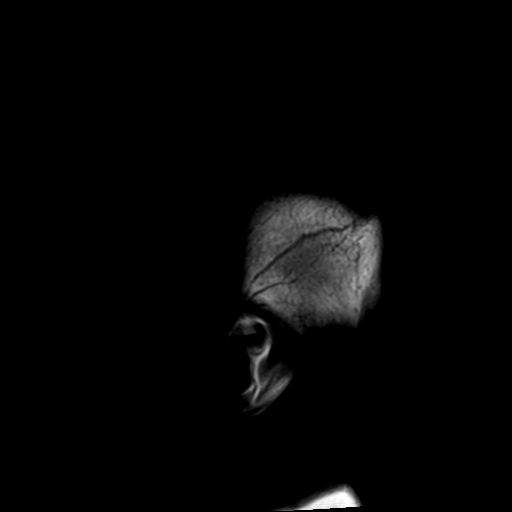
[im 23/23]
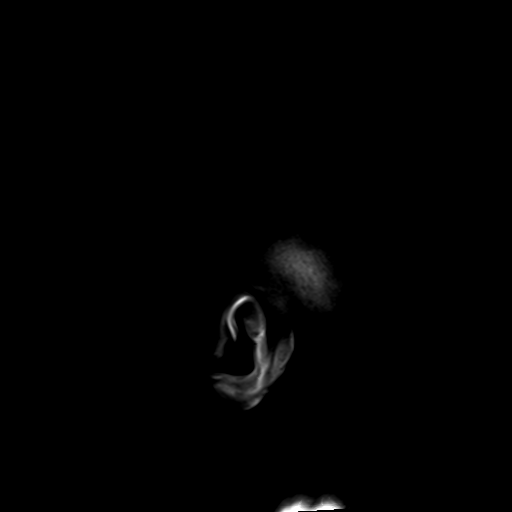

[Series 4: DWI · axial · 3.0mm · 1.20mm/px · z∈[-46,+93]mm · 5 of 53 slices shown (1 of 4)]
[im 1/53]
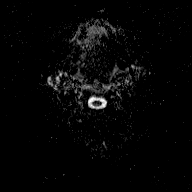
[im 14/53]
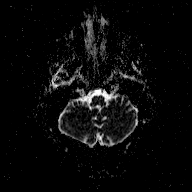
[im 27/53]
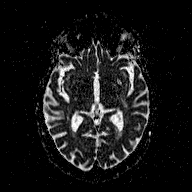
[im 40/53]
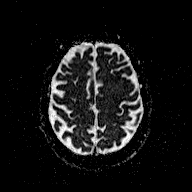
[im 53/53]
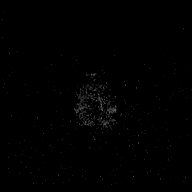

[Series 5: T2 · axial · 5.0mm · 0.72mm/px · z∈[-43,+91]mm · 2 of 24 slices shown (1 of 2)]
[im 1/24]
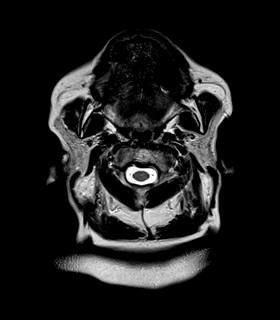
[im 24/24]
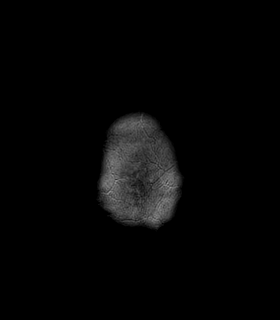

[Series 6: FLAIR · axial · 5.0mm · 0.45mm/px · z∈[-42,+92]mm · 2 of 24 slices shown]
[im 1/24]
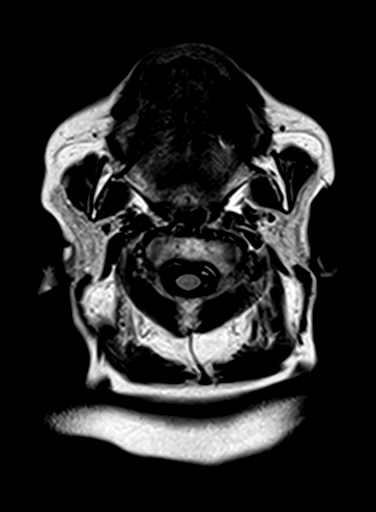
[im 24/24]
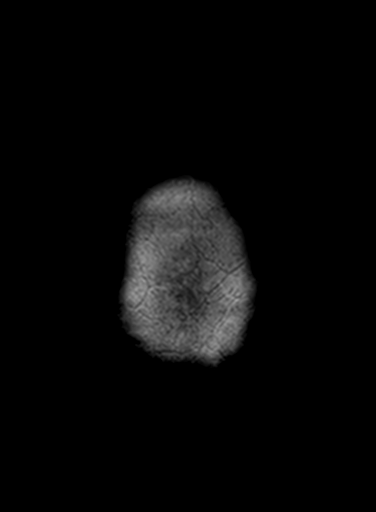

[Series 7: T2 · axial · 5.0mm · 0.72mm/px · z∈[-42,+92]mm · 2 of 24 slices shown (2 of 2)]
[im 1/24]
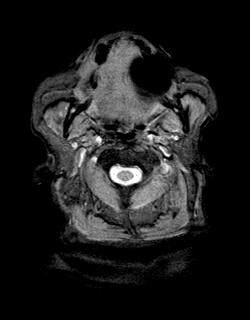
[im 24/24]
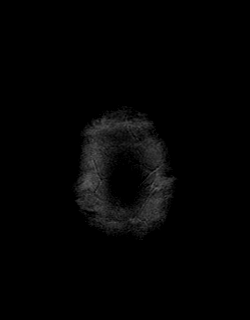

[Series 8: T1 · axial · 3.0mm · 1.00mm/px · z∈[-43,+105]mm · 6 of 56 slices shown (2 of 2)]
[im 1/56]
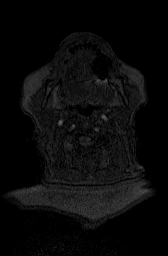
[im 12/56]
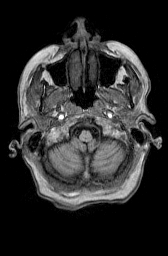
[im 23/56]
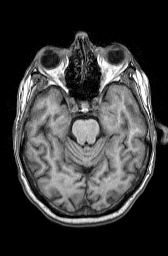
[im 34/56]
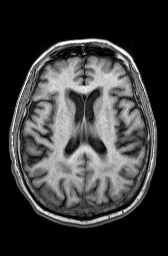
[im 45/56]
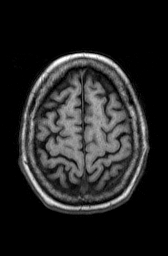
[im 56/56]
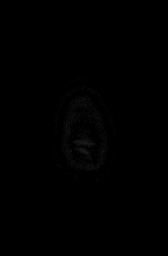

[Series 10: DWI · coronal · 3.0mm · 1.20mm/px · 5 of 46 slices shown (2 of 4)]
[im 1/46]
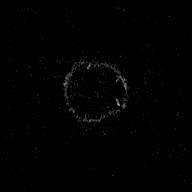
[im 12/46]
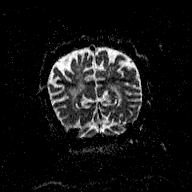
[im 23/46]
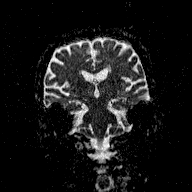
[im 34/46]
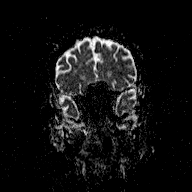
[im 46/46]
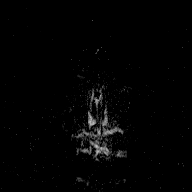

[Series 11: T2 post-contrast · coronal · 5.0mm · 0.45mm/px · 3 of 27 slices shown]
[im 1/27]
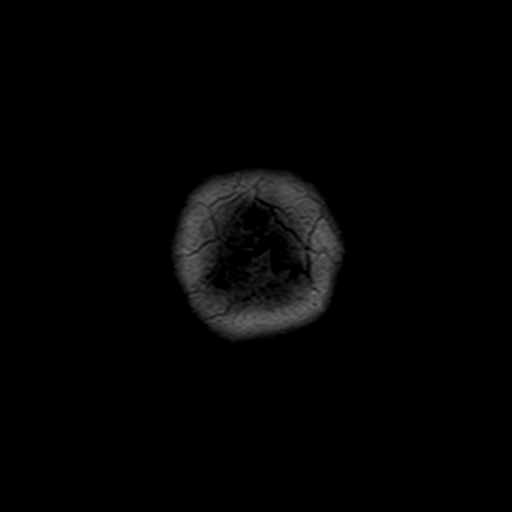
[im 14/27]
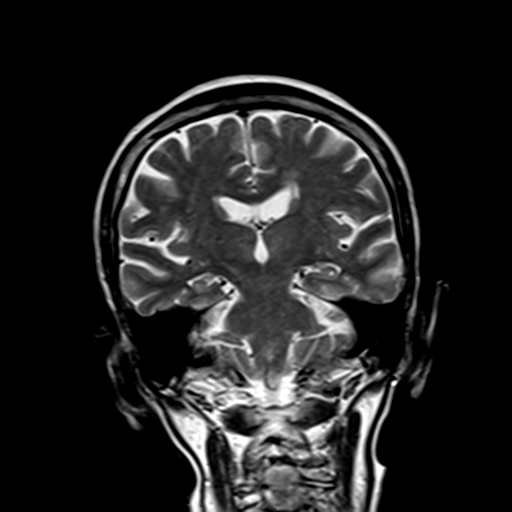
[im 27/27]
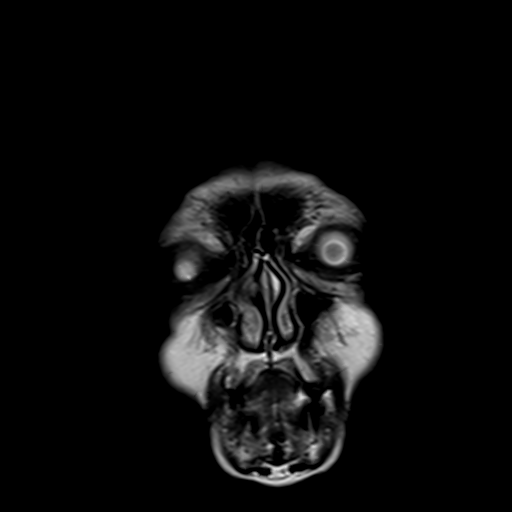

[Series 12: T1 post-contrast · axial · 3.0mm · 1.00mm/px · z∈[-43,+105]mm · 6 of 56 slices shown (1 of 3)]
[im 1/56]
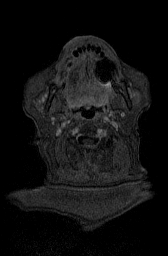
[im 12/56]
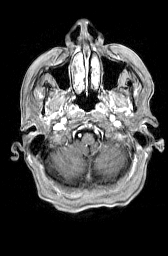
[im 23/56]
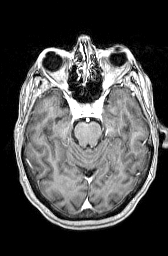
[im 34/56]
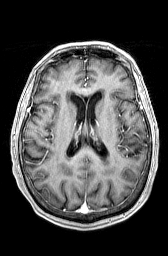
[im 45/56]
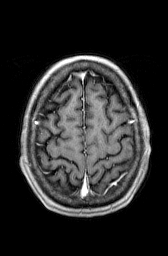
[im 56/56]
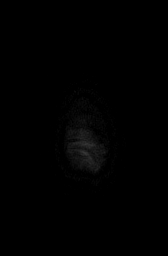

[Series 13: T1 post-contrast · coronal · 5.0mm · 0.45mm/px · 3 of 27 slices shown (2 of 3)]
[im 1/27]
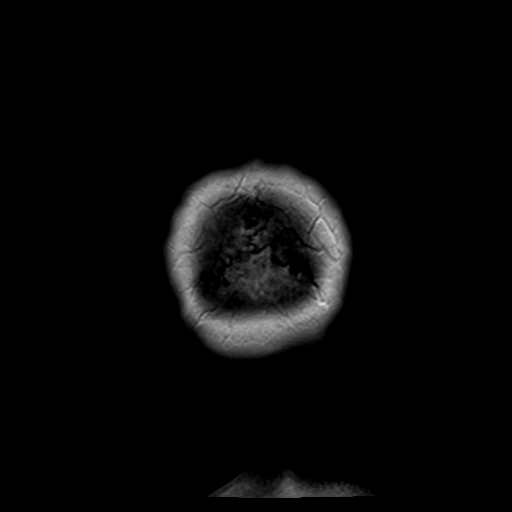
[im 14/27]
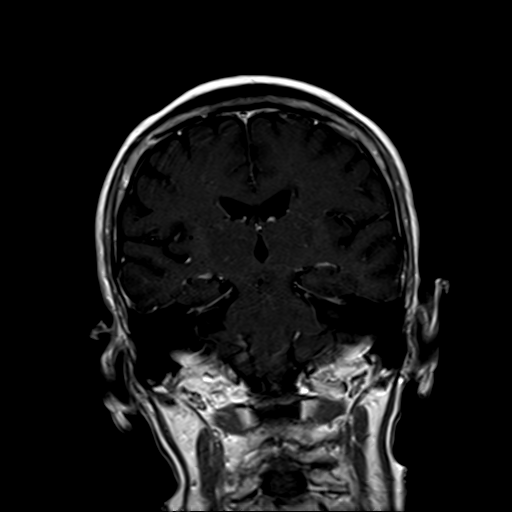
[im 27/27]
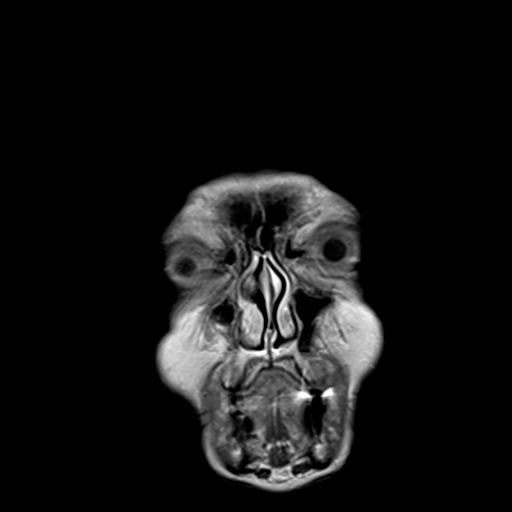

[Series 14: T1 post-contrast · sagittal · 5.0mm · 0.45mm/px · 2 of 23 slices shown (3 of 3)]
[im 1/23]
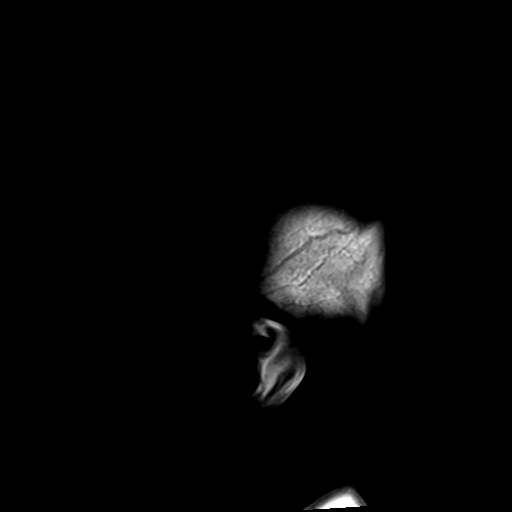
[im 23/23]
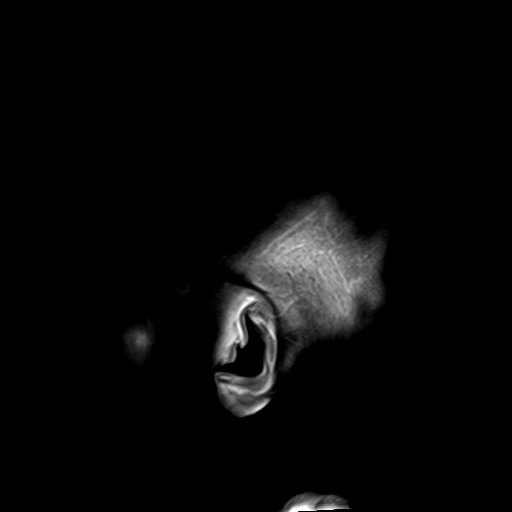

[Series 100: DWI · axial · 3.0mm · 1.20mm/px · z∈[-46,+93]mm · 5 of 53 slices shown (3 of 4)]
[im 1/53]
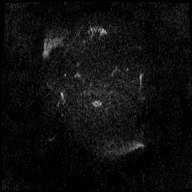
[im 14/53]
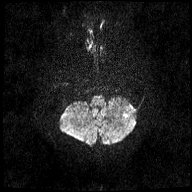
[im 27/53]
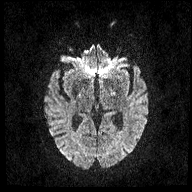
[im 40/53]
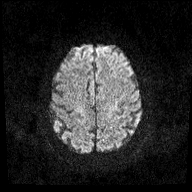
[im 53/53]
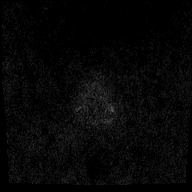

[Series 101: DWI · coronal · 3.0mm · 1.20mm/px · 5 of 46 slices shown (4 of 4)]
[im 1/46]
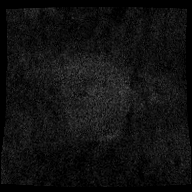
[im 12/46]
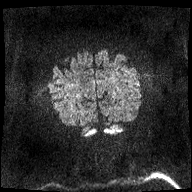
[im 23/46]
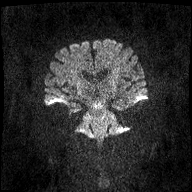
[im 34/46]
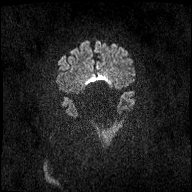
[im 46/46]
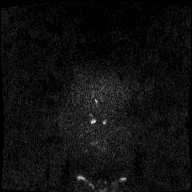

[48 of 48 positions shown; findings below may reference images not displayed]

FINDINGS: Brain: Small area of restricted motion left cerebellum. Question
small infarct versus nonenhancing metastatic lesion. No other
evidence of acute infarct or intracranial metastatic disease.

No intracranial hemorrhage.

Moderate chronic microvascular changes.

Mild global atrophy without hydrocephalus.

Vascular: Major intracranial vascular structures are patent.

Skull and upper cervical spine: Cervical spondylotic changes with
spinal stenosis and mild cord flattening C2-3 and C3-4.

Sinuses/Orbits: Post right lens replacement. No acute orbital
abnormality. Minimal mucosal thickening ethmoid sinus air cells.

Other: Negative
IMPRESSION: Small area of restricted motion left cerebellum. Question small
infarct versus nonenhancing metastatic lesion. No other evidence of
acute infarct or intracranial metastatic disease.

Moderate chronic microvascular changes.

Mild global atrophy without hydrocephalus.

Cervical spondylotic changes with spinal stenosis and mild cord
flattening C2-3 and C3-4.

## 2017-07-18 DEATH — deceased

## 2017-07-29 ENCOUNTER — Other Ambulatory Visit: Payer: Self-pay | Admitting: Nurse Practitioner

## 2017-09-23 NOTE — Telephone Encounter (Signed)
x
